# Patient Record
Sex: Female | Born: 1945 | Race: White | Hispanic: No | Marital: Married | Smoking: Never smoker
Health system: Southern US, Community
[De-identification: ages and names within clinical notes are randomized; demographics above are authoritative.]

## PROBLEM LIST (undated history)

## (undated) DIAGNOSIS — I10 Essential (primary) hypertension: Secondary | ICD-10-CM

## (undated) DIAGNOSIS — D849 Immunodeficiency, unspecified: Secondary | ICD-10-CM

## (undated) DIAGNOSIS — D899 Disorder involving the immune mechanism, unspecified: Secondary | ICD-10-CM

## (undated) DIAGNOSIS — G8929 Other chronic pain: Secondary | ICD-10-CM

## (undated) DIAGNOSIS — R350 Frequency of micturition: Secondary | ICD-10-CM

## (undated) DIAGNOSIS — Z8612 Personal history of poliomyelitis: Secondary | ICD-10-CM

## (undated) DIAGNOSIS — M5136 Other intervertebral disc degeneration, lumbar region: Secondary | ICD-10-CM

## (undated) DIAGNOSIS — Z950 Presence of cardiac pacemaker: Secondary | ICD-10-CM

## (undated) DIAGNOSIS — O020 Blighted ovum and nonhydatidiform mole: Secondary | ICD-10-CM

## (undated) DIAGNOSIS — M51369 Other intervertebral disc degeneration, lumbar region without mention of lumbar back pain or lower extremity pain: Secondary | ICD-10-CM

## (undated) DIAGNOSIS — R112 Nausea with vomiting, unspecified: Secondary | ICD-10-CM

## (undated) DIAGNOSIS — Z9889 Other specified postprocedural states: Secondary | ICD-10-CM

## (undated) DIAGNOSIS — K121 Other forms of stomatitis: Secondary | ICD-10-CM

## (undated) DIAGNOSIS — Z8489 Family history of other specified conditions: Secondary | ICD-10-CM

## (undated) DIAGNOSIS — M1990 Unspecified osteoarthritis, unspecified site: Secondary | ICD-10-CM

## (undated) DIAGNOSIS — S4290XA Fracture of unspecified shoulder girdle, part unspecified, initial encounter for closed fracture: Secondary | ICD-10-CM

## (undated) DIAGNOSIS — K589 Irritable bowel syndrome without diarrhea: Secondary | ICD-10-CM

## (undated) DIAGNOSIS — S62102A Fracture of unspecified carpal bone, left wrist, initial encounter for closed fracture: Secondary | ICD-10-CM

## (undated) DIAGNOSIS — S62101A Fracture of unspecified carpal bone, right wrist, initial encounter for closed fracture: Secondary | ICD-10-CM

## (undated) DIAGNOSIS — G629 Polyneuropathy, unspecified: Secondary | ICD-10-CM

## (undated) DIAGNOSIS — J189 Pneumonia, unspecified organism: Secondary | ICD-10-CM

## (undated) DIAGNOSIS — Z941 Heart transplant status: Secondary | ICD-10-CM

## (undated) HISTORY — DX: Personal history of poliomyelitis: Z86.12

## (undated) HISTORY — PX: SHOULDER ARTHROSCOPY: SHX128

## (undated) HISTORY — DX: Fracture of unspecified carpal bone, left wrist, initial encounter for closed fracture: S62.102A

## (undated) HISTORY — DX: Fracture of unspecified carpal bone, right wrist, initial encounter for closed fracture: S62.101A

## (undated) HISTORY — PX: COLONOSCOPY: SHX174

## (undated) HISTORY — PX: PACEMAKER INSERTION: SHX728

## (undated) HISTORY — PX: APPENDECTOMY: SHX54

## (undated) HISTORY — PX: ELBOW ARTHROPLASTY: SHX928

---

## 1975-09-26 HISTORY — PX: EAR EXAMINATION UNDER ANESTHESIA: SHX1482

## 1986-09-25 HISTORY — PX: DILATION AND CURETTAGE OF UTERUS: SHX78

## 1988-09-25 HISTORY — PX: TUBAL LIGATION: SHX77

## 1990-09-25 HISTORY — PX: SEPTOPLASTY: SUR1290

## 1996-09-25 HISTORY — PX: KNEE ARTHROSCOPY: SUR90

## 1997-12-08 ENCOUNTER — Inpatient Hospital Stay (HOSPITAL_COMMUNITY): Admission: AD | Admit: 1997-12-08 | Discharge: 1997-12-08 | Payer: Self-pay | Admitting: Obstetrics and Gynecology

## 1997-12-24 ENCOUNTER — Ambulatory Visit (HOSPITAL_BASED_OUTPATIENT_CLINIC_OR_DEPARTMENT_OTHER): Admission: RE | Admit: 1997-12-24 | Discharge: 1997-12-24 | Payer: Self-pay | Admitting: Orthopedic Surgery

## 1998-01-28 ENCOUNTER — Ambulatory Visit (HOSPITAL_COMMUNITY): Admission: RE | Admit: 1998-01-28 | Discharge: 1998-01-28 | Payer: Self-pay | Admitting: Neurosurgery

## 1998-08-16 ENCOUNTER — Ambulatory Visit (HOSPITAL_COMMUNITY): Admission: RE | Admit: 1998-08-16 | Discharge: 1998-08-16 | Payer: Self-pay | Admitting: Neurosurgery

## 1998-08-16 ENCOUNTER — Encounter: Payer: Self-pay | Admitting: Neurosurgery

## 1998-08-30 ENCOUNTER — Ambulatory Visit (HOSPITAL_COMMUNITY): Admission: RE | Admit: 1998-08-30 | Discharge: 1998-08-30 | Payer: Self-pay | Admitting: Neurosurgery

## 1998-08-30 ENCOUNTER — Encounter: Payer: Self-pay | Admitting: Neurosurgery

## 1998-09-14 ENCOUNTER — Ambulatory Visit (HOSPITAL_COMMUNITY): Admission: RE | Admit: 1998-09-14 | Discharge: 1998-09-14 | Payer: Self-pay | Admitting: Neurosurgery

## 1998-09-14 ENCOUNTER — Encounter: Payer: Self-pay | Admitting: Neurosurgery

## 1998-10-01 ENCOUNTER — Ambulatory Visit (HOSPITAL_COMMUNITY): Admission: RE | Admit: 1998-10-01 | Discharge: 1998-10-01 | Payer: Self-pay | Admitting: Neurosurgery

## 1998-10-01 ENCOUNTER — Encounter: Payer: Self-pay | Admitting: Neurosurgery

## 1998-10-15 ENCOUNTER — Ambulatory Visit (HOSPITAL_COMMUNITY): Admission: RE | Admit: 1998-10-15 | Discharge: 1998-10-15 | Payer: Self-pay | Admitting: Neurosurgery

## 1998-10-15 ENCOUNTER — Encounter: Payer: Self-pay | Admitting: Neurosurgery

## 1999-05-03 ENCOUNTER — Ambulatory Visit (HOSPITAL_COMMUNITY): Admission: RE | Admit: 1999-05-03 | Discharge: 1999-05-03 | Payer: Self-pay | Admitting: Obstetrics and Gynecology

## 1999-05-03 ENCOUNTER — Encounter: Payer: Self-pay | Admitting: Obstetrics and Gynecology

## 1999-05-05 ENCOUNTER — Encounter: Payer: Self-pay | Admitting: Obstetrics and Gynecology

## 1999-05-05 ENCOUNTER — Ambulatory Visit (HOSPITAL_COMMUNITY): Admission: RE | Admit: 1999-05-05 | Discharge: 1999-05-05 | Payer: Self-pay

## 1999-11-21 ENCOUNTER — Ambulatory Visit (HOSPITAL_COMMUNITY): Admission: RE | Admit: 1999-11-21 | Discharge: 1999-11-21 | Payer: Self-pay | Admitting: Obstetrics and Gynecology

## 1999-11-21 ENCOUNTER — Encounter: Payer: Self-pay | Admitting: Obstetrics and Gynecology

## 2000-03-02 ENCOUNTER — Ambulatory Visit (HOSPITAL_COMMUNITY): Admission: RE | Admit: 2000-03-02 | Discharge: 2000-03-02 | Payer: Self-pay | Admitting: Neurosurgery

## 2000-03-02 ENCOUNTER — Encounter: Payer: Self-pay | Admitting: Neurosurgery

## 2000-03-15 ENCOUNTER — Encounter: Payer: Self-pay | Admitting: Neurosurgery

## 2000-03-15 ENCOUNTER — Ambulatory Visit (HOSPITAL_COMMUNITY): Admission: RE | Admit: 2000-03-15 | Discharge: 2000-03-15 | Payer: Self-pay | Admitting: Neurosurgery

## 2000-03-24 ENCOUNTER — Encounter: Payer: Self-pay | Admitting: Emergency Medicine

## 2000-03-24 ENCOUNTER — Emergency Department (HOSPITAL_COMMUNITY): Admission: EM | Admit: 2000-03-24 | Discharge: 2000-03-24 | Payer: Self-pay | Admitting: Emergency Medicine

## 2000-03-29 ENCOUNTER — Ambulatory Visit (HOSPITAL_COMMUNITY): Admission: RE | Admit: 2000-03-29 | Discharge: 2000-03-29 | Payer: Self-pay | Admitting: Neurosurgery

## 2000-03-29 ENCOUNTER — Encounter: Payer: Self-pay | Admitting: Neurosurgery

## 2001-01-17 ENCOUNTER — Ambulatory Visit (HOSPITAL_BASED_OUTPATIENT_CLINIC_OR_DEPARTMENT_OTHER): Admission: RE | Admit: 2001-01-17 | Discharge: 2001-01-17 | Payer: Self-pay | Admitting: Orthopedic Surgery

## 2001-07-05 ENCOUNTER — Other Ambulatory Visit: Admission: RE | Admit: 2001-07-05 | Discharge: 2001-07-05 | Payer: Self-pay | Admitting: Obstetrics and Gynecology

## 2001-09-25 HISTORY — PX: CARDIAC CATHETERIZATION: SHX172

## 2002-06-18 ENCOUNTER — Encounter: Payer: Self-pay | Admitting: Cardiovascular Disease

## 2002-06-19 ENCOUNTER — Encounter (INDEPENDENT_AMBULATORY_CARE_PROVIDER_SITE_OTHER): Payer: Self-pay | Admitting: *Deleted

## 2002-06-19 ENCOUNTER — Encounter: Payer: Self-pay | Admitting: Thoracic Surgery (Cardiothoracic Vascular Surgery)

## 2002-06-19 ENCOUNTER — Inpatient Hospital Stay (HOSPITAL_COMMUNITY): Admission: EM | Admit: 2002-06-19 | Discharge: 2002-07-06 | Payer: Self-pay | Admitting: Emergency Medicine

## 2002-06-20 ENCOUNTER — Encounter: Payer: Self-pay | Admitting: Thoracic Surgery (Cardiothoracic Vascular Surgery)

## 2002-06-21 ENCOUNTER — Encounter: Payer: Self-pay | Admitting: Thoracic Surgery (Cardiothoracic Vascular Surgery)

## 2002-06-22 ENCOUNTER — Encounter: Payer: Self-pay | Admitting: Thoracic Surgery (Cardiothoracic Vascular Surgery)

## 2002-06-23 ENCOUNTER — Encounter (INDEPENDENT_AMBULATORY_CARE_PROVIDER_SITE_OTHER): Payer: Self-pay | Admitting: Cardiology

## 2002-06-23 ENCOUNTER — Encounter: Payer: Self-pay | Admitting: Thoracic Surgery (Cardiothoracic Vascular Surgery)

## 2002-06-24 ENCOUNTER — Encounter: Payer: Self-pay | Admitting: Thoracic Surgery (Cardiothoracic Vascular Surgery)

## 2002-06-25 ENCOUNTER — Encounter: Payer: Self-pay | Admitting: Thoracic Surgery (Cardiothoracic Vascular Surgery)

## 2002-06-26 ENCOUNTER — Encounter: Payer: Self-pay | Admitting: Thoracic Surgery (Cardiothoracic Vascular Surgery)

## 2002-06-27 ENCOUNTER — Encounter: Payer: Self-pay | Admitting: Thoracic Surgery (Cardiothoracic Vascular Surgery)

## 2002-06-28 ENCOUNTER — Encounter: Payer: Self-pay | Admitting: Thoracic Surgery (Cardiothoracic Vascular Surgery)

## 2002-06-29 ENCOUNTER — Encounter: Payer: Self-pay | Admitting: Thoracic Surgery (Cardiothoracic Vascular Surgery)

## 2002-06-30 ENCOUNTER — Encounter: Payer: Self-pay | Admitting: Cardiothoracic Surgery

## 2002-06-30 ENCOUNTER — Encounter (INDEPENDENT_AMBULATORY_CARE_PROVIDER_SITE_OTHER): Payer: Self-pay | Admitting: Cardiology

## 2002-06-30 ENCOUNTER — Encounter: Payer: Self-pay | Admitting: Thoracic Surgery (Cardiothoracic Vascular Surgery)

## 2002-07-01 ENCOUNTER — Encounter: Payer: Self-pay | Admitting: Thoracic Surgery (Cardiothoracic Vascular Surgery)

## 2002-07-01 ENCOUNTER — Encounter (INDEPENDENT_AMBULATORY_CARE_PROVIDER_SITE_OTHER): Payer: Self-pay | Admitting: Specialist

## 2002-07-02 ENCOUNTER — Encounter: Payer: Self-pay | Admitting: Thoracic Surgery (Cardiothoracic Vascular Surgery)

## 2002-07-03 ENCOUNTER — Encounter: Payer: Self-pay | Admitting: Thoracic Surgery (Cardiothoracic Vascular Surgery)

## 2002-07-04 ENCOUNTER — Encounter (INDEPENDENT_AMBULATORY_CARE_PROVIDER_SITE_OTHER): Payer: Self-pay | Admitting: *Deleted

## 2002-07-04 ENCOUNTER — Encounter: Payer: Self-pay | Admitting: Critical Care Medicine

## 2002-07-05 ENCOUNTER — Encounter: Payer: Self-pay | Admitting: Thoracic Surgery (Cardiothoracic Vascular Surgery)

## 2002-07-06 ENCOUNTER — Encounter: Payer: Self-pay | Admitting: Thoracic Surgery (Cardiothoracic Vascular Surgery)

## 2002-09-25 DIAGNOSIS — Z941 Heart transplant status: Secondary | ICD-10-CM

## 2002-09-25 HISTORY — DX: Heart transplant status: Z94.1

## 2002-11-24 HISTORY — PX: HEART TRANSPLANT: SHX268

## 2003-01-13 DIAGNOSIS — Z950 Presence of cardiac pacemaker: Secondary | ICD-10-CM

## 2003-01-13 HISTORY — DX: Presence of cardiac pacemaker: Z95.0

## 2003-05-28 ENCOUNTER — Other Ambulatory Visit: Admission: RE | Admit: 2003-05-28 | Discharge: 2003-05-28 | Payer: Self-pay | Admitting: Obstetrics and Gynecology

## 2004-02-01 ENCOUNTER — Encounter: Admission: RE | Admit: 2004-02-01 | Discharge: 2004-02-01 | Payer: Self-pay | Admitting: Orthopedic Surgery

## 2004-02-10 ENCOUNTER — Encounter: Admission: RE | Admit: 2004-02-10 | Discharge: 2004-02-10 | Payer: Self-pay | Admitting: Orthopedic Surgery

## 2004-07-06 ENCOUNTER — Encounter: Admission: RE | Admit: 2004-07-06 | Discharge: 2004-07-06 | Payer: Self-pay | Admitting: Orthopedic Surgery

## 2005-03-03 ENCOUNTER — Encounter: Admission: RE | Admit: 2005-03-03 | Discharge: 2005-03-03 | Payer: Self-pay | Admitting: Orthopedic Surgery

## 2006-09-25 HISTORY — PX: WRIST ARTHROPLASTY: SHX1088

## 2007-12-27 ENCOUNTER — Encounter: Admission: RE | Admit: 2007-12-27 | Discharge: 2007-12-27 | Payer: Self-pay | Admitting: Orthopedic Surgery

## 2009-12-27 ENCOUNTER — Encounter: Admission: RE | Admit: 2009-12-27 | Discharge: 2009-12-27 | Payer: Self-pay | Admitting: Orthopedic Surgery

## 2011-02-10 NOTE — Discharge Summary (Signed)
NAME:  Cassie Harvey, Cassie Harvey                           ACCOUNT NO.:  [id number]   MEDICAL RECORD NO.:  [id number]                   PATIENT TYPE:  INP   LOCATION:  2311                                 FACILITY:  [facility]   PHYSICIAN:  Salvatore Decent. Cornelius Moras, M.D.              DATE OF BIRTH:  1947/11/64   DATE OF ADMISSION:  06/17/2002  DATE OF DISCHARGE:  07/06/2002                                 DISCHARGE SUMMARY   ADMISSION DIAGNOSIS:  Unstable angina.   PAST MEDICAL HISTORY:  1. Denies history of heart disease, diabetes, thyroid disease or pulmonary     disease.  2. Rotator cuff injury.  3. Knee surgery.  4. Right elbow surgery.   ALLERGIES:  No known drug allergies.   PROCEDURES:  1. Coronary catheterization with attempted percutaneous intervention on     September 23.  2. Emergent coronary artery bypass grafting on September 23.  3. Balloon pump during initial cardiac catheterization and this was removed     approximately two days postoperatively, reinserted and removed again     several days later.  The balloon pump was again inserted today on July 06, 2002.   HOSPITAL COURSE:  The entire hospital chart has been copied and will be  transferred along with the patient to [facility].  The patient  is a 65 year old, Caucasian female with no previous cardiac history admitted  to [facility] on the evening of September 23.  Her admitting  diagnosis was of acute onset of Class IV unstable angina.  She was ruled out  for acute MI.  She was brought to the cardiac catheterization lab on the  morning of September 25, by Dr. Susa Griffins.  Diagnostic  catheterization reveals single vessel coronary artery disease including a 7%  stenosis of the LAD with high-grade osteal stenosis of the large second  diagonal branch.  During attempted percutaneous coronary intervention, the  patient subsequently deteriorated and suffered acute arrest.  She was  promptly  resuscitated and cardioverted.  Repeat cardiac catheterization  revealed occlusion of the left main coronary with what appeared to be acute  thrombus.  No obvious evidence of acute coronary dissection.  Intra-aortic  balloon pump was placed.  Emergent cardiac consultation was requested and  the patient was promptly evaluated by Dr. Cornelius Moras.  His opinion was that she  needed to be brought emergently to the operating room and this was arranged.   The patient underwent the following surgical procedures:  Emergent median  sternotomy for CABG x3.  Grafts placed at the time of the procedure:  Sequential vein graft to the LAD and diagonal-2, saphenous vein graft to the  circumflex marginal branch; aortotomy for extraction of left main coronary  thrombus.  She was weaned from the cardiopulmonary bypass requiring small  amounts of dopamine.  She tolerated this procedure easily and was  transferred in  stable condition to the SICU.  The intra-aortic ballon pump  was functioning on transfer to the unit.   In the immediate postoperative period, the patient remained hemodynamically  stable.  She did pretty well.  She did require epinephrine and milrinone  drips.  She was able to be extubated on postop day #1.  Over the next  several days, she remained stable on epinephrine and milrinone.  On postop  day #4, she developed acute respiratory failure due to hypoxia.  The intra-  aortic balloon pump was removed on postop day #2.   On September 29, a 2D echocardiogram was performed.  This was negative for a  pericardial effusion.  This showed severe LV dysfunction with EF 20-30%, RV  not dilated only mild MR with mild to moderate TR.  On September 30, IAVP  was replaced via the left femoral artery.   At this time, the patient had developed a high fever with hypotension.  Pulmonary and critical care medicine team was consulted to determine if  there is any rule for short course of steroids for SIRS.   Pulmonary  recommendation was to continue with antibiotics, Zosyn and vancomycin.  Check DIC panel and add Xopenex.  She developed a left pleural effusion and  a left chest tube was placed at the bed side without complications.   By October 1, the patient's condition was noted to be much improved in the  prior 24 hours.  She was afebrile in normal sinus rhythm with a cardiac  index of 2.0/2.4.  PA pressure is about 46/22 with a wedge of about 17.  Other laboratory studies were improving.  Chest x-ray shows increased  diffuse opacity.  Cultures were all negative to date.  It is still unclear  about the source of her fevers.  Her hemodynamics are improving.  Plan is to  continue a slow wean from the epinephrine and continue antibiotics and IAVP.  Over the next several days, the patient remained overall stable, although  her LV function is still a major problem.  Throughout this process, her  renal function has remained stable.  Hematologically, she is stable.  On the  morning of October 3, neurologically she is sedated, but will open her eyes  and responds appropriately to questions.  The IAVP was removed on October 5.   The morning of October 6, hemodynamically she was stable since the balloon  was discontinued.  White blood cells are increasing, however, it was noted  that she was receiving steroids for adrenal insufficiency.  All cultures  remain negative so far.  She remains stable on the ventilator with copious  secretions.  Chest x-ray is stable with moderate, bilateral pulmonary  opacities.  Renal function remains stable as well as hematologic status.  On  October 6, infectious disease consult was obtained with Dr. Artis Flock to  determine the source of her fevers.   CT scan of the chest was done on October 7, and revealed some pulmonary  infiltrates in bilateral lower lobe consistent with pneumonia.  There were no obvious signs of infection.  Abdominal/pelvic CT revealed small splenic   infarcts and several small bilateral renal infarcts consistent with embolic  phenomenon.  This could have resulted from the IAVP insertion, although this  could be a cardiac source.  She will remain on heparin.  These emboli could  be contributed to her fevers.   On October 7, on exam, the patient's left foot was noted to have some  symptoms  of ischemia.  Vascular surgery consult was obtained.  After  examination of the patient, Dr. Arbie Cookey recommended left femoral arterial  embolectomy.  This was performed on October 7, with a left, iliofemoral  thrombectomy.  She tolerated the procedure well and was transferred back to  the SICU.   On the morning of October 9, the patient appeared overall stable.  She is  awake, alert and looked comfortable on the T-bar.  We felt that she would be  ready for a trial extubation instead.  She was extubated successfully and by  the evening of October 9, she was breathing comfortably with stable vital  signs and laboratory studies.  Unfortunately, by the next morning, her  respiratory status had deteriorated with decreased O2 saturations and  increase in respiratory rate.  The patient continues to be in hear failure.  Dr. Orvan July plan was to increase the milrinone to aide in her failure.  A  followup echocardiogram was performed on October 10, and there was no  significant improvement in her LV function.  Dr. Cornelius Moras noted that she  remained fairly stable over the course of the day, but his concern is that  she may require reintubation over the next 12-24 hours.  Her hypoxemia and  respiratory insufficiency is likely primarily related to CHF rather than  pneumonia and/or ARDS.  BNP remained greater than 3200 today.  The plan was  that if she did not show improvement, we would transfer her to the  transplant center.  This option was discussed with the patient and her  husband.  They understand the implications and wish to think things over.   The morning of  October 12, the patient's condition is essentially the same.  Dr. Cornelius Moras began to discuss the issues with the patient and her husband and  they are agreeable for the plans to transfer further treatment to [facility] under the care of Dr. Markus Daft.   In anticipation of transferring to [facility], the patient was  reintubated.  Shortly after intubation, the patient's heart rate dropped and  she developed a code blue situation.  Aggressive attempts at resuscitation  were initiated including, but not limited to, multiple  electrocardioversions, epinephrine boluses starting with epinephrine drip  and reinsertion of an intra-aortic balloon pump.  Currently, her rhythm is  paced with a heart rate about 90.  She is on milrinone, amiodarone and  magnesium.  Her mental status is uncertain at this time.  She does have some  decreased urine output at this time.  Multiple laboratory studies are being repeated and copies will be sent along with the patient.  Plans are  uncertain at this time whether she will in fact be transferred to [facility] or remain here at [facility] for further  treatment.       Toribio Harbour, R.N.                  Salvatore Decent. Cornelius Moras, M.D.    CTK/MEDQ  D:  07/06/2002  T:  07/06/2002  Job:  [id number]

## 2011-02-10 NOTE — Op Note (Signed)
NAME:  Cassie Harvey, Cassie Harvey                           ACCOUNT NO.:  [id number]   MEDICAL RECORD NO.:  [id number]                   PATIENT TYPE:  INP   LOCATION:  2311                                 FACILITY:  [facility]   PHYSICIAN:  Salvatore Decent. Cornelius Moras, M.D.              DATE OF BIRTH:  01/01/1946   DATE OF PROCEDURE:  06/19/2002  DATE OF DISCHARGE:                                 OPERATIVE REPORT   PREOPERATIVE DIAGNOSIS:  Acute left main coronary artery occlusion with  acute evolving myocardial infarction, cardiogenic shock, status post  ventricular fibrillation arrest, status post attempted percutaneous coronary  intervention.   POSTOPERATIVE DIAGNOSIS:  Acute left main coronary artery occlusion with  acute evolving myocardial infarction, cardiogenic shock, status post  ventricular fibrillation arrest, status post attempted percutaneous coronary  intervention.   PROCEDURE:  Emergency median sternotomy for coronary artery bypass grafting  x3 (saphenous vein graft to second diagonal vein branch and sequential  saphenous vein graft to distal left anterior descending coronary artery,  saphenous vein graft to saphenous marginal branch), and aortotomy for  extraction of left main coronary artery thrombus.   SURGEON:  Salvatore Decent. Cornelius Moras, M.D.   ASSISTANT:  Debby Freiberg. Thomasena Edis, P.A.   ANESTHESIA:  General.   BRIEF CLINICAL NOTE:  The patient is a 65 year old white female with no  previous cardiac history who was admitted to the hospital on the evening of  06/17/02 with acute onset of class IV unstable angina.  She ruled out for  acute myocardial infarction.  She was brought to the cardiac catheterization  lab on the morning of 06/19/02 by Richard A. Alanda Amass, M.D., for elective  cardiac catheterization.  Diagnostic catheterization revealed single-vessel  coronary artery disease including 70% stenosis of the left anterior  descending coronary with high-grade ostial stenosis of the large second  diagonal branch.  An attempted percutaneous coronary intervention was  commenced.  The patient suddenly deteriorated and suffered V-fib arrest.  She was promptly resuscitated and cardioverted.  Repeat cardiac  catheterization revealed occlusion of the left main coronary artery with  what appears to be acute thrombus and no obvious evidence of acute coronary  dissection.  From then a variety of maneuvers were performed by Dr.  Alanda Amass in an attempt to re-establish antegrade blood flow.  An intra-  aortic balloon pump was placed during this series of events.  The patient  suffered numerous repeated episodes of ventricular tachycardia and V-fib  arrest requiring cardioversion.  Ultimately she was intubated.  Emergency  cardiac surgical consultation was requested.   Upon my arrival in the cardiac catheterization lab, the patient was just in  the process of being intubated, and Dr. Alanda Amass and his colleagues were  working in an attempt to re-establish blood flow through the left coronary  circulation.  The operating room was promptly notified regarding the need  for emergency surgical intervention and prompt transportation to  operating  room for revascularization was  recommended.  The patient's husband was  counseled briefly regarding the sequence of events and the need for  emergency intervention.   DESCRIPTION OF PROCEDURE:  The patient was transported to the operating room  and placed in the supine position on the operating table.  Adequate general  endotracheal anesthesia was verified by the anesthesia team under the care  and direction of Quita Skye. Krista Blue, M.D.  Large-bore central venous access was  obtained through the right internal jugular approach by Dr. Krista Blue.  The  patient's anterior chest, abdomen, both groins, and both lower extremities  were promptly prepared and draped in a sterile manner.  Intravenous  antibiotics were administered.   A median sternotomy incision is  performed.  The patient is heparinized.  The  pericardium is opened.  The ascending aorta and the right atrial appendage  are rapidly cannulated for cardiopulmonary bypass.  Cardiopulmonary bypass  is begun.  Cardiopulmonary bypass was commenced in less than 15 minutes from  the time of onset of the surgical procedure and less than 30 minutes from  the time of transport of the patient to the operating room.  During this  period of time the patient did not suffer any repeated episodes of  arrhythmias and maintained a systolic blood pressure averaging 90 mmHg  systolic as measured by the intra-aortic balloon pump.   Following onset of cardiopulmonary bypass, the surface of the heart is  inspected.  The entire anterolateral wall of the left ventricle appears  dusky, consistent with acute myocardial infarction.  The epicardial coronary  arteries all appear normal with the exception of one palpable plaque that  could be appreciated at the site of bifurcation of the left anterior  descending coronary artery at the takeoff of the second diagonal branch.  Simultaneously during the period at which cannulation and cardiopulmonary  bypass was commenced, a portion of saphenous vein was obtained from the  patient's right lower leg through a longitudinal incision.  The saphenous  vein is trimmed to appropriate length.  A cardioplegia catheter is placed in  the ascending aorta and a retrograde cardioplegia catheter is placed through  the right atrium into the coronary sinus.   The patient is cooled to 32 degrees systemic temperature.  The aortic  crossclamp is applied and cardioplegia is delivered initially in an  antegrade fashion through the aortic root.  With antegrade administration of  cardioplegia, satisfactory diastolic arrest is achieved, although myocardial  cooling is quite slow.  Supplemental cardioplegia is administered retrograde  through the coronary sinus catheter, and rapid cooling is  achieved.  Repeat doses of cardioplegia are administered intermittently throughout the  crossclamp portion of the operation both antegrade through subsequently-  placed vein grafts and retrograde through the coronary sinus catheter to  maintain septal temperature below 15 degrees Centigrade.  Iced saline slush  is applied for topical hypothermia.  The following distal coronary  anastomoses are performed:  (1) The circumflex marginal branch is grafted  with a saphenous vein graft in an end-to-side fashion.  This coronary  measures 1.5 mm in diameter and is of good quality.  (2) The second diagonal  branch off the left anterior descending coronary artery is grafted with a  saphenous vein graft in a side-to-side fashion using the natural Y in the  vein conduit.  This coronary artery measures 1.5 mm and is of good quality.  (3) The distal left anterior descending coronary artery is grafted using  a  sequential saphenous vein graft off of the vein placed in the diagonal  branch.  This coronary measures 1.6 mm in diameter and is of good quality.   Two punch aortotomy holes are placed in the ascending aorta for subsequent  placement of the proximal vein grafts.  A portion of clot is appreciated in  the lumen of the aorta at the site of aortotomy punch.  Subsequently, a  small transverse oblique aortotomy incision is made below the site for the  proximal anastomoses.  The aortic root is examined.  A 1.0 cm plug of  thrombus is removed and appears to be emanating from the left main coronary  artery.  This was sent to pathology for review.  The left main coronary  ostium is examined.  There is a mild amount of luminal trauma in the area,  although there is no sign of coronary dissection.  A #3 Fogarty balloon  thrombectomy catheter is gently passed in the left main coronary artery and  removed.  No further thrombus material is extracted.  The aortic root is  irrigated with copious iced saline  solution.  The proximal saphenous vein  grafts are performed in the usual fashion and the aortotomies closed with a  standard two-layer closure with running 4-0 Prolene suture.  Just prior to  completion of closure of the last proximal vein graft, the patient is placed  in deep Trendelenburg position.  One final dose of warm retrograde hot-shot  cardioplegia is administered.  All air is evacuated from the aortic root.  The aortic crossclamp is removed after a total crossclamp time of 59  minutes.   The heart begins to beat spontaneously without need for cardioversion.  The  retrograde cardioplegia catheter is removed.  All proximal and distal  anastomoses are inspected for hemostasis and appropriate graft orientation.  Epicardial pacing wires are affixed to the right ventricular outflow tract  and to the right atrial appendage.  The patient is rewarmed to greater than  37 degrees Centigrade temperature.  Normal sinus rhythm resumes  spontaneously.  A low-dose dopamine infusion is begun and amiodarone infusion is continued.   The patient is weaned from cardiopulmonary bypass without difficulty.  The  patient's rhythm at separation from bypass is normal sinus rhythm.  Total  cardiopulmonary bypass time for the operation is 86 minutes.  After  separation from bypass there appears to be moderate global hypokinesis,  particularly involving the anterior and lateral wall of the left ventricle.  Transesophageal echocardiogram is then performed by Dr. Krista Blue.  This  confirms the presence of essentially akinesis of the anterior and lateral  walls of the left ventricle with hyperdynamic inferior wall and base of the  heart.  Low-dose epinephrine and milrinone infusions are begun.  The intra-  aortic balloon pump is continued and timed appropriately.  The venous and  arterial cannulae are removed uneventfully.  Protamine is administered to  reverse the anticoagulation.   The mediastinum and the  right chest are irrigated with saline solution  containing vancomycin.  Meticulous surgical hemostasis is ascertained.  There is severe coagulopathy appreciated.  The patient is kept in the  operating room and resuscitated with blood products, including two 10-packs  of adult platelets and four units of fresh frozen plasma.  Gradually the  coagulopathy appears to slowly improve.  An exhaustive search for sites of  mechanical bleeding is performed.  A Swan-Ganz catheter is placed by Dr.  Krista Blue.  Initial cardiac outputs  after placement of the Swan-Ganz catheter  average in the neighborhood of 1.8 L/min.  However, they gradually continue  to improve, and cardiac index averages greater than 2 prior to transport.   The mediastinum and the right chest are drained with three chest tubes  placed through separate stab incisions inferiorly.  The median sternotomy is  closed in a routine fashion.  The right lower extremity incisions are closed  in multiple layers in routine fashion.  All skin incisions are closed with  subcuticular skin closure.  Prior to closure the patient did develop one  brief episode of atrial fibrillation, for which she was promptly  cardioverted.  Otherwise her cardiac rhythm remained sinus rhythm throughout  the remainder of the procedure.  The patient was briefly monitored in the  operating room and remained stable for another 30 minutes.  She was  subsequently transported to the surgical intensive care unit in satisfactory  condition.  There are no intraoperative complications.  All sponge,  instrument, and needle counts are verified correct at completion of the  operation.  The patient was transfused a total of six units of packed red  blood cells during the procedure, including four units during  cardiopulmonary bypass and two units after separation from bypass.                                               Salvatore Decent. Cornelius Moras, M.D.    CHO/MEDQ  D:  06/19/2002  T:   06/20/2002  Job:  [id number]  cc:   Gerlene Burdock A. Alanda Amass, M.D.  [phone]  [place]  [place] [address]  Fax: [phone]

## 2011-02-10 NOTE — Discharge Summary (Signed)
NAME:  Cassie Harvey, Cassie Harvey                           ACCOUNT NO.:  [id number]   MEDICAL RECORD NO.:  [id number]                   PATIENT TYPE:  INP   LOCATION:  2311                                 FACILITY:  [facility]   PHYSICIAN:  Salvatore Decent. Cornelius Moras, M.D.              DATE OF BIRTH:  04/23/1946   DATE OF ADMISSION:  06/18/2002  DATE OF DISCHARGE:  07/06/2002                                 DISCHARGE SUMMARY   ADMISSION DIAGNOSIS:  Unstable angina.   PAST MEDICAL HISTORY:  1. Multiple orthopaedic procedures, including knee surgery, elbow surgery,     bilateral surgical symptoms of rotator cuff tear.  2. She also has a history of polio of the left lower extremity, Dr. Channing Mutters here     in [place] follows her.   ALLERGIES:  No known drug allergies.   HOSPITAL COURSE:  Briefly, the patient is a 65 year old Caucasian female  with no previous cardiac history who is admitted to [facility] the  evening of 06/17/02, with acute onset of unstable angina.  She was ruled out  for acute myocardial infarction.  She was brought electively to the cardiac  catheterization laboratory on the morning of 06/19/02, by Dr. Susa Griffins.  Diagnostic catheterization revealed single vessel coronary  artery disease, including 70% stenosis of left anterior descending artery  with high grade ostial stenosis of a large second diagonal branch.  During  an attempt at percutaneous coronary intervention, the patient suddenly  deteriorated and suffered a ventricular fibrillation arrest.  She was  promptly resuscitated and cardioverted.  Repeat cardiac catheterization  revealed occlusion of the left main coronary.  An intra-aortic balloon pump  was placed.  During this time, she suffered numerous repeat episodes of  ventricular tachycardia and ventricular fibrillation requiring  cardioversion.  She was openly intubated in the cath lab.  Emergency  surgical consultation with CVTS was requested.  She was evaluated  promptly  by Dr. Tressie Stalker.  His recommendation was to proceed emergently to the  OR for surgical intervention.  Findings at the time of surgery included a  thrombus in her left main coronary artery.  Surgical procedure was  performed, emergency median sternotomy with coronary artery bypass grafting  x3.  Grafts placed at the time of procedure were a saphenous vein graft in a  sequential fashion to the left anterior descending and diagonal artery, and  saphenous vein graft to the circumflex marginal.  Exploration of aorta with  extraction of left main coronary thrombus.  She came off cardiopulmonary  bypass with the intra-aortic balloon pump and low dose Dopamine in normal  sinus rhythm.  Epinephrine and Milrinone were added later due to severe left  ventricular function.  TEE after separation from bypass with severe  hypokinesis, akinesis of the left anterior descending, and left circumflex  distribution, consistent with acute myocardial infarction ____________.  She  did have severe  coagulopathy after reversal of heparin.  She was transferred  in stable condition to the SICU.  She was initially hemodynamically stable  after surgery, and on postoperative day #1, plans were to wean vent for  extubation.  Discontinue sheath to the left groin.     Toribio Harbour, R.N.                  Salvatore Decent. Cornelius Moras, M.D.    CTK/MEDQ  D:  07/06/2002  T:  07/06/2002  Job:  [id number]

## 2011-02-10 NOTE — Op Note (Signed)
NAME:  Cassie Harvey, Cassie Harvey                           ACCOUNT NO.:  [id number]   MEDICAL RECORD NO.:  [id number]                   PATIENT TYPE:  INP   LOCATION:  2311                                 FACILITY:  [facility]   PHYSICIAN:  Quita Skye. Krista Blue, M.D.               DATE OF BIRTH:  December 07, 1945   DATE OF PROCEDURE:  07/06/2002  DATE OF DISCHARGE:  07/06/2002                                 OPERATIVE REPORT   PROCEDURE:  Intubation.   INDICATION:  The patient is a 65 year old white female who is status post  massive myocardial infarction and coronary artery bypass grafting.  The  patient was initially on a balloon pump but had been extubated and was being  treated by her physicians which include Dr. Jayme Cloud, Dr. Domingo Sep, and Dr.  Cornelius Moras postoperatively.  The patient had a low cardiac output, low EF and was  felt to be a candidate for cardiac bypass.  The patient was scheduled for  transport to [facility] for the possibility of this procedure when anesthesia  was consulted to perform an intubation for the patient's safety during  transport.  The patient had had marginal blood gases and was mildly  hypotensive with a blood pressure in 80-85 systolic range/40-50 diastolic  with a heart rate around 70.  The patient discussed the indications for the  intubation with Dr. Cornelius Moras and myself.  Dr. Jayme Cloud was also involved in this  decision and it was felt that for her safety during transport it would be  best to secure her airway.  A discussion also occurred with her husband and  they agreed to this plan of action.   PROCEDURE IN DETAIL:  For the intubation, Dr. Jayme Cloud was at the bedside  and the patient was given 100% oxygen by face mask.  She was moderately  anxious and had a small oral opening so the decision was made to administer  some Versed and 2 mg of Versed were titrated in over 10 minutes and the  patient tolerated this medication well maintaining her blood pressure in the  85 range but  there was some sedation noted from the drug.  Following this 2  mg of morphine were then given and again the patient became a little more  somnolent but was able to respond to our requests for her to open her eyes  and take a deep breath.  Her saturation at this time was 100% and although  she did have a history of drinking some clear liquids during the day of this  procedure we decided to go ahead and administer etomidate 8 mg and  succinylcholine 80 mg.  The patient became unconscious, was mask ventilated  and direct laryngoscopy occurred with a MAC-3 blade and a 7.5 endotracheal  tube was inserted with a mild amount of difficulty.  There was some poor  visualization grade 3 but the tube was inserted on  the first attempt and  there was positive CO2 when the tube was secured at 22 cm at the lip.  There  was positive CO2 and bilateral breath sounds were heard.  The patient was  then being hand ventilated and over the following 10-15 minutes the patient  became bradycardic and eventually asystolic.  At this time chest  compressions were started and ACLS protocol was initiated.  Dr. Jayme Cloud was  at the bedside and directed the ACLS protocol at this time.  The patient had  good pressures by chest compression and further interventions were required  which were done under the direction of the cardiologists and the critical  care medicine physicians.                                               Quita Skye Krista Blue, M.D.    JDS/MEDQ  D:  07/06/2002  T:  07/08/2002  Job:  [id number]

## 2011-02-10 NOTE — Consult Note (Signed)
   NAME:  ROSEMARIA, INABINET                             ACCOUNT NO.:  [id number]   MEDICAL RECORD NO.:  [id number]                   PATIENT TYPE:   LOCATION:                                       FACILITY:   PHYSICIAN:  Shan Levans, M.D. [facility]            DATE OF BIRTH:  10/03/1945   DATE OF CONSULTATION:  06/30/2002  DATE OF DISCHARGE:                                   CONSULTATION   INDICATIONS FOR PROCEDURE:  Bilateral pulmonary infiltrates and fever rule  out ventilator associated pneumonia.   PREOPERATIVE MEDICATION:  Versed 1 mg IV push.   ANESTHESIA:  1% Xylocaine local.   PROCEDURE:  The bedside Olympus bronchoscope was introduced into the  endotracheal tube. The airways were visualized and showed diffuse tracheal  bronchitis with thin, clear, watery secretions. Attention was then paid to  the left lower lobe lateral segment. Bronchial lavage was performed and 90  cc volume infused, 50 cc volume was returned. No biopsies were obtained.   COMPLICATIONS:  None.   IMPRESSION:  Bilateral ventilator associated pneumonia in a patient with  status post emergent bypass surgery.   RECOMMENDATIONS:  Follow-up microbiologic data.                                               Shan Levans, M.D. [facility]    PW/MEDQ  D:  06/30/2002  T:  07/01/2002  Job:  [id number]   cc:   Salvatore Decent. Cornelius Moras, M.D.  [address]  [place]  [place] [address]  Fax: [phone]

## 2011-02-10 NOTE — Op Note (Signed)
[place]  Patient:    Cassie Harvey, Cassie Harvey                        MRN: [id number] Proc. Date: 01/17/01 Adm. Date:  [id number] Attending:  Colbert Ewing                           Operative Report  PREOPERATIVE DIAGNOSES: 1. Chronic impingement, right shoulder, with partial-thickness tearing,    rotator cuff. 2. Grade 4 degenerative joint disease, acromioclavicular joint.  POSTOPERATIVE DIAGNOSES: 1. Chronic impingement, right shoulder, with partial-thickness tearing,    rotator cuff. 2. Grade 4 degenerative joint disease, acromioclavicular joint. 3. Anterior labrum tear.  PROCEDURES: 1. Right shoulder examination under anesthesia, arthroscopy, with debridement    of rotator cuff and labrum. 2. Arthroscopic acromioplasty, bursectomy, coracoacromial ligament release,    right shoulder. 3. Excision, distal clavicle, right shoulder.  SURGEON:  Loreta Ave, M.D.  ASSISTANT:  Arlys John D. Petrarca, P.A.-C.  ANESTHESIA:  General.  ESTIMATED BLOOD LOSS:  Minimal.  SPECIMENS:  None.  CULTURES:  None.  COMPLICATIONS:  None.  DRESSING:  Soft compressive.  DESCRIPTION OF PROCEDURE:  Patient brought to the operating room and placed on the operating table in the supine position.  After adequate anesthesia had been obtained, right shoulder examined.  Full motion, good stability.  Placed in the beach chair position on the shoulder positioner, prepped and draped in the usual sterile fashion.  Three standard arthroscopic portals, anterior, posterior, lateral.  Shoulder entered with a blunt obturator, distended, and inspected.  Glenohumeral joint had excellent cartilage.  Little fraying anterior labrum, easily debrided.  No instability.  Capsular and ligamentous structures intact.  Biceps tendon and biceps anchor intact.  Very superficial partial-thickness tearing crescent portion of the supraspinatus, debrided to a stable surface.  No  full-thickness tears or structural tears.  Cannula redirected subacromially.  Chronic impingement, type 2 acromion, and abrasive changes on the top of the cuff.  Top of the cuff debrided.  Acromioplasty to a type 1 acromion with shaver and high-speed bur.  CA ligament released and hemostasis with cautery.  Distal clavicle with grade 4 changes also contributing to impingement.  Lateral centimeter sharply resected with shaver and high-speed bur.  Adequate CA decompression, clavicle excision, acromioplasty confirmed viewing from all portals.  Instruments and fluid removed.  Portals, shoulder, and bursa injected with Marcaine.  Portals closed with 4-0 nylon.  Sterile compressive dressing and sling applied.  Anesthesia reversed, brought to recovery room.  Tolerated the surgery well with no complications. DD:  01/17/01 TD:  01/18/01 Job: [id number]

## 2011-02-10 NOTE — Op Note (Signed)
NAME:  Cassie Harvey, Cassie Harvey                           ACCOUNT NO.:  [id number]   MEDICAL RECORD NO.:  [id number]                   PATIENT TYPE:  INP   LOCATION:  2311                                 FACILITY:  [facility]   PHYSICIAN:  Quita Skye. Krista Blue, M.D.               DATE OF BIRTH:  1946/04/08   DATE OF PROCEDURE:  DATE OF DISCHARGE:                                 OPERATIVE REPORT   DIAGNOSIS:  Coronary artery disease.   PROCEDURE:  Transesophageal echocardiogram.   The patient is a 65 year old, white female who was undergoing intervention  in the cardiac catheterization lab when she developed ventricular  tachycardia and the decision was made to do emergent coronary artery bypass  graft.  While in the catheterization lab, the patient was intubated and  brought over the operating room where she was essentially unresponsive  secondary to medications and intubated.  The patient underwent placement on  the operating room table and surgery was started.  Following separation from  the cardiopulmonary bypass machine, the patient maintained low blood  pressure.  Because we were unable to take the time to insert a Swan due to  her frequent ventricular tachycardia, we decided to insert a transesophageal  echocardiogram probe and do a transesophageal echocardiogram.  Overall  images of the heart demonstrated there was no evidence of pericardial  effusion.  The right atrium appeared to be normal in size to mildly dilated.  There was no evidence of atrioseptal defect.  The tricuspid valve had 2+  regurgitation.  The right ventricle was hypokinetic and slightly dilated.  The left atrium appeared to be normal in size.  There was mild mitral  regurgitation and the left ventricle had hypokinetic or akinetic anterior  wall.  The left ventricular function was severely depressed.  The aortic  valve had three leaflets and was normal in structure.  The patient had also  appeared to be slightly hypovolemic,  so specific inotropes were instituted  and volume expansion was started.  A transesophageal probe was then used as  a monitor for the patient's cardiac function and subsequently a pulmonary  artery catheter was placed in the patient.  The transesophageal probe was  carefully removed, although it was noted that on her arrival to the  operating room the patient had blood in oropharynx which was presumably due  to a combination of the intubation done in the catheterization lab and the  medications that she received in the catheterization lab to prevent blood  clotting.  The transesophageal probe was carefully inserted and removed,  although there continued to be some blood in the oropharynx.  The patient  did receive a bite block, which was inserted carefully to ensure that the  tongue and ellipse were free and this was also removed when the case was  finished.  The patient was then taken to the SICU in critical condition.  Quita Skye Krista Blue, M.D.    JDS/MEDQ  D:  06/19/2002  T:  06/22/2002  Job:  [id number]

## 2011-02-10 NOTE — Cardiovascular Report (Signed)
NAME:  CHAUNTAE, HULTS                           ACCOUNT NO.:  [id number]   MEDICAL RECORD NO.:  [id number]                   PATIENT TYPE:  OBV   LOCATION:  2022                                 FACILITY:  [facility]   PHYSICIAN:  Richard A. Alanda Amass, M.D.          DATE OF BIRTH:  15-Sep-1946   DATE OF PROCEDURE:  06/19/2002  DATE OF DISCHARGE:                              CARDIAC CATHETERIZATION   PROCEDURE:  Retrograde central aortic catheterization, selective coronary  angiography by Judkins technique, pre and post intracoronary nitroglycerin  administration, left ventricular angiogram, RAO, LAO projections,  subselective internal mammary artery, abdominal aortic angiogram, mid stream  PA projection. Intravascular ultrasound interrogation, high-grade stenosis,  ostial diagonal #2 and proximal - mid left anterior descending after heparin  3000 units intravenous, subsequent coronary spasm and intracoronary  thrombosis formation with ongoing acute anterior wall myocardial infarction.  Intra-aortic balloon pump placement, temporary transvenous pacer placement,  Aggrastat bolus x1, Angiomax bolus plus infusion, weight-adjusted heparin,  Plavix 300 mg p.o. x1, intravenous amiodarone therapy, attempted low  pressure balloon angioplasty thrombotic occlusion, proximal left anterior  descending, main left and circumflex.   BRIEF HISTORY:  The patient is a 65 year old, married white female who is a  non smoker.  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  Over the last two  weeks, she developed recurrent episodes of chest pain, compatible ischemia  beginning two weeks prior to admission.  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  She also had nausea and emesis and had pallor at her first  episode two weeks prior to admission. Her chest pain lasted almost an hour,  eased off, she went home and went to bed.  She  had an episode of diarrhea  following that and was treated symptomatically with Imodium as an  outpatient.   There are moderate GI reflux symptoms. She was admitted with recurrent  prolonged chest pain that began the night prior to admission. She was  admitted in the early a.m. of June 17, 2002.  Myocardial infarction was  ruled out by serial enzymes and ECGs. She was initially on Lovenox then  switched to IV heparin and continued on aspirin therapy.  Essentially she  had three episodes of substernal chest pain, all lasting greater than 20-30  minutes over the last two weeks, and her last episode was at work prompting  her admission in the early morning. She has family history of coronary  disease, a father with CABG in his late 6s. This is clinically felt to be  unstable angina and treated as such. Informed consent was obtained to  proceed with catheterization and intervention if necessary.   DESCRIPTION OF PROCEDURE:  The patient was brought to the second floor CP  Lab in the postabsorptive state, heparin was on hold.  She was given  5 mg of  Valium as premedication.  The right groin was prepped and draped in the  usual manner.  Then 1% Xylocaine was used for local anesthesia.  She was  given 2 mg of Versed for sedation at the beginning of the procedure.  A  single anterior puncture was performed into the CFRA and 6 [place] short side-  arm sheath was inserted.  Catheterization was performed with a 6 French 4 cm  tapered preformed coronary and pigtail Cordis catheters using Omnipaque dye  throughout the procedure. LV angiogram was done in the RAO and LAO  projections, at 25 cc 14 cc/sec. at 20 cc 12 cc/sec. with pullback pressure  of the CA showing no gradient across the aortic valve.   Abdominal aortic angiogram was done in the mid stream, PA projection at 25  cc 20 cc/sec.  Subselective LIMA was done with the right coronary catheter.   The patient tolerated the diagnostic  procedure well.   Arterial pressures ranged from 150 to 160 mmHg systolic, diastolic of 70 to  80 mmHg. There was no gradient across the aortic valve on catheter pullback.   LV angiogram demonstrated a normal contracting ventricle with EF  approximately 55% and no mitral regurgitation.   Abdominal aortic angiogram in the mid stream PA projection revealed single  patent renal arteries bilaterally, normal infrarenal abdominal aorta.   The main left coronary was very short but normal.   The circumflex artery is comprised of two marginal branches of moderate  size, nondominant and normal.   The left anterior descending artery had a 70% stenosis that began just after  the origin of the second twin diagonal branch at the junction of the  proximal and mid third. The LAD had 70% eccentric stenosis angiographically  and the twin diagonal had 95% concentric stenosis that was best seen in half  axial RAO views with overlap in the LAO views. It was clearly visualized and  nitroglycerin 0.2 mg was given intracoronary without resolution of this or  the LAD lesion.   The patient was felt to be a potential candidate for intervention with  possible PTCA and stenting of her ostial and proximal diagonal branch, which  was fairly focal, and stenting of her LAD.  Possibility of bifurcation  stenting might need to be entertained as well.   It was felt best to proceed with IVUS interrogation to assess the severity  of her LAD as well as the reference size of her LAD and diagonal.   There was difficulty in catheterizing the left main because of a very short  left main and the guiding catheter going just beyond the LAD ostia.  Several  sizes were used including JL 3.5 and 3.0. The guiding catheter that was able  to fit best was a Scimed Q 3.5 guide. We were careful not to allow any  damping of this guiding catheter. Two HTF 0.014 inch guide wires were then used.  The guiding catheter was actually a 7  [place] passed through an  upgraded 7 [place] sheath using standard guide wire exchange. The patient was  given 3000 intravenous units of heparin.   We was able to manipulate one guide wire into the diagonal across the lesion  and one guide wire into the LAD across the lesion; both were stable and  free.   The Atlantis Scimed IVUS catheter was then used for IVUS interrogation of  the LAD.   The distal reference LAD beyond the lesion was  3.3 x 3.4 mm.   The lesion itself was 1.9 x 2.0 mm which was tighter than we had anticipated  angiographically and was approximately 80%.   The IVUS was then pulled back.  IVUS interrogation was then performed over  the diagonal wire. The diagonal #2 diameter was greater than 3 mm in the  reference vessel and there was high-grade 90-95% stenosis of the ostium and  diagonal.   The IVUS was removed. The patient then began having chest discomfort and ST  segment changes compatible with anterior ischemia.   These became progressive and she was given intermittent doses of Nubain for  pain and Versed for sedation.   Injection of the left coronary revealed thrombosis formation with filling  defect of the LAD and the diagonal bifurcation and just proximal to the  diagonal. Immediately, the patient was given 2000 units more of heparin.  Aggrastat bolus plus infusion had been previously ordered. She was promptly  given Aggrastat bolus.  In the interim Angiomax was ordered as the direct  thrombin inhibitor.  She had been given 300 mg of Plavix p.o. prior to the  IVUS interrogation.   We then used a 3.0 CrossSail balloon to dilate the LAD and the diagonal at  low pressures at 3 to 4 atmospheres for 10-15 seconds.   The patient became quite unstable with ST segment elevation. We were  concerned about propagation of thrombosis. There was no visible dissection.  I asked Dr. Allyson Sabal, who came into the room to assist me in this. He was  trying to obtain access  through the left groin, which was then prepped for  IABP placement.   He was unable to initially locate the LFA.  Blood pressure had dropped to 60-  70 and I felt it urgent to insert an IABP.  For this reason, I had to pull  the guide wires back and the guiding catheter back from the left coronary  artery.  We used the RFA access to put in a 34 cc Datascope, 8 French IABP.  IABP counterpulsation was begun and the patient stabilized briefly.   Unfortunately, she began having ventricular tachycardia and subsequent  ventricular fibrillation.  She required DC cardioversion in the lab on  several occasions.  She did not require external massage.   She was sedated and anesthesia was called stat as well as CVTS.   Dr. Allyson Sabal was then able to gain access into the LFV and LFA percutaneously.  A temporary transvenous pacemaker was inserted as backup and for possible  AngioJet use.  We then reengaged the left coronary catheter with a 6 French JL 3.0 guiding  catheter.  A Patriot balloon was used to cross into the LAD and an HTF wire  was used to initially cross into the circumflex. Low-grade 2.0 mm balloon  diameter inflations were done at 3 to 4 atmospheres for 10-15 seconds.  This  was done in the circumflex, proximal LAD and main left.  This transiently  brought back some flow.  However, there was reocclusion and recurrent VF  requiring IV amiodarone 500 of 150 mg x2 and an amiodarone drip. There was  initial attempt to pass the AngioJet into the main left over a long guide  wire.  However, the patient was too unstable hemodynamically and  electrically so this had to be abandoned.   We tried to restore flow as best as possible at this time and the patient  had obvious thrombosis on scout injection.  She had transient flow to the  LAD and circumflex but this was marginal.   It was elected to proceed to emergency CABG.  She had already been typed and  crossed.  She was intubated on a  ventilatory, IABP was going and she was on  low-dose dopamine at 10 and Neo-Synephrine was started.  Blood pressure was  90 mmHg on the IABP and she was transported to the operating room for  emergency CABG under the care of Dr. Cruz Condon.   Hopefully she will be able to get grafts to her circumflex, LAD and  diagonal. She was on Angiomax drip on transfer to the operating room.   After the procedure I spoke to the patient's husband at length along with  Dr. Jenne Campus.   We explained the procedure, complications of thrombotic occlusion of her  left coronary artery, subsequent ongoing MI and attempts at reperfusion as  outlined.  The patient understood the risks and the serious nature of this  and the high risk of the current situation.    CATHETERIZATION DIAGNOSES:  1. Arteriosclerotic heart disease - unstable angina.  2. High-grade ostial and proximal of large twin diagonal #2 and high-grade     proximal - mid left anterior descending stenosis.  3. History of polio as a child, left lower extremity.  4. Past bilateral rotator cuff surgery.  5. Gastroesophageal reflux disease.  6. Recent gastroenteritis.  7. Hyperlipidemia.  8. Strong family history of coronary disease.                                                                   Richard A. Alanda Amass, M.D.    RAW/MEDQ  D:  06/19/2002  T:  06/22/2002  Job:  [id number]   cc:   Salvatore Decent. Cornelius Moras, M.D.  [address]  [place]  [place] [address]  Fax: [phone]   Runell Gess, M.D.  [phone] [address], [address]  [place]  [place] [address]  Fax: [phone]   Darlin Priestly, M.D.   [facility]

## 2011-02-24 HISTORY — PX: TOTAL HIP ARTHROPLASTY: SHX124

## 2011-08-07 DIAGNOSIS — I495 Sick sinus syndrome: Secondary | ICD-10-CM | POA: Insufficient documentation

## 2011-08-07 DIAGNOSIS — Z950 Presence of cardiac pacemaker: Secondary | ICD-10-CM | POA: Insufficient documentation

## 2011-11-06 DIAGNOSIS — Z124 Encounter for screening for malignant neoplasm of cervix: Secondary | ICD-10-CM | POA: Diagnosis not present

## 2011-11-10 DIAGNOSIS — M1710 Unilateral primary osteoarthritis, unspecified knee: Secondary | ICD-10-CM | POA: Diagnosis not present

## 2011-11-10 DIAGNOSIS — M25569 Pain in unspecified knee: Secondary | ICD-10-CM | POA: Diagnosis not present

## 2011-11-16 DIAGNOSIS — L821 Other seborrheic keratosis: Secondary | ICD-10-CM | POA: Diagnosis not present

## 2011-11-16 DIAGNOSIS — Z85828 Personal history of other malignant neoplasm of skin: Secondary | ICD-10-CM | POA: Insufficient documentation

## 2011-11-16 DIAGNOSIS — L565 Disseminated superficial actinic porokeratosis (DSAP): Secondary | ICD-10-CM | POA: Diagnosis not present

## 2011-11-16 DIAGNOSIS — D849 Immunodeficiency, unspecified: Secondary | ICD-10-CM | POA: Insufficient documentation

## 2011-11-16 DIAGNOSIS — L570 Actinic keratosis: Secondary | ICD-10-CM | POA: Diagnosis not present

## 2011-11-21 DIAGNOSIS — M5136 Other intervertebral disc degeneration, lumbar region: Secondary | ICD-10-CM | POA: Insufficient documentation

## 2011-11-21 DIAGNOSIS — M51369 Other intervertebral disc degeneration, lumbar region without mention of lumbar back pain or lower extremity pain: Secondary | ICD-10-CM | POA: Insufficient documentation

## 2011-11-29 DIAGNOSIS — Z950 Presence of cardiac pacemaker: Secondary | ICD-10-CM | POA: Diagnosis not present

## 2011-12-14 DIAGNOSIS — Z941 Heart transplant status: Secondary | ICD-10-CM | POA: Insufficient documentation

## 2011-12-14 DIAGNOSIS — I495 Sick sinus syndrome: Secondary | ICD-10-CM | POA: Diagnosis not present

## 2011-12-14 DIAGNOSIS — D899 Disorder involving the immune mechanism, unspecified: Secondary | ICD-10-CM | POA: Diagnosis not present

## 2012-05-21 DIAGNOSIS — Z85828 Personal history of other malignant neoplasm of skin: Secondary | ICD-10-CM | POA: Diagnosis not present

## 2012-05-21 DIAGNOSIS — L821 Other seborrheic keratosis: Secondary | ICD-10-CM | POA: Diagnosis not present

## 2012-05-21 DIAGNOSIS — D492 Neoplasm of unspecified behavior of bone, soft tissue, and skin: Secondary | ICD-10-CM | POA: Diagnosis not present

## 2012-05-21 DIAGNOSIS — L565 Disseminated superficial actinic porokeratosis (DSAP): Secondary | ICD-10-CM | POA: Diagnosis not present

## 2012-06-10 DIAGNOSIS — K121 Other forms of stomatitis: Secondary | ICD-10-CM | POA: Insufficient documentation

## 2012-06-10 DIAGNOSIS — G629 Polyneuropathy, unspecified: Secondary | ICD-10-CM | POA: Insufficient documentation

## 2012-10-22 DIAGNOSIS — I495 Sick sinus syndrome: Secondary | ICD-10-CM | POA: Diagnosis not present

## 2012-11-21 DIAGNOSIS — Z85828 Personal history of other malignant neoplasm of skin: Secondary | ICD-10-CM | POA: Diagnosis not present

## 2012-11-21 DIAGNOSIS — L570 Actinic keratosis: Secondary | ICD-10-CM | POA: Diagnosis not present

## 2012-11-21 DIAGNOSIS — B351 Tinea unguium: Secondary | ICD-10-CM | POA: Diagnosis not present

## 2012-11-21 DIAGNOSIS — L821 Other seborrheic keratosis: Secondary | ICD-10-CM | POA: Diagnosis not present

## 2012-12-03 DIAGNOSIS — K219 Gastro-esophageal reflux disease without esophagitis: Secondary | ICD-10-CM | POA: Diagnosis not present

## 2012-12-03 DIAGNOSIS — M25579 Pain in unspecified ankle and joints of unspecified foot: Secondary | ICD-10-CM | POA: Diagnosis not present

## 2012-12-03 DIAGNOSIS — R05 Cough: Secondary | ICD-10-CM | POA: Diagnosis not present

## 2012-12-03 DIAGNOSIS — H908 Mixed conductive and sensorineural hearing loss, unspecified: Secondary | ICD-10-CM | POA: Diagnosis not present

## 2012-12-03 DIAGNOSIS — H903 Sensorineural hearing loss, bilateral: Secondary | ICD-10-CM | POA: Diagnosis not present

## 2012-12-03 DIAGNOSIS — H6120 Impacted cerumen, unspecified ear: Secondary | ICD-10-CM | POA: Diagnosis not present

## 2012-12-03 DIAGNOSIS — J310 Chronic rhinitis: Secondary | ICD-10-CM | POA: Diagnosis not present

## 2012-12-03 DIAGNOSIS — M1710 Unilateral primary osteoarthritis, unspecified knee: Secondary | ICD-10-CM | POA: Diagnosis not present

## 2012-12-03 DIAGNOSIS — R059 Cough, unspecified: Secondary | ICD-10-CM | POA: Diagnosis not present

## 2012-12-17 DIAGNOSIS — Z48298 Encounter for aftercare following other organ transplant: Secondary | ICD-10-CM | POA: Diagnosis not present

## 2012-12-17 DIAGNOSIS — Z79899 Other long term (current) drug therapy: Secondary | ICD-10-CM | POA: Diagnosis not present

## 2012-12-17 DIAGNOSIS — Z941 Heart transplant status: Secondary | ICD-10-CM | POA: Diagnosis not present

## 2012-12-30 DIAGNOSIS — G609 Hereditary and idiopathic neuropathy, unspecified: Secondary | ICD-10-CM | POA: Diagnosis not present

## 2013-01-08 ENCOUNTER — Encounter (HOSPITAL_BASED_OUTPATIENT_CLINIC_OR_DEPARTMENT_OTHER): Payer: Self-pay | Admitting: *Deleted

## 2013-01-08 NOTE — Progress Notes (Signed)
Dr Greig Right office faxed notes from cardiologist [facility]-pt heart transplant pt from 2004-Dr Fitgerald looked at all notes and clearances-did not need pacer form filled out- [facility] for knee surgery here-pt has had many surgeries with dr Eulah Pont- Will stay overnight if nec-injecting other knee. To bring all meds and overnight bag

## 2013-01-09 DIAGNOSIS — D492 Neoplasm of unspecified behavior of bone, soft tissue, and skin: Secondary | ICD-10-CM | POA: Diagnosis not present

## 2013-01-15 NOTE — H&P (Signed)
  [facility] [address]   [address] [place], [place] [address] [phone] A Division of [facility]  Loreta Ave, M.D.   Robert A. Thurston Hole, M.D.   Burnell Blanks, M.D.   Eulas Post, M.D.   Lunette Stands, M.D Buford Dresser, M.D.  Charlsie Quest, M.D.   Estell Harpin, M.D.   Melina Fiddler, M.D. Genene Churn. Barry Dienes, PA-C            Kirstin A. Shepperson, PA-C Josh Misenheimer, PA-C [place], [place]   RE: Cassie Harvey, Cassie Harvey                                [phone]      DOB: 1946-02-16 PROGRESS NOTE: 12-03-12 [age] year-old white female who returns to the office today with complaints of bilateral knee pain and left ankle pain.  She was last seen in the office on June 25, 2012 and was supposed to be scheduled for right knee arthroscopy with debridement for possible meniscus tear.  She said that there were some issues with her pacemaker and needed to have this corrected.  She has recently been given clearance from her cardiologist to proceed with surgery and comes in today to discuss scheduling.  Continues to have right greater than left knee pain.  More mechanical symptoms on the right.  Left ankle has been sore off and on for the past year.  Increased with walking.  No swelling.  No feeling of instability.    EXAMINATION: Pleasant white female, alert and oriented x 3 and in no acute distress.  Gait is antalgic.  Bilateral knees good range of motion.  She has right greater than left joint line tenderness.  Ligaments stable.  Minimal swelling without significant palpable effusions.  Bilateral calves non-tender.  Neurovascularly intact.  Skin warm and dry.  Left ankle good range of motion.  She is tender over the anterior tibiotalar joint line.  No swelling or bruising.     X-RAYS: Bilateral knees, AP, lateral and sunrise views, show some tricompartmental narrowing, otherwise look pretty good for patient's age.  No acute  changes.  Left ankle, AP and lateral views, show some joint space narrowing. No acute changes.  Osteopenia.     IMPRESSION: 1. Right greater than left knee pain, possibly secondary to meniscus tear.  Unable to get an MRI scan due to her pacemaker. 2. 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   3. Increased left knee and left ankle pain also possibly related to over compensating for the right knee.    PLAN: Today pre-op paperwork filled out for right knee scope with debridement and while under anesthesia we will do left knee and left ankle intraarticular Depo-Medrol/Marcaine injections.  Surgical procedure, along with potential rehab/recovery time discussed.  We will need pre-op clearances.  Patient also stated that her cardiologist and transplant coordinator recommended that she have general anesthesia because of her history of long-term Methadone use.  We will get a pre-op anesthesia consult and let the anesthesiologist decide which is best.     Loreta Ave, M.D.   Electronically verified by Loreta Ave, M.D. DFM(JMO):jjh D 12-03-12 T 12-04-12

## 2013-01-16 ENCOUNTER — Ambulatory Visit (HOSPITAL_BASED_OUTPATIENT_CLINIC_OR_DEPARTMENT_OTHER): Payer: 59 | Admitting: Anesthesiology

## 2013-01-16 ENCOUNTER — Ambulatory Visit (HOSPITAL_BASED_OUTPATIENT_CLINIC_OR_DEPARTMENT_OTHER)
Admission: RE | Admit: 2013-01-16 | Discharge: 2013-01-16 | Disposition: A | Discharge status: Home / Self Care | Payer: 59 | Source: Ambulatory Visit | Attending: Orthopedic Surgery | Admitting: Orthopedic Surgery

## 2013-01-16 ENCOUNTER — Encounter (HOSPITAL_BASED_OUTPATIENT_CLINIC_OR_DEPARTMENT_OTHER): Payer: Self-pay | Admitting: Anesthesiology

## 2013-01-16 ENCOUNTER — Encounter (HOSPITAL_BASED_OUTPATIENT_CLINIC_OR_DEPARTMENT_OTHER)
Admission: RE | Disposition: A | Discharge status: Home / Self Care | Payer: Self-pay | Source: Ambulatory Visit | Attending: Orthopedic Surgery

## 2013-01-16 ENCOUNTER — Encounter (HOSPITAL_BASED_OUTPATIENT_CLINIC_OR_DEPARTMENT_OTHER): Payer: Self-pay

## 2013-01-16 DIAGNOSIS — Z9889 Other specified postprocedural states: Secondary | ICD-10-CM

## 2013-01-16 DIAGNOSIS — M23302 Other meniscus derangements, unspecified lateral meniscus, unspecified knee: Secondary | ICD-10-CM | POA: Insufficient documentation

## 2013-01-16 DIAGNOSIS — M24176 Other articular cartilage disorders, unspecified foot: Secondary | ICD-10-CM | POA: Insufficient documentation

## 2013-01-16 DIAGNOSIS — M1710 Unilateral primary osteoarthritis, unspecified knee: Secondary | ICD-10-CM | POA: Insufficient documentation

## 2013-01-16 DIAGNOSIS — M2340 Loose body in knee, unspecified knee: Secondary | ICD-10-CM | POA: Diagnosis not present

## 2013-01-16 DIAGNOSIS — IMO0002 Reserved for concepts with insufficient information to code with codable children: Secondary | ICD-10-CM | POA: Insufficient documentation

## 2013-01-16 DIAGNOSIS — M249 Joint derangement, unspecified: Secondary | ICD-10-CM | POA: Insufficient documentation

## 2013-01-16 DIAGNOSIS — M24173 Other articular cartilage disorders, unspecified ankle: Secondary | ICD-10-CM | POA: Insufficient documentation

## 2013-01-16 HISTORY — DX: Frequency of micturition: R35.0

## 2013-01-16 HISTORY — DX: Essential (primary) hypertension: I10

## 2013-01-16 HISTORY — DX: Presence of cardiac pacemaker: Z95.0

## 2013-01-16 HISTORY — DX: Other intervertebral disc degeneration, lumbar region without mention of lumbar back pain or lower extremity pain: M51.369

## 2013-01-16 HISTORY — DX: Heart transplant status: Z94.1

## 2013-01-16 HISTORY — DX: Other chronic pain: G89.29

## 2013-01-16 HISTORY — DX: Disorder involving the immune mechanism, unspecified: D89.9

## 2013-01-16 HISTORY — DX: Immunodeficiency, unspecified: D84.9

## 2013-01-16 HISTORY — DX: Unspecified osteoarthritis, unspecified site: M19.90

## 2013-01-16 HISTORY — DX: Other intervertebral disc degeneration, lumbar region: M51.36

## 2013-01-16 HISTORY — PX: KNEE ARTHROSCOPY WITH MEDIAL MENISECTOMY: SHX5651

## 2013-01-16 HISTORY — DX: Other forms of stomatitis: K12.1

## 2013-01-16 HISTORY — DX: Polyneuropathy, unspecified: G62.9

## 2013-01-16 HISTORY — PX: STERIOD INJECTION: SHX5046

## 2013-01-16 HISTORY — PX: INJECTION KNEE: SHX2446

## 2013-01-16 LAB — POCT HEMOGLOBIN-HEMACUE: Hemoglobin: 11.2 g/dL — ABNORMAL LOW (ref 12.0–15.0)

## 2013-01-16 SURGERY — ARTHROSCOPY, KNEE, WITH MEDIAL MENISCECTOMY
Anesthesia: General | Site: Knee | Laterality: Right | Wound class: Clean

## 2013-01-16 MED ORDER — FENTANYL CITRATE 0.05 MG/ML IJ SOLN
INTRAMUSCULAR | Status: DC | PRN
  Administered 2013-01-16: 100 ug via INTRAVENOUS
  Administered 2013-01-16: 25 ug via INTRAVENOUS

## 2013-01-16 MED ORDER — MIDAZOLAM HCL 2 MG/2ML IJ SOLN: 1.0000 mg | INTRAMUSCULAR | Status: DC | PRN

## 2013-01-16 MED ORDER — BUPIVACAINE HCL (PF) 0.5 % IJ SOLN
INTRAMUSCULAR | Status: DC | PRN
  Administered 2013-01-16: 5 mL

## 2013-01-16 MED ORDER — METHYLPREDNISOLONE ACETATE 80 MG/ML IJ SUSP
INTRAMUSCULAR | Status: DC | PRN
  Administered 2013-01-16: 40 mg

## 2013-01-16 MED ORDER — HYDROMORPHONE HCL PF 1 MG/ML IJ SOLN
0.2500 mg | INTRAMUSCULAR | Status: DC | PRN
  Administered 2013-01-16 (×2): 0.5 mg via INTRAVENOUS

## 2013-01-16 MED ORDER — DEXAMETHASONE SODIUM PHOSPHATE 4 MG/ML IJ SOLN
INTRAMUSCULAR | Status: DC | PRN
  Administered 2013-01-16: 10 mg via INTRAVENOUS

## 2013-01-16 MED ORDER — METOCLOPRAMIDE HCL 5 MG/ML IJ SOLN: 10.0000 mg | Freq: Once | INTRAMUSCULAR | Status: DC | PRN

## 2013-01-16 MED ORDER — LACTATED RINGERS IV SOLN
INTRAVENOUS | Status: DC
  Administered 2013-01-16: 10:00:00 via INTRAVENOUS

## 2013-01-16 MED ORDER — LIDOCAINE HCL (CARDIAC) 20 MG/ML IV SOLN
INTRAVENOUS | Status: DC | PRN
  Administered 2013-01-16: 100 mg via INTRAVENOUS

## 2013-01-16 MED ORDER — MIDAZOLAM HCL 5 MG/5ML IJ SOLN
INTRAMUSCULAR | Status: DC | PRN
  Administered 2013-01-16: 2 mg via INTRAVENOUS

## 2013-01-16 MED ORDER — CEFAZOLIN SODIUM-DEXTROSE 2-3 GM-% IV SOLR
2.0000 g | INTRAVENOUS | Status: AC
  Administered 2013-01-16: 2 g via INTRAVENOUS

## 2013-01-16 MED ORDER — PROPOFOL 10 MG/ML IV BOLUS
INTRAVENOUS | Status: DC | PRN
  Administered 2013-01-16: 150 mg via INTRAVENOUS
  Administered 2013-01-16: 100 mg via INTRAVENOUS

## 2013-01-16 MED ORDER — BUPIVACAINE-EPINEPHRINE 0.25% -1:200000 IJ SOLN
INTRAMUSCULAR | Status: DC | PRN
  Administered 2013-01-16: 20 mL

## 2013-01-16 MED ORDER — ONDANSETRON HCL 4 MG/2ML IJ SOLN
INTRAMUSCULAR | Status: DC | PRN
  Administered 2013-01-16: 4 mg via INTRAVENOUS

## 2013-01-16 MED ORDER — OXYCODONE HCL 5 MG/5ML PO SOLN: 5.0000 mg | Freq: Once | ORAL | Status: AC | PRN

## 2013-01-16 MED ORDER — OXYCODONE HCL 5 MG PO TABS
5.0000 mg | ORAL_TABLET | Freq: Once | ORAL | Status: AC | PRN
  Administered 2013-01-16: 5 mg via ORAL

## 2013-01-16 MED ORDER — FENTANYL CITRATE 0.05 MG/ML IJ SOLN: 50.0000 ug | INTRAMUSCULAR | Status: DC | PRN

## 2013-01-16 MED ORDER — METHYLPREDNISOLONE ACETATE 40 MG/ML IJ SUSP
INTRAMUSCULAR | Status: DC | PRN
  Administered 2013-01-16: 40 mg

## 2013-01-16 MED ORDER — METHYLPREDNISOLONE ACETATE 40 MG/ML IJ SUSP
INTRAMUSCULAR | Status: DC | PRN
  Administered 2013-01-16: 12:00:00 via INTRA_ARTICULAR

## 2013-01-16 MED ORDER — SODIUM CHLORIDE 0.9 % IR SOLN
Status: DC | PRN
  Administered 2013-01-16: 6000 mL

## 2013-01-16 SURGICAL SUPPLY — 45 items
BANDAGE ELASTIC 6 VELCRO ST LF (GAUZE/BANDAGES/DRESSINGS) ×4 IMPLANT
BLADE CUDA 5.5 (BLADE) IMPLANT
BLADE CUDA GRT WHITE 3.5 (BLADE) IMPLANT
BLADE CUTTER GATOR 3.5 (BLADE) ×4 IMPLANT
BLADE CUTTER MENIS 5.5 (BLADE) IMPLANT
BLADE GREAT WHITE 4.2 (BLADE) ×4 IMPLANT
BUR OVAL 4.0 (BURR) IMPLANT
CANISTER OMNI JUG 16 LITER (MISCELLANEOUS) ×4 IMPLANT
CANISTER SUCTION 2500CC (MISCELLANEOUS) IMPLANT
CLOTH BEACON ORANGE TIMEOUT ST (SAFETY) ×4 IMPLANT
CUTTER MENISCUS  4.2MM (BLADE)
CUTTER MENISCUS 4.2MM (BLADE) IMPLANT
DRAPE ARTHROSCOPY W/POUCH 90 (DRAPES) ×4 IMPLANT
DURAPREP 26ML APPLICATOR (WOUND CARE) ×4 IMPLANT
ELECT MENISCUS 165MM 90D (ELECTRODE) IMPLANT
ELECT REM PT RETURN 9FT ADLT (ELECTROSURGICAL)
ELECTRODE REM PT RTRN 9FT ADLT (ELECTROSURGICAL) IMPLANT
GAUZE XEROFORM 1X8 LF (GAUZE/BANDAGES/DRESSINGS) ×4 IMPLANT
GLOVE BIO SURGEON STRL SZ7 (GLOVE) ×4 IMPLANT
GLOVE BIOGEL PI IND STRL 7.0 (GLOVE) ×6 IMPLANT
GLOVE BIOGEL PI IND STRL 8 (GLOVE) ×3 IMPLANT
GLOVE BIOGEL PI INDICATOR 7.0 (GLOVE) ×2
GLOVE BIOGEL PI INDICATOR 8 (GLOVE) ×1
GLOVE ECLIPSE 6.5 STRL STRAW (GLOVE) ×4 IMPLANT
GLOVE EXAM NITRILE MD LF STRL (GLOVE) ×4 IMPLANT
GLOVE ORTHO TXT STRL SZ7.5 (GLOVE) ×8 IMPLANT
GOWN PREVENTION PLUS XLARGE (GOWN DISPOSABLE) ×8 IMPLANT
GOWN STRL REIN 2XL XLG LVL4 (GOWN DISPOSABLE) ×8 IMPLANT
HOLDER KNEE FOAM BLUE (MISCELLANEOUS) ×4 IMPLANT
IV NS IRRIG 3000ML ARTHROMATIC (IV SOLUTION) ×16 IMPLANT
KNEE WRAP E Z 3 GEL PACK (MISCELLANEOUS) IMPLANT
NDL SAFETY ECLIPSE 18X1.5 (NEEDLE) ×3 IMPLANT
NEEDLE HYPO 18GX1.5 SHARP (NEEDLE) ×1
NEEDLE HYPO 25X1 1.5 SAFETY (NEEDLE) ×4 IMPLANT
PACK ARTHROSCOPY DSU (CUSTOM PROCEDURE TRAY) ×4 IMPLANT
PACK BASIN DAY SURGERY FS (CUSTOM PROCEDURE TRAY) ×4 IMPLANT
PENCIL BUTTON HOLSTER BLD 10FT (ELECTRODE) IMPLANT
SET ARTHROSCOPY TUBING (MISCELLANEOUS) ×1
SET ARTHROSCOPY TUBING LN (MISCELLANEOUS) ×3 IMPLANT
SPONGE GAUZE 4X4 12PLY (GAUZE/BANDAGES/DRESSINGS) ×8 IMPLANT
SUT ETHILON 3 0 PS 1 (SUTURE) ×4 IMPLANT
SUT VIC AB 3-0 FS2 27 (SUTURE) IMPLANT
SYR CONTROL 10ML LL (SYRINGE) ×8 IMPLANT
TOWEL OR 17X24 6PK STRL BLUE (TOWEL DISPOSABLE) ×4 IMPLANT
WATER STERILE IRR 1000ML POUR (IV SOLUTION) ×4 IMPLANT

## 2013-01-16 NOTE — Anesthesia Postprocedure Evaluation (Signed)
Anesthesia Post Note  Patient: Cassie Harvey  Procedure(s) Performed: Procedure(s) (LRB): RIGHT KNEE ARTHROSCOPY WITH MEDIAL AND LATERAL MENISECTOMY, REMOVAL LOOSE FOREIGN BODY AND STEROID INJECTION (Right) KNEE INJECTION (Left) STEROID INJECTION (Left)  Anesthesia type: General  Patient location: PACU  Post pain: Pain level controlled  Post assessment: Patient's Cardiovascular Status Stable  Last Vitals:  Filed Vitals:   01/16/13 1315  BP: 153/118  Pulse: 63  Temp:   Resp: 15    Post vital signs: Reviewed and stable  Level of consciousness: alert  Complications: No apparent anesthesia complications

## 2013-01-16 NOTE — Brief Op Note (Signed)
01/16/2013  12:28 PM  PATIENT:  Cassie Harvey  67 y.o. female  PRE-OPERATIVE DIAGNOSIS:  RIGHT KNEE MEDIAL MENISCUS TEAR, LOOSE BODY OSTEOCHONDRAL FRAGMENT, LEFT KNEE DEGENERATIVE ARTHRITIS  POST-OPERATIVE DIAGNOSIS:  * No post-op diagnosis entered *  PROCEDURE:  Right knee scope with debridement, chondroplasty and left knee/ankle intra-articular marcaine/depo injection    SURGEON:  Surgeon(s) and Role:    * Loreta Ave, MD - Primary  PHYSICIAN ASSISTANT: Zonia Kief M   ANESTHESIA:   general  EBL:  Total I/O In: 700 [I.V.:700] Out: -   [id number]}  SPECIMEN:  No Specimen  DISPOSITION OF SPECIMEN:  N/A  COUNTS:  YES  TOURNIQUET:  * No tourniquets in log *   PATIENT DISPOSITION:  PACU - hemodynamically stable.

## 2013-01-16 NOTE — Anesthesia Procedure Notes (Signed)
Procedure Name: LMA Insertion Date/Time: 01/16/2013 11:19 AM Performed by: Meyer Russel Pre-anesthesia Checklist: Patient identified, Emergency Drugs available, Suction available and Patient being monitored Patient Re-evaluated:Patient Re-evaluated prior to inductionOxygen Delivery Method: Circle System Utilized Preoxygenation: Pre-oxygenation with 100% oxygen Intubation Type: IV induction Ventilation: Mask ventilation without difficulty LMA: LMA inserted LMA Size: 4.0 Number of attempts: 1 Airway Equipment and Method: bite block Placement Confirmation: positive ETCO2 and breath sounds checked- equal and bilateral Tube secured with: Tape Dental Injury: Teeth and Oropharynx as per pre-operative assessment

## 2013-01-16 NOTE — Interval H&P Note (Signed)
History and Physical Interval Note:  01/16/2013 7:30 AM  Cassie Harvey  has presented today for surgery, with the diagnosis of RIGHT KNEE MEDIAL MENISCUS TEAR, LOOSE BODY OSTEOCHONDRAL FRAGMENT, LEFT KNEE DEGENERATIVE ARTHRITIS  The various methods of treatment have been discussed with the patient and family. After consideration of risks, benefits and other options for treatment, the patient has consented to  Procedure(s): RIGHT KNEE ARTHROSCOPY WITH MEDIAL AND LATERAL MENISECTOMY, REMOVAL LOOSE FOREIGN BODY (Right) LEFT KNEE INJECTION ASPIRATION ARTHROCENTESIS MAJOR JOINT BURSA (Left) LEFT ANKLE INJECTION ASPIRATION ARTHROCENTESIS (Left) as a surgical intervention .  The patient's history has been reviewed, patient examined, no change in status, stable for surgery.  I have reviewed the patient's chart and labs.  Questions were answered to the patient's satisfaction.     [name]

## 2013-01-16 NOTE — Anesthesia Preprocedure Evaluation (Signed)
Anesthesia Evaluation  Patient identified by MRN, date of birth, ID band Patient awake    Reviewed: Allergy & Precautions, H&P , NPO status , Patient's Chart, lab work & pertinent test results, reviewed documented beta blocker date and time   Airway Mallampati: II TM Distance: >3 FB Neck ROM: full    Dental   Pulmonary neg pulmonary ROS,  breath sounds clear to auscultation        Cardiovascular hypertension, + pacemaker Rhythm:regular     Neuro/Psych  Neuromuscular disease negative psych ROS   GI/Hepatic negative GI ROS, Neg liver ROS,   Endo/Other  negative endocrine ROS  Renal/GU negative Renal ROS  negative genitourinary   Musculoskeletal   Abdominal   Peds  Hematology negative hematology ROS (+)   Anesthesia Other Findings See surgeon's H&P  [facility] cardiology notes reviewed  Reproductive/Obstetrics negative OB ROS                           Anesthesia Physical Anesthesia Plan  ASA: III  Anesthesia Plan: General   Post-op Pain Management:    Induction: Intravenous  Airway Management Planned: LMA  Additional Equipment:   Intra-op Plan:   Post-operative Plan:   Informed Consent: I have reviewed the patients History and Physical, chart, labs and discussed the procedure including the risks, benefits and alternatives for the proposed anesthesia with the patient or authorized representative who has indicated his/her understanding and acceptance.   Dental Advisory Given  Plan Discussed with: CRNA and Surgeon  Anesthesia Plan Comments:         Anesthesia Quick Evaluation

## 2013-01-16 NOTE — Transfer of Care (Signed)
Immediate Anesthesia Transfer of Care Note  Patient: Cassie Harvey  Procedure(s) Performed: Procedure(s) with comments: RIGHT KNEE ARTHROSCOPY WITH MEDIAL AND LATERAL MENISECTOMY, REMOVAL LOOSE FOREIGN BODY AND STEROID INJECTION (Right) - DEPRIEDMENT LATERAL MENISCUS AND CHONDROPLASTY KNEE INJECTION (Left) - STEROID  STEROID INJECTION (Left)  Patient Location: PACU  Anesthesia Type:General  Level of Consciousness: awake, alert  and oriented  Airway & Oxygen Therapy: Patient Spontanous Breathing and Patient connected to face mask oxygen  Post-op Assessment: Report given to PACU RN, Post -op Vital signs reviewed and stable and Patient moving all extremities  Post vital signs: Reviewed and stable  Complications: No apparent anesthesia complications

## 2013-01-17 ENCOUNTER — Encounter (HOSPITAL_BASED_OUTPATIENT_CLINIC_OR_DEPARTMENT_OTHER): Payer: Self-pay | Admitting: Orthopedic Surgery

## 2013-01-17 NOTE — Op Note (Signed)
NAMEGERILYNN, MCCULLARS NO.:  [id number]  MEDICAL RECORD NO.:  [id number]  LOCATION:                                 FACILITY:  PHYSICIAN:  Loreta Ave, M.D. DATE OF BIRTH:  December 29, 1945  DATE OF PROCEDURE:  01/16/2013 DATE OF DISCHARGE:                              OPERATIVE REPORT   PREOPERATIVE DIAGNOSES: 1. Right knee moderate degenerative arthritis.  Question degenerative     medial and lateral meniscus tears with chondromalacia loose bodies. 2. Left knee moderate tricompartmental degenerative arthritis. 3. Left ankle posttraumatic synovitis, mild degenerative changes.  POSTOPERATIVE DIAGNOSES: 1. Right knee tricompartmental degenerative arthritis.  Grade 3 and 4     patellofemoral joint.  Grade 3 medial compartment focal grade 4     laterally.  Frayed partial tearing, lateral meniscus.  Chondral     loose bodies. 2. Left knee moderate tricompartmental degenerative arthritis. 3. Left ankle posttraumatic synovitis, mild degenerative changes.  PROCEDURE: 1. Right knee exam under anesthesia, arthroscopy.  Tricompartmental     chondroplasty.  Removal of chondral loose bodies.  Debridement of     lateral meniscus. 2. Left knee intra-articular injection with Depo-Medrol and Marcaine. 3. Left ankle injection with Depo-Medrol and Marcaine.  SURGEON:  Loreta Ave, M.D.  ASSISTANT:  Genene Churn. Barry Dienes, [place].  ANESTHESIA:  General.  BLOOD LOSS:  Minimal.  SPECIMENS:  None.  CULTURES:  None.  COMPLICATIONS:  None.  DRESSINGS:  Soft compressive.  TOURNIQUET:  Not employed.  PROCEDURE:  The patient was brought to the operating room, placed on operating table in supine position.  After adequate anesthesia had been obtained, left knee injected intra-articularly under sterile technique with Depo-Medrol and Marcaine.  Band-aid applied.  From an anterior approach, the left ankle injected intra-articularly with Depo-Medrol and Marcaine.  Attention turned  to the right knee.  Leg holder applied.  Leg prepped and draped in usual sterile fashion.  Two portals, one each medial and lateral parapatellar.  Arthroscope introduced.  Knee distended and inspected.  Numerous chondral loose bodies, some reactive synovitis throughout the knee debrided.  No large loose bodies seen. Patellofemoral joint good tracking, but unfortunately grade 4 changes over the entire medial trochlea with grade 3 changes on the patella. Chondroplasty to a stable surface.  Cruciate ligaments intact.  Medial compartment grade 2 and 3 changes, weightbearing dome.  Meniscus itself was intact without tears.  Laterally, a focal grade 4 lesion centrally on the condyle on the lateral side.  Chondroplasty to a stable surface. Given the diffuse changes, chondroplasty and micro fracturing not indicated.  I did remove all unstable fragments.  Radial tearing circumferentially around the lateral meniscus debrided back to a stable surface.  At completion, the entire knee examined.  No other findings were appreciated.  Instruments were removed.  Portals were closed with nylon.  Intra-articular injection of Depo-Medrol and Marcaine. Anesthesia reversed.  Brought to the recovery room.  Tolerated the surgery well.  No complications.     Loreta Ave, M.D.     DFM/MEDQ  D:  01/16/2013  T:  01/17/2013  Job:  [id number]

## 2013-02-19 DIAGNOSIS — B351 Tinea unguium: Secondary | ICD-10-CM | POA: Insufficient documentation

## 2013-07-29 ENCOUNTER — Other Ambulatory Visit (HOSPITAL_COMMUNITY): Payer: 59

## 2013-08-06 ENCOUNTER — Inpatient Hospital Stay (HOSPITAL_COMMUNITY): Admission: RE | Admit: 2013-08-06 | Payer: 59 | Source: Ambulatory Visit | Admitting: Orthopedic Surgery

## 2013-08-06 ENCOUNTER — Encounter (HOSPITAL_COMMUNITY): Admission: RE | Payer: Self-pay | Source: Ambulatory Visit

## 2013-08-06 DIAGNOSIS — R197 Diarrhea, unspecified: Secondary | ICD-10-CM | POA: Diagnosis not present

## 2013-08-06 DIAGNOSIS — R109 Unspecified abdominal pain: Secondary | ICD-10-CM | POA: Diagnosis not present

## 2013-08-06 SURGERY — ARTHROPLASTY, KNEE, TOTAL
Anesthesia: General | Laterality: Right

## 2013-10-01 DIAGNOSIS — M6281 Muscle weakness (generalized): Secondary | ICD-10-CM | POA: Diagnosis not present

## 2013-10-01 DIAGNOSIS — S42213B Unspecified displaced fracture of surgical neck of unspecified humerus, initial encounter for open fracture: Secondary | ICD-10-CM | POA: Diagnosis not present

## 2013-10-01 DIAGNOSIS — M25519 Pain in unspecified shoulder: Secondary | ICD-10-CM | POA: Diagnosis not present

## 2013-10-01 DIAGNOSIS — M25619 Stiffness of unspecified shoulder, not elsewhere classified: Secondary | ICD-10-CM | POA: Diagnosis not present

## 2013-10-09 DIAGNOSIS — M6281 Muscle weakness (generalized): Secondary | ICD-10-CM | POA: Diagnosis not present

## 2013-10-09 DIAGNOSIS — M25619 Stiffness of unspecified shoulder, not elsewhere classified: Secondary | ICD-10-CM | POA: Diagnosis not present

## 2013-10-09 DIAGNOSIS — M25519 Pain in unspecified shoulder: Secondary | ICD-10-CM | POA: Diagnosis not present

## 2013-10-09 DIAGNOSIS — S42213B Unspecified displaced fracture of surgical neck of unspecified humerus, initial encounter for open fracture: Secondary | ICD-10-CM | POA: Diagnosis not present

## 2013-10-13 DIAGNOSIS — M25519 Pain in unspecified shoulder: Secondary | ICD-10-CM | POA: Diagnosis not present

## 2013-10-13 DIAGNOSIS — M6281 Muscle weakness (generalized): Secondary | ICD-10-CM | POA: Diagnosis not present

## 2013-10-13 DIAGNOSIS — M25619 Stiffness of unspecified shoulder, not elsewhere classified: Secondary | ICD-10-CM | POA: Diagnosis not present

## 2013-10-13 DIAGNOSIS — S42213B Unspecified displaced fracture of surgical neck of unspecified humerus, initial encounter for open fracture: Secondary | ICD-10-CM | POA: Diagnosis not present

## 2013-10-14 ENCOUNTER — Encounter: Payer: Self-pay | Admitting: Neurology

## 2013-10-15 ENCOUNTER — Encounter (INDEPENDENT_AMBULATORY_CARE_PROVIDER_SITE_OTHER): Payer: Self-pay

## 2013-10-15 ENCOUNTER — Ambulatory Visit (INDEPENDENT_AMBULATORY_CARE_PROVIDER_SITE_OTHER): Payer: 59 | Admitting: Neurology

## 2013-10-15 ENCOUNTER — Encounter: Payer: Self-pay | Admitting: Neurology

## 2013-10-15 VITALS — BP 146/80 | HR 70 | Ht 65.0 in | Wt 146.0 lb

## 2013-10-15 DIAGNOSIS — M6281 Muscle weakness (generalized): Secondary | ICD-10-CM | POA: Diagnosis not present

## 2013-10-15 DIAGNOSIS — Z8612 Personal history of poliomyelitis: Secondary | ICD-10-CM

## 2013-10-15 HISTORY — DX: Personal history of poliomyelitis: Z86.12

## 2013-10-15 NOTE — Progress Notes (Signed)
Reason for visit: Leg weakness  Cassie Harvey is a 68 y.o. female  History of present illness:  Ms. 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 The patient indicates that the weakness in the leg essentially improved, and she has had very minimal problems with weakness in the leg throughout her life until recently. The patient has a 12 year history of burning pain in the muscles associated with physical activity. The patient indicates that if she does any extensive walking, the burning discomfort will become quite severe. The patient will have muscle cramps in the thighs, right greater than left. The patient also has a history of chronic low back pain, and she has had CT and MRI evaluations of the low back in the past, but surgery was not recommended. The patient believes that over the last one year, she has had some progressive worsening weakness in the legs, and she is now having difficulty climbing stairs and getting out of chairs. The patient has had occasional falls, and she recently fell and fractured her right shoulder. The patient denies any problems controlling the bowels, but she does have some occasional urinary incontinence. The patient denies any numbness in the arms or legs. The patient is not clear that she has any weakness of the arms. 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 The patient has been intolerant to statin drugs in the past, but she currently is not on any medication of this type. The patient does take Niaspan 500 mg at night. The patient is sent to this office for further evaluation.  Past Medical History  Diagnosis Date  . Heart transplanted 2004  . Hypertension   . Neuropathy, peripheral   . DJD (degenerative joint disease)   . DDD (degenerative disc disease), lumbar   . Immunosuppression   .  Pacemaker   . Chronic pain   . Urinary frequency   . Mouth ulcers   . 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 Wrist fracture, bilateral     Surgical repair left    Past Surgical History  Procedure Laterality Date  . Heart transplant  11/2002  . Pacemaker insertion  [id number]    new dual chamber -medtronic 5076-art/vent leads  . Wrist arthroplasty  2008    left  . Total hip arthroplasty  02/2011    rt  . Shoulder arthroscopy  96,02    right abd left  . Elbow arthroplasty      rt  . Knee arthroscopy  1998    rt  . Ear examination under anesthesia  1977    lt middle ear   . Septoplasty  1992  . Appendectomy    . Colonoscopy    . Tubal ligation  1990  . Cardiac catheterization  2003  . Knee arthroscopy with medial menisectomy Right 01/16/2013    Procedure: RIGHT KNEE ARTHROSCOPY WITH MEDIAL AND LATERAL MENISECTOMY, REMOVAL LOOSE FOREIGN BODY AND STEROID INJECTION;  Surgeon: [name], MD;  Location: [place];  Service: Orthopedics;  Laterality: Right;  DEPRIEDMENT LATERAL MENISCUS AND CHONDROPLASTY  . Injection knee Left 01/16/2013    Procedure: KNEE INJECTION;  Surgeon: [name], MD;  Location: [place];  Service: Orthopedics;  Laterality: Left;  STEROID   . Steriod injection Left 01/16/2013  Procedure: STEROID INJECTION;  Surgeon: [name], MD;  Location: [place];  Service: Orthopedics;  Laterality: Left;    Family History  Problem Relation Age of Onset  . Pulmonary fibrosis Father     Social history:  reports that she has never smoked. She has never used smokeless tobacco. She reports that she does not drink alcohol or use illicit drugs.  Medications:  Current Outpatient Prescriptions on File Prior to Visit  Medication Sig Dispense Refill  . acyclovir ointment (ZOVIRAX) 5 % Apply 1 applicator topically daily as needed.      . calcium-vitamin D (OSCAL WITH D) 500-200 MG-UNIT per tablet Take 1 tablet by  mouth 2 (two) times daily.      Marland Kitchen docusate sodium (COLACE) 100 MG capsule Take 100 mg by mouth 2 (two) times daily.      . fish oil-omega-3 fatty acids 1000 MG capsule Take 2 g by mouth daily.      Marland Kitchen lisinopril (PRINIVIL,ZESTRIL) 5 MG tablet Take 5 mg by mouth daily.      Marland Kitchen loratadine (CLARITIN) 10 MG tablet Take 10 mg by mouth daily as needed.       . magnesium oxide (MAG-OX) 400 MG tablet Take 400 mg by mouth 2 (two) times daily.      . methadone (DOLOPHINE) 5 MG tablet Take 5 mg by mouth every 8 (eight) hours. Take 1/2 am and pm and 1 hs      . mycophenolate (CELLCEPT) 500 MG tablet Take by mouth 2 (two) times daily.      . niacin (NIASPAN) 500 MG CR tablet Take 500 mg by mouth at bedtime.      . ondansetron (ZOFRAN) 8 MG tablet Take by mouth every 8 (eight) hours as needed for nausea.      . sodium chloride (OCEAN) 0.65 % nasal spray Place 0.65 sprays into the nose daily.      . solifenacin (VESICARE) 10 MG tablet Take by mouth daily.      . traMADol (ULTRAM) 50 MG tablet Take 50 mg by mouth every 6 (six) hours as needed for pain.      Marland Kitchen tretinoin (RETIN-A) 0.05 % cream Apply 0.05 g topically at bedtime.      . valACYclovir (VALTREX) 500 MG tablet Take 500 mg by mouth daily.       No current facility-administered medications on file prior to visit.      Allergies  Allergen Reactions  . Oxybutynin Shortness Of Breath  . Bacitracin Swelling  . Neomycin Swelling  . Sulfa Antibiotics Rash  . Sulfabenzamide Rash    ROS:  Out of a complete 14 system review of symptoms, the patient complains only of the following symptoms, and all other reviewed systems are negative.  Hearing loss Urinary incontinence Joint pain, muscle cramps, achy muscles Allergies Memory loss, muscle weakness Insomnia Restless legs  Blood pressure 146/80, pulse 70, height 5\' 5"  (1.651 m), weight 146 lb (66.225 kg).  Physical Exam  General: The patient is alert and cooperative at the time of the  examination.  Eyes: Pupils are equal, round, and reactive to light. Discs are flat bilaterally.  Neck: The neck is supple, no carotid bruits are noted.  Respiratory: The respiratory examination is clear.  Cardiovascular: The cardiovascular examination reveals a regular rate and rhythm, no obvious murmurs or rubs are noted.  Skin: Extremities are without significant edema.  Neurologic Exam  Mental status: The patient is alert and oriented x  3 at the time of the examination. The patient has apparent normal recent and remote memory, with an apparently normal attention span and concentration ability.  Cranial nerves: Facial symmetry is not present. Ptosis is noted on the left eye. There is good sensation of the face to pinprick and soft touch bilaterally. The strength of the facial muscles and the muscles to head turning and shoulder shrug are normal bilaterally. Speech is well enunciated, no aphasia or dysarthria is noted. Extraocular movements are full. Visual fields are full. The tongue is midline, and the patient has symmetric elevation of the soft palate. No obvious hearing deficits are noted.  Motor: The motor testing reveals good distal strength of both arms. The right arm could not be tested proximally. The left arm reveals very mild weakness with the deltoid, biceps, and external rotation movements.  Sensory: Sensory testing is intact to pinprick, soft touch, vibration sensation, and position sense on all 4 extremities. No evidence of extinction is noted.  Coordination: Cerebellar testing reveals good finger-nose-finger and heel-to-shin bilaterally.  Gait and station: The patient is unable to arise from a seated position with arms crossed. Once up, the patient has a steppage gait with the left leg. Tandem gait is slightly unsteady. Romberg is negative. No drift is seen.  Reflexes: Deep tendon reflexes are symmetric and normal bilaterally in arms, depressed to absent in the legs.. Toes  are downgoing on the right, equivocal on the left.   Assessment/Plan:  1. History of polio affecting mainly the left leg  2. Chronic myalgias, muscle cramps  3. Progressive weakness of the legs  The patient is noted to have significant weakness throughout the left leg, proximally and distally, with mainly proximal weakness of the right leg. There appears to be some mild weakness involving the left arm proximally, the right arm cannot be tested because of the recent shoulder fracture. The patient will be set up for blood work today, and she will have nerve conduction studies done on both legs, and the left arm. EMG evaluation will be done on the left arm and left leg. 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  Jill Alexanders MD 10/15/2013 8:30 PM  [facility] [address] [place], [place] [address]  Phone [phone] Fax [phone]

## 2013-10-16 ENCOUNTER — Other Ambulatory Visit (INDEPENDENT_AMBULATORY_CARE_PROVIDER_SITE_OTHER): Payer: Self-pay

## 2013-10-16 DIAGNOSIS — Z0289 Encounter for other administrative examinations: Secondary | ICD-10-CM

## 2013-10-16 DIAGNOSIS — M25519 Pain in unspecified shoulder: Secondary | ICD-10-CM | POA: Diagnosis not present

## 2013-10-16 DIAGNOSIS — M25619 Stiffness of unspecified shoulder, not elsewhere classified: Secondary | ICD-10-CM | POA: Diagnosis not present

## 2013-10-16 DIAGNOSIS — S42213B Unspecified displaced fracture of surgical neck of unspecified humerus, initial encounter for open fracture: Secondary | ICD-10-CM | POA: Diagnosis not present

## 2013-10-16 DIAGNOSIS — M6281 Muscle weakness (generalized): Secondary | ICD-10-CM | POA: Diagnosis not present

## 2013-10-20 DIAGNOSIS — S42213B Unspecified displaced fracture of surgical neck of unspecified humerus, initial encounter for open fracture: Secondary | ICD-10-CM | POA: Diagnosis not present

## 2013-10-20 DIAGNOSIS — M25519 Pain in unspecified shoulder: Secondary | ICD-10-CM | POA: Diagnosis not present

## 2013-10-20 DIAGNOSIS — M6281 Muscle weakness (generalized): Secondary | ICD-10-CM | POA: Diagnosis not present

## 2013-10-20 DIAGNOSIS — M25619 Stiffness of unspecified shoulder, not elsewhere classified: Secondary | ICD-10-CM | POA: Diagnosis not present

## 2013-10-21 DIAGNOSIS — M25519 Pain in unspecified shoulder: Secondary | ICD-10-CM | POA: Diagnosis not present

## 2013-10-22 ENCOUNTER — Ambulatory Visit (INDEPENDENT_AMBULATORY_CARE_PROVIDER_SITE_OTHER): Payer: 59 | Admitting: Neurology

## 2013-10-22 ENCOUNTER — Encounter (INDEPENDENT_AMBULATORY_CARE_PROVIDER_SITE_OTHER): Payer: Self-pay

## 2013-10-22 DIAGNOSIS — M6281 Muscle weakness (generalized): Secondary | ICD-10-CM

## 2013-10-22 DIAGNOSIS — Z0289 Encounter for other administrative examinations: Secondary | ICD-10-CM

## 2013-10-22 DIAGNOSIS — Z8612 Personal history of poliomyelitis: Secondary | ICD-10-CM

## 2013-10-22 NOTE — Progress Notes (Addendum)
Cassie Harvey is a 68 year old patient with a history of polio as a child mainly affecting the left leg. The patient has a 12 year history of burning sensations in the legs, with increasing weakness and fatigability of the legs over time. The patient comes to the office today for EMG and nerve conduction study.  Nerve conduction studies done on the left upper and both lower extremities were unremarkable, without evidence of a peripheral neuropathy. EMG of the left lower extremity shows severe denervation that is end-stage involving muscles innervated by the L2, L3, L4, and S1 nerve roots with relative sparing of the L5 nerve root. The patient had a relatively unremarkable EMG of the left upper extremity. EMG of the anterior thigh muscles on the right leg showed chronic and acute denervation. The possibility of a postpolio syndrome affecting this leg should be considered.  The patient will be sent for CT evaluation of the lumbosacral spine, the patient cannot have MRI as she has a pacemaker.  Blood work done was unremarkable with exception that the sedimentation rate was in the 51 range. This will need to be rechecked after several weeks. [facility] receptor antibody study is still pending.  The patient will followup in 3 or 4 months.

## 2013-10-22 NOTE — Procedures (Signed)
HISTORY:  Cassie Harvey is a 68 year old patient with a history of polio mainly affecting the left leg as a child. Over the last 12 years, the patient had burning sensations and progressive weakness issues involving both legs. The patient is being evaluated for this issue.  NERVE CONDUCTION STUDIES:  Nerve conduction studies were performed on the left upper extremity. The distal motor latencies and motor amplitudes for the median and ulnar nerves were within normal limits. The F wave latencies and nerve conduction velocities for these nerves were also normal. The sensory latencies for the median and ulnar nerves were normal.  Nerve conduction studies were performed on both lower extremities. The distal motor latencies and motor amplitudes for the peroneal and posterior tibial nerves were within normal limits. The nerve conduction velocities for these nerves were also normal. The H reflex latencies were normal. The sensory latencies for the peroneal nerves were within normal limits.   EMG STUDIES:  EMG study was performed on the left upper extremity:  The first dorsal interosseous muscle reveals 2 to 4 K units with full recruitment. No fibrillations or positive waves were noted. The abductor pollicis brevis muscle reveals 2 to 5 K units with decreased recruitment. No fibrillations or positive waves were noted. The extensor indicis proprius muscle reveals 1 to 3 K units with full recruitment. No fibrillations or positive waves were noted. The pronator teres muscle reveals 2 to 3 K units with full recruitment. No fibrillations or positive waves were noted. The biceps muscle reveals 1 to 2 K units with full recruitment. No fibrillations or positive waves were noted. The triceps muscle reveals 2 to 3 K units with full recruitment. No fibrillations or positive waves were noted. The anterior deltoid muscle reveals 2 to 3 K units with full recruitment. No fibrillations or positive waves were  noted. The cervical paraspinal muscles were tested at 2 levels. No abnormalities of insertional activity were seen at either level tested. There was good relaxation.  EMG study was performed on the left lower extremity:  The tibialis anterior muscle reveals 2 to 4K motor units with full recruitment. No fibrillations or positive waves were seen. The peroneus tertius muscle reveals 2 to 6K motor units with decreased recruitment. No fibrillations or positive waves were seen. The medial gastrocnemius muscle reveals no voluntary motor units. No fibrillations or positive waves were seen. The vastus lateralis muscle reveals no voluntary motor units. No fibrillations or positive waves were seen. The iliopsoas muscle reveals no voluntary motor units. No fibrillations or positive waves were seen. The biceps femoris muscle (long head) reveals 2 to 10K motor units with moderately decreased recruitment. No fibrillations or positive waves were seen. The lumbosacral paraspinal muscles were tested at 3 levels, and revealed no abnormalities of insertional activity at all 3 levels tested. There was good relaxation.  A limited study was performed on the right lower extremity. The vastus lateralis muscle reveals 2 to 6 K motor units with decreased recruitment. No fibrillations or positive waves were seen. The iliopsoas muscle reveals 3 to 10 K motor units with 2+ fibrillations and positive waves. A moderate decrease in recruitment was seen.    IMPRESSION:  Nerve conduction studies done on the left upper extremity and both lower extremities were unremarkable, without evidence of a peripheral neuropathy. EMG evaluation of the left lower extremity shows severe, end-stage denervation involving primarily the L2-4 nerve roots and the S1 nerve root innervated muscles of this extremity. There is relative sparing  of the L5 innervated muscles. EMG of the left upper extremity is relatively unremarkable. A limited study on the  right lower extremity with the anterior thigh muscles reveals evidence of acute and chronic denervation. The possibility of a postpolio syndrome affecting this extremity should be considered, but an L2 or L3 radiculopathy need to be excluded.  Jill Alexanders MD 10/22/2013 7:01 PM  [facility] [address] [place], [place] [address]  Phone [phone] Fax [phone]

## 2013-10-23 DIAGNOSIS — M25519 Pain in unspecified shoulder: Secondary | ICD-10-CM | POA: Diagnosis not present

## 2013-10-23 DIAGNOSIS — M25619 Stiffness of unspecified shoulder, not elsewhere classified: Secondary | ICD-10-CM | POA: Diagnosis not present

## 2013-10-23 DIAGNOSIS — S42213B Unspecified displaced fracture of surgical neck of unspecified humerus, initial encounter for open fracture: Secondary | ICD-10-CM | POA: Diagnosis not present

## 2013-10-23 DIAGNOSIS — M6281 Muscle weakness (generalized): Secondary | ICD-10-CM | POA: Diagnosis not present

## 2013-10-25 LAB — TSH: TSH: 2.53 u[IU]/mL (ref 0.450–4.500)

## 2013-10-25 LAB — ANGIOTENSIN CONVERTING ENZYME

## 2013-10-25 LAB — ACETYLCHOLINE RECEPTOR, BINDING: AChR Binding Ab, Serum: <0.03 nmol/L (ref 0.00–0.24)

## 2013-10-25 LAB — RHEUMATOID FACTOR: RHEUMATOID FACTOR: 11.5 [IU]/mL (ref 0.0–13.9)

## 2013-10-25 LAB — SEDIMENTATION RATE: Sed Rate: 51 mm/hr — ABNORMAL HIGH (ref 0–40)

## 2013-10-25 LAB — CK: CK TOTAL: 56 U/L (ref 24–173)

## 2013-10-30 ENCOUNTER — Ambulatory Visit
Admission: RE | Admit: 2013-10-30 | Discharge: 2013-10-30 | Disposition: A | Discharge status: Home / Self Care | Payer: 59 | Source: Ambulatory Visit | Attending: Neurology | Admitting: Neurology

## 2013-10-30 ENCOUNTER — Telehealth: Payer: Self-pay | Admitting: Neurology

## 2013-10-30 DIAGNOSIS — Z8612 Personal history of poliomyelitis: Secondary | ICD-10-CM

## 2013-10-30 DIAGNOSIS — M6281 Muscle weakness (generalized): Secondary | ICD-10-CM

## 2013-10-30 MED ORDER — DULOXETINE HCL 30 MG PO CPEP: 30.0000 mg | ORAL_CAPSULE | Freq: Every day | ORAL | Status: DC

## 2013-10-30 NOTE — Telephone Encounter (Signed)
I called patient. The CT of the lumbosacral spine did not show evidence of nerve root impingement at the L2-3 level on the right as suggested by the EMG. 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 The patient is having burning sensations in the muscles, some occasional muscle cramps. She cannot tolerate gabapentin, we will try Cymbalta.    Lumbar CT 2//5/15:  IMPRESSION: 1. Moderate spinal stenosis at L4-5 due to a broad-based disc bulge and hypertrophy of the ligamentum flavum. No focal neural impingement at L4-5. 2. Multilevel degenerative facet arthritis in the lumbar spine, most prominent L4-5 on the right and L5-S1 on the left.

## 2013-12-19 ENCOUNTER — Encounter (HOSPITAL_COMMUNITY): Payer: Self-pay | Admitting: *Deleted

## 2013-12-22 DIAGNOSIS — Z941 Heart transplant status: Secondary | ICD-10-CM | POA: Diagnosis not present

## 2013-12-22 DIAGNOSIS — Z48298 Encounter for aftercare following other organ transplant: Secondary | ICD-10-CM | POA: Diagnosis not present

## 2013-12-26 ENCOUNTER — Encounter (HOSPITAL_COMMUNITY): Payer: Self-pay | Admitting: Pharmacy Technician

## 2014-01-07 ENCOUNTER — Other Ambulatory Visit: Payer: Self-pay | Admitting: Gastroenterology

## 2014-01-07 DIAGNOSIS — Z941 Heart transplant status: Secondary | ICD-10-CM | POA: Diagnosis not present

## 2014-01-07 DIAGNOSIS — Z48298 Encounter for aftercare following other organ transplant: Secondary | ICD-10-CM | POA: Diagnosis not present

## 2014-01-07 DIAGNOSIS — Z79899 Other long term (current) drug therapy: Secondary | ICD-10-CM | POA: Diagnosis not present

## 2014-01-12 ENCOUNTER — Encounter (HOSPITAL_COMMUNITY)
Admission: RE | Disposition: A | Discharge status: Home / Self Care | Payer: Self-pay | Source: Ambulatory Visit | Attending: Gastroenterology

## 2014-01-12 ENCOUNTER — Encounter (HOSPITAL_COMMUNITY): Payer: 59 | Admitting: Anesthesiology

## 2014-01-12 ENCOUNTER — Ambulatory Visit (HOSPITAL_COMMUNITY)
Admission: RE | Admit: 2014-01-12 | Discharge: 2014-01-12 | Disposition: A | Discharge status: Home / Self Care | Payer: 59 | Source: Ambulatory Visit | Attending: Gastroenterology | Admitting: Gastroenterology

## 2014-01-12 ENCOUNTER — Encounter (HOSPITAL_COMMUNITY): Payer: Self-pay

## 2014-01-12 ENCOUNTER — Ambulatory Visit (HOSPITAL_COMMUNITY): Payer: 59 | Admitting: Anesthesiology

## 2014-01-12 DIAGNOSIS — I10 Essential (primary) hypertension: Secondary | ICD-10-CM | POA: Insufficient documentation

## 2014-01-12 DIAGNOSIS — M1990 Unspecified osteoarthritis, unspecified site: Secondary | ICD-10-CM | POA: Insufficient documentation

## 2014-01-12 DIAGNOSIS — Z7982 Long term (current) use of aspirin: Secondary | ICD-10-CM | POA: Insufficient documentation

## 2014-01-12 DIAGNOSIS — Z883 Allergy status to other anti-infective agents status: Secondary | ICD-10-CM | POA: Insufficient documentation

## 2014-01-12 DIAGNOSIS — Z885 Allergy status to narcotic agent status: Secondary | ICD-10-CM | POA: Insufficient documentation

## 2014-01-12 DIAGNOSIS — Z950 Presence of cardiac pacemaker: Secondary | ICD-10-CM | POA: Insufficient documentation

## 2014-01-12 DIAGNOSIS — Z79899 Other long term (current) drug therapy: Secondary | ICD-10-CM | POA: Insufficient documentation

## 2014-01-12 DIAGNOSIS — M5137 Other intervertebral disc degeneration, lumbosacral region: Secondary | ICD-10-CM | POA: Insufficient documentation

## 2014-01-12 DIAGNOSIS — Z882 Allergy status to sulfonamides status: Secondary | ICD-10-CM | POA: Insufficient documentation

## 2014-01-12 DIAGNOSIS — Z1211 Encounter for screening for malignant neoplasm of colon: Secondary | ICD-10-CM | POA: Insufficient documentation

## 2014-01-12 DIAGNOSIS — R198 Other specified symptoms and signs involving the digestive system and abdomen: Secondary | ICD-10-CM | POA: Diagnosis not present

## 2014-01-12 DIAGNOSIS — M51379 Other intervertebral disc degeneration, lumbosacral region without mention of lumbar back pain or lower extremity pain: Secondary | ICD-10-CM | POA: Insufficient documentation

## 2014-01-12 DIAGNOSIS — G609 Hereditary and idiopathic neuropathy, unspecified: Secondary | ICD-10-CM | POA: Insufficient documentation

## 2014-01-12 DIAGNOSIS — R159 Full incontinence of feces: Secondary | ICD-10-CM | POA: Insufficient documentation

## 2014-01-12 DIAGNOSIS — Z941 Heart transplant status: Secondary | ICD-10-CM | POA: Insufficient documentation

## 2014-01-12 HISTORY — DX: Fracture of unspecified shoulder girdle, part unspecified, initial encounter for closed fracture: S42.90XA

## 2014-01-12 HISTORY — PX: COLONOSCOPY: SHX5424

## 2014-01-12 SURGERY — COLONOSCOPY
Anesthesia: Monitor Anesthesia Care

## 2014-01-12 MED ORDER — LACTATED RINGERS IV SOLN
INTRAVENOUS | Status: DC
  Administered 2014-01-12: 1000 mL via INTRAVENOUS

## 2014-01-12 MED ORDER — SODIUM CHLORIDE 0.9 % IV SOLN
INTRAVENOUS | Status: DC
Start: 2014-01-12 — End: 2014-01-12

## 2014-01-12 MED ORDER — PROPOFOL 10 MG/ML IV BOLUS
INTRAVENOUS | Status: AC
  Filled 2014-01-12: qty 20

## 2014-01-12 MED ORDER — FENTANYL CITRATE 0.05 MG/ML IJ SOLN
INTRAMUSCULAR | Status: AC
Start: 2014-01-12 — End: 2014-01-12
  Filled 2014-01-12: qty 2

## 2014-01-12 MED ORDER — LIDOCAINE HCL (CARDIAC) 20 MG/ML IV SOLN
INTRAVENOUS | Status: AC
  Filled 2014-01-12: qty 5

## 2014-01-12 MED ORDER — PROPOFOL INFUSION 10 MG/ML OPTIME
INTRAVENOUS | Status: DC | PRN
  Administered 2014-01-12: 140 ug/kg/min via INTRAVENOUS

## 2014-01-12 MED ORDER — MIDAZOLAM HCL 2 MG/2ML IJ SOLN
INTRAMUSCULAR | Status: AC
  Filled 2014-01-12: qty 2

## 2014-01-12 MED ORDER — FENTANYL CITRATE 0.05 MG/ML IJ SOLN
INTRAMUSCULAR | Status: AC
  Filled 2014-01-12: qty 2

## 2014-01-12 MED ORDER — KETAMINE HCL 10 MG/ML IJ SOLN
INTRAMUSCULAR | Status: DC | PRN
  Administered 2014-01-12: 15 mg via INTRAVENOUS

## 2014-01-12 MED ORDER — PROMETHAZINE HCL 25 MG/ML IJ SOLN: 6.2500 mg | INTRAMUSCULAR | Status: DC | PRN

## 2014-01-12 MED ORDER — MIDAZOLAM HCL 5 MG/5ML IJ SOLN
INTRAMUSCULAR | Status: DC | PRN
  Administered 2014-01-12 (×2): 1 mg via INTRAVENOUS

## 2014-01-12 MED ORDER — KETAMINE HCL 10 MG/ML IJ SOLN
INTRAMUSCULAR | Status: AC
  Filled 2014-01-12: qty 1

## 2014-01-12 MED ORDER — FENTANYL CITRATE 0.05 MG/ML IJ SOLN
INTRAMUSCULAR | Status: DC | PRN
  Administered 2014-01-12 (×2): 50 ug via INTRAVENOUS

## 2014-01-12 NOTE — Anesthesia Preprocedure Evaluation (Signed)
Anesthesia Evaluation  Patient identified by MRN, date of birth, ID band Patient awake    Reviewed: Allergy & Precautions, H&P , NPO status , Patient's Chart, lab work & pertinent test results  Airway Mallampati: II TM Distance: >3 FB Neck ROM: Full    Dental no notable dental hx.    Pulmonary neg pulmonary ROS,  breath sounds clear to auscultation  Pulmonary exam normal       Cardiovascular hypertension, Pt. on medications + pacemaker Rhythm:Regular Rate:Normal  Heart transplanted 2004    Neuro/Psych negative neurological ROS  negative psych ROS   GI/Hepatic negative GI ROS, Neg liver ROS,   Endo/Other  Immunosuppression  Renal/GU negative Renal ROS  negative genitourinary   Musculoskeletal negative musculoskeletal ROS (+)   Abdominal   Peds negative pediatric ROS (+)  Hematology negative hematology ROS (+)   Anesthesia Other Findings   Reproductive/Obstetrics negative OB ROS                           Anesthesia Physical Anesthesia Plan  ASA: III  Anesthesia Plan: MAC   Post-op Pain Management:    Induction: Intravenous  Airway Management Planned: Nasal Cannula  Additional Equipment:   Intra-op Plan:   Post-operative Plan:   Informed Consent: I have reviewed the patients History and Physical, chart, labs and discussed the procedure including the risks, benefits and alternatives for the proposed anesthesia with the patient or authorized representative who has indicated his/her understanding and acceptance.   Dental advisory given  Plan Discussed with: CRNA and Surgeon  Anesthesia Plan Comments:         Anesthesia Quick Evaluation

## 2014-01-12 NOTE — Anesthesia Postprocedure Evaluation (Signed)
  Anesthesia Post-op Note  Patient: Cassie Harvey  Procedure(s) Performed: Procedure(s) (LRB): COLONOSCOPY (N/A)  Patient Location: PACU  Anesthesia Type: MAC  Level of Consciousness: awake and alert   Airway and Oxygen Therapy: Patient Spontanous Breathing  Post-op Pain: mild  Post-op Assessment: Post-op Vital signs reviewed, Patient's Cardiovascular Status Stable, Respiratory Function Stable, Patent Airway and No signs of Nausea or vomiting  Last Vitals:  Filed Vitals:   01/12/14 1228  BP: 97/49  Pulse: 61  Temp: 37 C  Resp: 12    Post-op Vital Signs: stable   Complications: No apparent anesthesia complications

## 2014-01-12 NOTE — Op Note (Signed)
[place], [id number]   OPERATIVE PROCEDURE REPORT  PATIENT: Cassie Harvey, Cassie Harvey  MR#: [id number] BIRTHDATE: November 29, 1945 GENDER: Female ENDOSCOPIST: Edmonia James, MD ASSISTANT:   William Dalton, technician Luanne Bras, RN CGRN PROCEDURE DATE: 01/12/2014 PRE-PROCEDURE PREPARATION: The patient was prepped with 2 dulcolax tablets, one ten-ounce bottle of magnesium citrate, and a gallon of Golytely the night prior to the procedure.  The patient was fasted for 8 hours prior to the procedure.  PRE-PROCEDURE PHYSICAL: Patient has stable vital signs.  Neck is supple.  There is no JVD, thyromegaly or LAD.  Chest clear to auscultation.  S1 and S2 regular.  Abdomen soft, non-distended, non-tender with NABS. PROCEDURE:     Colonoscopy with multiple cold biopsies. ASA CLASS:     Class IV INDICATIONS:     1.  Change in bowel habits 2. Colorectal cancer screening. MEDICATIONS:     Propofol 130mg  IV, Fentanyl 100 mcg & Versed 2 mg IV  DESCRIPTION OF PROCEDURE: After the risks, benefits, and alternatives of the procedure were thoroughly explained [including a 10% missed rate of cancer and polyps], informed consent was obtained.  Digital rectal exam was performed.  The Pentax Ped Colon [id number]  was introduced through the anus  and advanced to the terminal ileum which was intubated for a short distance , limited by No adverse events experienced.   The quality of the prep was excellent. . Multiple washes were done. Small lesions could be missed. The instrument was then slowly withdrawn as the colon was fully examined.     COLON FINDINGS: There was patchy loss of vascular markingbut the colonic mucosa otherwise appeared normal; random colonic biopsies done to rule out microscopic colitis.  No masses, polyps, diverticula or AVMs were noted.  The appendiceal orifice and the ICV were identified and photographed. The terminal ileum appeared normal.  Retroflexed views revealed no abnormalities.  The patient tolerated the procedure without immediate complications.  The scope was then withdrawn from the patient and the procedure terminated.  TIME TO CECUM:   5 minutes 00 seconds WITHDRAW TIME:  6 minutes 00 seconds  IMPRESSION:     Patchy loss of vascular markings; otherwise, the colonic mucosa appeared normal upto the terminal ileum; random biopsies done to rule out microscopic colitis.     RECOMMENDATIONS:     1.  Await pathology results. 2.  Continue current medications. 3.  High fiber diet with liberal fluid intake. 4.  Out patient follow-up in 2 weeks.   REPEAT EXAM:      In 10 years  for a repeat colonoscopy.  If the patient has any abnormal GI symptoms in the interim, she has been advised to contact the office as soon as possible for further recommendations.   CPT CODES:     [phone], Colonoscopy with Biopsies   DIAGNOSIS CODES:     787.99, Change in bowel habits; V76.51 Colorectal cancer screening   REFERRED BY: [facility]  eSigned:  Dr. Edmonia James, MD 01/12/2014 12:43 PM   PATIENT NAME:  [name], [name] MR#: [id number]

## 2014-01-12 NOTE — Transfer of Care (Signed)
Immediate Anesthesia Transfer of Care Note  Patient: Cassie Harvey  Procedure(s) Performed: Procedure(s): COLONOSCOPY (N/A)  Patient Location: PACU  Anesthesia Type:MAC  Level of Consciousness: awake, alert  and oriented  Airway & Oxygen Therapy: Patient Spontanous Breathing  Post-op Assessment: Report given to PACU RN and Post -op Vital signs reviewed and stable  Post vital signs: Reviewed and stable  Complications: No apparent anesthesia complications

## 2014-01-12 NOTE — H&P (Signed)
Cassie Harvey is an 68 y.o. female.   Chief Complaint: Colorectal cancer screening. HPI: Patient is here for further evaluation of change in bowel habits and fecal incontinence. She has had multiple medical issues listed below and as she has had a cardiac transplant she is having her procedure done at the hospital. Refer to her office notes for further details.  Past Medical History  Diagnosis Date  . Heart transplanted 2004  . Hypertension   . Neuropathy, peripheral   . DJD (degenerative joint disease)   . DDD (degenerative disc disease), lumbar   . Immunosuppression   . Chronic pain   . Urinary frequency   . Mouth ulcers   . 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 Wrist fracture, bilateral     Surgical repair left  . Pacemaker 01/13/2003  . Broken shoulder     right   Past Surgical History  Procedure Laterality Date  . Heart transplant  11/2002  . Pacemaker insertion  [id number]    new dual chamber -medtronic 5076-art/vent leads  . Wrist arthroplasty  2008    left  . Total hip arthroplasty  02/2011    rt  . Shoulder arthroscopy  96,02    right abd left  . Elbow arthroplasty      rt  . Knee arthroscopy  1998    rt  . Ear examination under anesthesia  1977    lt middle ear   . Septoplasty  1992  . Appendectomy    . Colonoscopy    . Tubal ligation  1990  . Cardiac catheterization  2003  . Knee arthroscopy with medial menisectomy Right 01/16/2013    Procedure: RIGHT KNEE ARTHROSCOPY WITH MEDIAL AND LATERAL MENISECTOMY, REMOVAL LOOSE FOREIGN BODY AND STEROID INJECTION;  Surgeon: [name], MD;  Location: [place];  Service: Orthopedics;  Laterality: Right;  DEPRIEDMENT LATERAL MENISCUS AND CHONDROPLASTY  . Injection knee Left 01/16/2013    Procedure: KNEE INJECTION;  Surgeon: [name], MD;  Location: [place];  Service: Orthopedics;  Laterality: Left;  STEROID   . Steriod injection Left 01/16/2013    Procedure: STEROID INJECTION;   Surgeon: [name], MD;  Location: [place];  Service: Orthopedics;  Laterality: Left;  . 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 Pulmonary fibrosis Father    Social History:  reports that she has never smoked. She has never used smokeless tobacco. She reports that she does not drink alcohol or use illicit drugs.  Allergies:  Allergies  Allergen Reactions  . Oxybutynin Shortness Of Breath  . Bacitracin Swelling  . Neomycin Swelling  . Sulfa Antibiotics Rash  . Sulfabenzamide Rash   Medications Prior to Admission  Medication Sig Dispense Refill  . aspirin 81 MG tablet Take 81 mg by mouth daily.      . calcium-vitamin D (OSCAL WITH D) 500-200 MG-UNIT per tablet Take 1 tablet by mouth 2 (two) times daily.      . carvedilol (COREG) 3.125 MG tablet Take 3.125 mg by mouth 2 (two) times daily with a meal.      . cycloSPORINE modified (NEORAL) 100 MG capsule Take 125 mg by mouth 2 (two) times daily. Takes along with 25 mg to equal 125 mg twice daily      . cycloSPORINE modified (NEORAL) 25 MG capsule Take 125 mg by mouth  2 (two) times daily. Takes along with 100 mg to equal 125 twice daily      . docusate sodium (COLACE) 100 MG capsule Take 100 mg by mouth 2 (two) times daily.      . fish oil-omega-3 fatty acids 1000 MG capsule Take 1 g by mouth 3 (three) times daily.       . Multiple Vitamin (MULTIVITAMIN WITH MINERALS) TABS tablet Take 1 tablet by mouth daily.      . mycophenolate (CELLCEPT) 500 MG tablet Take by mouth 2 (two) times daily.      . niacin (NIASPAN) 500 MG CR tablet Take 500 mg by mouth at bedtime.      . ondansetron (ZOFRAN) 8 MG tablet Take by mouth every 8 (eight) hours as needed for nausea.      . solifenacin (VESICARE) 10 MG tablet Take by mouth daily.      . traMADol (ULTRAM) 50 MG tablet Take 50 mg by mouth 3 (three) times daily as needed for moderate pain.        Marland Kitchen tretinoin (RETIN-A) 0.05 % cream Apply 1 application topically at bedtime.       . valACYclovir (VALTREX) 500 MG tablet Take 500 mg by mouth daily.      Marland Kitchen loratadine (CLARITIN) 10 MG tablet Take 10 mg by mouth daily as needed for allergies.        No results found for this or any previous visit (from the past 48 hour(s)). No results found.  Review of Systems  Constitutional: Negative.   HENT: Positive for hearing loss.   Gastrointestinal: Positive for nausea and diarrhea. Negative for heartburn, vomiting, abdominal pain, constipation and blood in stool.  Genitourinary: Negative.   Musculoskeletal: Positive for joint pain.  Skin: Negative.   Neurological: Negative.    Blood pressure 121/77, pulse 61, temperature 98.4 F (36.9 C), temperature source Oral, resp. rate 12, height 5\' 5"  (1.651 m), weight 61.236 kg (135 lb), SpO2 100.00%. Physical Exam  Constitutional: She is oriented to person, place, and time. She appears well-developed and well-nourished.  HENT:  Head: Normocephalic and atraumatic.  Eyes: Conjunctivae and EOM are normal. Pupils are equal, round, and reactive to light.  Neck: Normal range of motion. Neck supple.  Cardiovascular: Normal rate and regular rhythm.   Respiratory: Effort normal and breath sounds normal.  GI: Soft. Bowel sounds are normal.  Musculoskeletal: Normal range of motion.  Neurological: She is alert and oriented to person, place, and time.  Skin: Skin is warm and dry.  Psychiatric: She has a normal mood and affect. Her behavior is normal. Judgment and thought content normal.    Assessment/Plan Change in bowel habits/CRC screening; proceed with a colonoscopy at this time.  Juanita Craver 01/12/2014, 11:53 AM

## 2014-01-12 NOTE — Discharge Instructions (Signed)
Colonoscopy, Care After °Refer to this sheet in the next few weeks. These instructions provide you with information on caring for yourself after your procedure. Your health care provider may also give you more specific instructions. Your treatment has been planned according to current medical practices, but problems sometimes occur. Call your health care provider if you have any problems or questions after your procedure. °WHAT TO EXPECT AFTER THE PROCEDURE  °After your procedure, it is typical to have the following: °· A small amount of blood in your stool. °· Moderate amounts of gas and mild abdominal cramping or bloating. °HOME CARE INSTRUCTIONS °· Do not drive, operate machinery, or sign important documents for 24 hours. °· You may shower and resume your regular physical activities, but move at a slower pace for the first 24 hours. °· Take frequent rest periods for the first 24 hours. °· Walk around or put a warm pack on your abdomen to help reduce abdominal cramping and bloating. °· Drink enough fluids to keep your urine clear or pale yellow. °· You may resume your normal diet as instructed by your health care provider. Avoid heavy or fried foods that are hard to digest. °· Avoid drinking alcohol for 24 hours or as instructed by your health care provider. °· Only take over-the-counter or prescription medicines as directed by your health care provider. °· If a tissue sample (biopsy) was taken during your procedure: °· Do not take aspirin or blood thinners for 7 days, or as instructed by your health care provider. °· Do not drink alcohol for 7 days, or as instructed by your health care provider. °· Eat soft foods for the first 24 hours. °SEEK MEDICAL CARE IF: °You have persistent spotting of blood in your stool 2 3 days after the procedure. °SEEK IMMEDIATE MEDICAL CARE IF: °· You have more than a small spotting of blood in your stool. °· You pass large blood clots in your stool. °· Your abdomen is swollen  (distended). °· You have nausea or vomiting. °· You have a fever. °· You have increasing abdominal pain that is not relieved with medicine. °Document Released: 04/25/2004 Document Revised: 07/02/2013 Document Reviewed: 05/19/2013 °ExitCare® Patient Information ©2014 [employer]. ° °

## 2014-01-13 ENCOUNTER — Encounter (HOSPITAL_COMMUNITY): Payer: Self-pay | Admitting: Gastroenterology

## 2014-01-27 DIAGNOSIS — D239 Other benign neoplasm of skin, unspecified: Secondary | ICD-10-CM | POA: Diagnosis not present

## 2014-01-27 DIAGNOSIS — L821 Other seborrheic keratosis: Secondary | ICD-10-CM | POA: Diagnosis not present

## 2014-01-27 DIAGNOSIS — Z85828 Personal history of other malignant neoplasm of skin: Secondary | ICD-10-CM | POA: Diagnosis not present

## 2014-01-27 DIAGNOSIS — L570 Actinic keratosis: Secondary | ICD-10-CM | POA: Diagnosis not present

## 2014-02-03 DIAGNOSIS — I495 Sick sinus syndrome: Secondary | ICD-10-CM | POA: Diagnosis not present

## 2014-02-09 ENCOUNTER — Other Ambulatory Visit (HOSPITAL_COMMUNITY): Payer: 59

## 2014-02-09 ENCOUNTER — Other Ambulatory Visit: Payer: Self-pay | Admitting: Physician Assistant

## 2014-02-09 NOTE — H&P (Signed)
TOTAL KNEE ADMISSION H&P  Patient is being admitted for right total knee arthroplasty.  Subjective:  Chief Complaint:right knee pain.  HPI: Cassie Harvey, 68 y.o. female, has a history of pain and functional disability in the right knee due to arthritis and has failed non-surgical conservative treatments for greater than 12 weeks to includeNSAID's and/or analgesics, corticosteriod injections, viscosupplementation injections and activity modification.  Onset of symptoms was gradual, starting >10 years ago with gradually worsening course since that time. The patient noted prior procedures on the knee to include  arthroscopy on the bilaterally knee(s).  Patient currently rates pain in the right knee(s) at 7 out of 10 with activity. Patient has night pain, worsening of pain with activity and weight bearing, pain that interferes with activities of daily living, pain with passive range of motion, crepitus and joint swelling.  Patient has evidence of subchondral sclerosis and joint space narrowing by imaging studies. There is no active infection.  Patient Active Problem List   Diagnosis Date Noted  . Muscle weakness 10/15/2013  . History of poliomyelitis 10/15/2013   Past Medical History  Diagnosis Date  . Heart transplanted 2004  . Hypertension   . Neuropathy, peripheral   . DJD (degenerative joint disease)   . DDD (degenerative disc disease), lumbar   . Immunosuppression   . Chronic pain   . Urinary frequency   . Mouth ulcers   . 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 Wrist fracture, bilateral     Surgical repair left  . Pacemaker 01/13/2003  . Broken shoulder     right    Past Surgical History  Procedure Laterality Date  . Heart transplant  11/2002  . Pacemaker insertion  [id number]    new dual chamber -medtronic 5076-art/vent leads  . Wrist arthroplasty  2008    left  . Total hip arthroplasty  02/2011    rt  . Shoulder arthroscopy  96,02    right abd left  . Elbow arthroplasty       rt  . Knee arthroscopy  1998    rt  . Ear examination under anesthesia  1977    lt middle ear   . Septoplasty  1992  . Appendectomy    . Colonoscopy    . Tubal ligation  1990  . Cardiac catheterization  2003  . Knee arthroscopy with medial menisectomy Right 01/16/2013    Procedure: RIGHT KNEE ARTHROSCOPY WITH MEDIAL AND LATERAL MENISECTOMY, REMOVAL LOOSE FOREIGN BODY AND STEROID INJECTION;  Surgeon: [name], MD;  Location: [place];  Service: Orthopedics;  Laterality: Right;  DEPRIEDMENT LATERAL MENISCUS AND CHONDROPLASTY  . Injection knee Left 01/16/2013    Procedure: KNEE INJECTION;  Surgeon: [name], MD;  Location: [place];  Service: Orthopedics;  Laterality: Left;  STEROID   . Steriod injection Left 01/16/2013    Procedure: STEROID INJECTION;  Surgeon: [name], MD;  Location: [place];  Service: Orthopedics;  Laterality: Left;  . 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 Colonoscopy N/A 01/12/2014    Procedure: COLONOSCOPY;  Surgeon: Juanita Craver, MD;  Location: [facility];  Service: Endoscopy;  Laterality: N/A;     (Not in a hospital admission) Allergies  Allergen Reactions  . Oxybutynin Shortness Of Breath  . Bacitracin Swelling  . Neomycin Swelling  . Sulfa Antibiotics Rash  . Sulfabenzamide Rash    History  Substance Use Topics  .  Smoking status: Never Smoker   . Smokeless tobacco: Never Used  . Alcohol Use: No    Family History  Problem Relation Age of Onset  . Pulmonary fibrosis Father      Review of Systems  Constitutional: Negative.   HENT: Negative.   Eyes: Negative.   Respiratory: Negative.   Cardiovascular: Negative.   Gastrointestinal: Negative.   Genitourinary: Negative.   Musculoskeletal: Positive for joint pain.  Skin: Negative.   Neurological: Negative.   Endo/Heme/Allergies: Negative.   Psychiatric/Behavioral: Negative.      Objective:  Physical Exam  Constitutional: She is oriented to person, place, and time. She appears well-developed and well-nourished.  HENT:  Head: Normocephalic and atraumatic.  Eyes: EOM are normal. Pupils are equal, round, and reactive to light.  Neck: Normal range of motion. Neck supple.  GI: Soft. Bowel sounds are normal. She exhibits no distension. There is no tenderness.  Musculoskeletal:  antalgic gait, right greater than left.  Marked grating and crepitus globally in the right knee, but still fairly good extension, flexion to 110 degrees.  On the left she is getting a little bit of increased varus to about neutral position.  Motion is still 0-130.  Very tender medial joint line with some tibiofemoral and patellofemoral crepitus.      Neurological: She is alert and oriented to person, place, and time.  Skin: Skin is warm and dry.  Psychiatric: She has a normal mood and affect. Her behavior is normal. Judgment and thought content normal.    Vital signs in last 24 hours: @VSRANGES @  Labs:   Estimated body mass index is 22.47 kg/(m^2) as calculated from the following:   Height as of 01/12/14: 5\' 5"  (1.651 m).   Weight as of 01/12/14: 61.236 kg (135 lb).   Imaging Review Plain radiographs demonstrate severe degenerative joint disease of the right knee(s). The overall alignment ismild varus. The bone quality appears to be fair for age and reported activity level.  Assessment/Plan:  End stage arthritis, right knee   The patient history, physical examination, clinical judgment of the provider and imaging studies are consistent with end stage degenerative joint disease of the right knee(s) and total knee arthroplasty is deemed medically necessary. The treatment options including medical management, injection therapy arthroscopy and arthroplasty were discussed at length. The risks and benefits of total knee arthroplasty were presented and reviewed. The risks due to aseptic  loosening, infection, stiffness, patella tracking problems, thromboembolic complications and other imponderables were discussed. The patient acknowledged the explanation, agreed to proceed with the plan and consent was signed. Patient is being admitted for inpatient treatment for surgery, pain control, PT, OT, prophylactic antibiotics, VTE prophylaxis, progressive ambulation and ADL's and discharge planning. The patient is planning to be discharged home with home health services

## 2014-02-12 ENCOUNTER — Encounter (HOSPITAL_COMMUNITY): Payer: Self-pay | Admitting: Pharmacy Technician

## 2014-02-18 ENCOUNTER — Other Ambulatory Visit (HOSPITAL_COMMUNITY): Payer: 59

## 2014-02-19 ENCOUNTER — Encounter (HOSPITAL_COMMUNITY)
Admission: RE | Admit: 2014-02-19 | Discharge: 2014-02-19 | Disposition: A | Discharge status: Home / Self Care | Payer: 59 | Source: Ambulatory Visit | Attending: Orthopedic Surgery | Admitting: Orthopedic Surgery

## 2014-02-19 ENCOUNTER — Encounter (HOSPITAL_COMMUNITY): Payer: Self-pay

## 2014-02-19 DIAGNOSIS — Z01812 Encounter for preprocedural laboratory examination: Secondary | ICD-10-CM | POA: Insufficient documentation

## 2014-02-19 HISTORY — DX: Blighted ovum and nonhydatidiform mole: O02.0

## 2014-02-19 HISTORY — DX: Nausea with vomiting, unspecified: R11.2

## 2014-02-19 HISTORY — DX: Other specified postprocedural states: Z98.890

## 2014-02-19 HISTORY — DX: Family history of other specified conditions: Z84.89

## 2014-02-19 HISTORY — DX: Irritable bowel syndrome, unspecified: K58.9

## 2014-02-19 HISTORY — DX: Pneumonia, unspecified organism: J18.9

## 2014-02-19 LAB — TYPE AND SCREEN
ABO/RH(D): A POS
ANTIBODY SCREEN: NEGATIVE

## 2014-02-19 LAB — SURGICAL PCR SCREEN
MRSA, PCR: NEGATIVE
Staphylococcus aureus: NEGATIVE

## 2014-02-19 LAB — ABO/RH: ABO/RH(D): A POS

## 2014-02-19 NOTE — Progress Notes (Signed)
Pt stated that she was told by Baxter Flattery at [facility] that it was okay to stop taking Aspirin.

## 2014-02-19 NOTE — Pre-Procedure Instructions (Signed)
Cassie Harvey  02/19/2014   Your procedure is scheduled on: Wednesday, February 25, 2014  Report to [facility] Short Stay (use Main Entrance "A'') at 6:30 AM.  Call this number if you have problems the morning of surgery: [phone]   Remember:   Do not eat food or drink liquids after midnight.   Take these medicines the morning of surgery with A SIP OF WATER: carvedilol (COREG), cycloSPORINE (NEORAL), gabapentin (NEURONTIN), methadone (DOLOPHINE), mycophenolate (CELLCEPT), valACYclovir (VALTREX),  if needed: loratadine (CLARITIN) for allergies, ondansetron (ZOFRAN) for nausea, traMADol (ULTRAM) for moderate pain  Stop taking Aspirin, vitamins and herbal medications ( Fish oil)  Do not take any NSAIDs ie: Ibuprofen, Advil, Naproxen or any medication containing Aspirin; stop today.  Do not wear jewelry, make-up or nail polish.  Do not wear lotions, powders, or perfumes. You may wear deodorant.  Do not shave 48 hours prior to surgery.   Do not bring valuables to the hospital.  [facility] is not responsible for any belongings or valuables.               Contacts, dentures or bridgework may not be worn into surgery.  Leave suitcase in the car. After surgery it may be brought to your room.  For patients admitted to the hospital, discharge time is determined by your treatment team.               Patients discharged the day of surgery will not be allowed to drive home.  Name and phone number of your driver:   Special Instructions:  Special Instructions:Special Instructions: [facility] - Preparing for Surgery  Before surgery, you can play an important role.  Because skin is not sterile, your skin needs to be as free of germs as possible.  You can reduce the number of germs on you skin by washing with CHG (chlorahexidine gluconate) soap before surgery.  CHG is an antiseptic cleaner which kills germs and bonds with the skin to continue killing germs even after washing.  Please DO NOT use if you have  an allergy to CHG or antibacterial soaps.  If your skin becomes reddened/irritated stop using the CHG and inform your nurse when you arrive at Short Stay.  Do not shave (including legs and underarms) for at least 48 hours prior to the first CHG shower.  You may shave your face.  Please follow these instructions carefully:   1.  Shower with CHG Soap the night before surgery and the morning of Surgery.  2.  If you choose to wash your hair, wash your hair first as usual with your normal shampoo.  3.  After you shampoo, rinse your hair and body thoroughly to remove the Shampoo.  4.  Use CHG as you would any other liquid soap.  You can apply chg directly  to the skin and wash gently with scrungie or a clean washcloth.  5.  Apply the CHG Soap to your body ONLY FROM THE NECK DOWN.  Do not use on open wounds or open sores.  Avoid contact with your eyes, ears, mouth and genitals (private parts).  Wash genitals (private parts) with your normal soap.  6.  Wash thoroughly, paying special attention to the area where your surgery will be performed.  7.  Thoroughly rinse your body with warm water from the neck down.  8.  DO NOT shower/wash with your normal soap after using and rinsing off the CHG Soap.  9.  Pat yourself dry with  a clean towel.            10.  Wear clean pajamas.            11.  Place clean sheets on your bed the night of your first shower and do not sleep with pets.  Day of Surgery  Do not apply any lotions the morning of surgery.  Please wear clean clothes to the hospital/surgery center.   Please read over the following fact sheets that you were given: Pain Booklet, Coughing and Deep Breathing, Blood Transfusion Information, Total Joint Packet, MRSA Information and Surgical Site Infection Prevention

## 2014-02-20 NOTE — Progress Notes (Signed)
Called Dr. Debroah Loop office to request new consent order (pt no longer having a steroid injection done DOS) and an order for an UA (was supposed to have been done by her PCP, but wasn't). Both Dr. Percell Miller and his PA, Mendel Ryder are out of town until Monday, 02/23/14. I spoke with Judeen Hammans, scheduler and she states she will leave message for them.

## 2014-02-23 ENCOUNTER — Other Ambulatory Visit: Payer: Self-pay | Admitting: Physician Assistant

## 2014-02-23 DIAGNOSIS — M5416 Radiculopathy, lumbar region: Secondary | ICD-10-CM | POA: Insufficient documentation

## 2014-02-23 DIAGNOSIS — Z79899 Other long term (current) drug therapy: Secondary | ICD-10-CM | POA: Diagnosis not present

## 2014-02-23 DIAGNOSIS — Z5181 Encounter for therapeutic drug level monitoring: Secondary | ICD-10-CM | POA: Diagnosis not present

## 2014-02-23 DIAGNOSIS — M1990 Unspecified osteoarthritis, unspecified site: Secondary | ICD-10-CM | POA: Diagnosis not present

## 2014-02-23 DIAGNOSIS — IMO0002 Reserved for concepts with insufficient information to code with codable children: Secondary | ICD-10-CM | POA: Diagnosis not present

## 2014-02-23 DIAGNOSIS — Z01812 Encounter for preprocedural laboratory examination: Secondary | ICD-10-CM

## 2014-02-24 MED ORDER — CEFAZOLIN SODIUM-DEXTROSE 2-3 GM-% IV SOLR
2.0000 g | INTRAVENOUS | Status: AC
  Administered 2014-02-25: 2 g via INTRAVENOUS
  Filled 2014-02-24: qty 50

## 2014-02-24 MED ORDER — CHLORHEXIDINE GLUCONATE 4 % EX LIQD
60.0000 mL | Freq: Once | CUTANEOUS | Status: DC
  Filled 2014-02-24: qty 60

## 2014-02-24 NOTE — Progress Notes (Signed)
Anesthesia chart review: Patient is a 68 year old female scheduled for right TKA on 02/25/14 by Dr. Kathryne Hitch.  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 Cardiologist is with [facility] (Dr. Cassandria Santee).  Para Skeans, FNP with [facility] cleared patient with low cardiac risk for this procedure.   EKG on 12/22/13 [facility]) showed a-paced rhythm, LAD, right BBB.  Cardiac cath on 01/07/14 [facility]) showed: Essentially normal coronary arteries.  Echo on 01/07/14 showed: Normal left ventricular systolic function, normal right ventricular systolic function, mild MR, trivial TR, mild TR, no valvular stenosis.  Stress echocardiogram on 12/17/12 showed: Indeterminate. Normal diastolic function, trivial TR, mild MR and TR, catheter in RA and RV. Ventricle comes down in size and wall motion augments with stress.  CXR on 12/22/13 [facility]) showed: unremarkable chest radiograph status post median sternotomy. Pacemaker projects over the left hemothorax.  Labs from 02/17/14 [facility]) noted that include CBC, CMET, PT/PTT.  If UA needed, then it will be done on the day of surgery. T&S done at PAT.  She has been cleared from a cardiac standpoint.  I did update anesthesiologist Dr. Linna Caprice about her history of cardiac transplant with [facility].  Her perioperative device form is on her chart.  George Hugh [facility] Short Stay Center/Anesthesiology Phone [phone] 02/24/2014 10:38 AM

## 2014-02-25 ENCOUNTER — Encounter (HOSPITAL_COMMUNITY): Payer: Self-pay | Admitting: *Deleted

## 2014-02-25 ENCOUNTER — Inpatient Hospital Stay (HOSPITAL_COMMUNITY): Payer: 59 | Admitting: Anesthesiology

## 2014-02-25 ENCOUNTER — Encounter (HOSPITAL_COMMUNITY)
Admission: RE | Disposition: A | Discharge status: Home / Self Care | Payer: Self-pay | Source: Ambulatory Visit | Attending: Orthopedic Surgery

## 2014-02-25 ENCOUNTER — Encounter (HOSPITAL_COMMUNITY): Payer: 59 | Admitting: Vascular Surgery

## 2014-02-25 ENCOUNTER — Inpatient Hospital Stay (HOSPITAL_COMMUNITY)
Admission: RE | Admit: 2014-02-25 | Discharge: 2014-02-26 | DRG: 470 | Disposition: A | Discharge status: Home / Self Care | Payer: 59 | Source: Ambulatory Visit | Attending: Orthopedic Surgery | Admitting: Orthopedic Surgery

## 2014-02-25 ENCOUNTER — Inpatient Hospital Stay (HOSPITAL_COMMUNITY): Payer: 59

## 2014-02-25 DIAGNOSIS — Z96639 Presence of unspecified artificial wrist joint: Secondary | ICD-10-CM

## 2014-02-25 DIAGNOSIS — G609 Hereditary and idiopathic neuropathy, unspecified: Secondary | ICD-10-CM | POA: Diagnosis present

## 2014-02-25 DIAGNOSIS — Z8612 Personal history of poliomyelitis: Secondary | ICD-10-CM

## 2014-02-25 DIAGNOSIS — M179 Osteoarthritis of knee, unspecified: Secondary | ICD-10-CM

## 2014-02-25 DIAGNOSIS — Z941 Heart transplant status: Secondary | ICD-10-CM

## 2014-02-25 DIAGNOSIS — G8929 Other chronic pain: Secondary | ICD-10-CM | POA: Diagnosis present

## 2014-02-25 DIAGNOSIS — I10 Essential (primary) hypertension: Secondary | ICD-10-CM | POA: Diagnosis not present

## 2014-02-25 DIAGNOSIS — M1710 Unilateral primary osteoarthritis, unspecified knee: Principal | ICD-10-CM | POA: Diagnosis present

## 2014-02-25 DIAGNOSIS — Z950 Presence of cardiac pacemaker: Secondary | ICD-10-CM

## 2014-02-25 DIAGNOSIS — IMO0002 Reserved for concepts with insufficient information to code with codable children: Secondary | ICD-10-CM | POA: Diagnosis not present

## 2014-02-25 DIAGNOSIS — Z96649 Presence of unspecified artificial hip joint: Secondary | ICD-10-CM

## 2014-02-25 HISTORY — PX: TOTAL KNEE ARTHROPLASTY: SHX125

## 2014-02-25 LAB — URINE MICROSCOPIC-ADD ON

## 2014-02-25 LAB — URINALYSIS, ROUTINE W REFLEX MICROSCOPIC
Bilirubin Urine: NEGATIVE
Glucose, UA: NEGATIVE mg/dL
Hgb urine dipstick: NEGATIVE
Ketones, ur: NEGATIVE mg/dL
Nitrite: NEGATIVE
PH: 5.5 (ref 5.0–8.0)
PROTEIN: NEGATIVE mg/dL
Specific Gravity, Urine: 1.016 (ref 1.005–1.030)
Urobilinogen, UA: 0.2 mg/dL (ref 0.0–1.0)

## 2014-02-25 SURGERY — ARTHROPLASTY, KNEE, TOTAL
Anesthesia: General | Site: Knee | Laterality: Right

## 2014-02-25 MED ORDER — OXYCODONE HCL 5 MG PO TABS
5.0000 mg | ORAL_TABLET | ORAL | Status: DC | PRN
  Administered 2014-02-25 (×2): 10 mg via ORAL
  Administered 2014-02-25: 5 mg via ORAL
  Administered 2014-02-26 (×3): 10 mg via ORAL
  Filled 2014-02-25 (×5): qty 2

## 2014-02-25 MED ORDER — DARIFENACIN HYDROBROMIDE ER 15 MG PO TB24
15.0000 mg | ORAL_TABLET | Freq: Every day | ORAL | Status: DC
  Administered 2014-02-26: 15 mg via ORAL
  Filled 2014-02-25: qty 1

## 2014-02-25 MED ORDER — ONDANSETRON HCL 4 MG/2ML IJ SOLN
INTRAMUSCULAR | Status: DC | PRN
  Administered 2014-02-25: 4 mg via INTRAVENOUS

## 2014-02-25 MED ORDER — BUPIVACAINE LIPOSOME 1.3 % IJ SUSP
20.0000 mL | INTRAMUSCULAR | Status: DC
  Filled 2014-02-25: qty 20

## 2014-02-25 MED ORDER — PHENOL 1.4 % MT LIQD
1.0000 | OROMUCOSAL | Status: DC | PRN
Start: 2014-02-25 — End: 2014-02-26

## 2014-02-25 MED ORDER — METOCLOPRAMIDE HCL 5 MG/ML IJ SOLN: 5.0000 mg | Freq: Three times a day (TID) | INTRAMUSCULAR | Status: DC | PRN

## 2014-02-25 MED ORDER — CYCLOSPORINE MODIFIED (NEORAL) 25 MG PO CAPS
125.0000 mg | ORAL_CAPSULE | Freq: Two times a day (BID) | ORAL | Status: DC
  Filled 2014-02-25: qty 5

## 2014-02-25 MED ORDER — VALACYCLOVIR HCL 500 MG PO TABS
500.0000 mg | ORAL_TABLET | Freq: Every day | ORAL | Status: DC
Start: 2014-02-25 — End: 2014-02-26
  Administered 2014-02-25 – 2014-02-26 (×2): 500 mg via ORAL
  Filled 2014-02-25 (×2): qty 1

## 2014-02-25 MED ORDER — HYDROMORPHONE HCL PF 1 MG/ML IJ SOLN
0.5000 mg | INTRAMUSCULAR | Status: DC | PRN
  Administered 2014-02-26: 1 mg via INTRAVENOUS
  Filled 2014-02-25: qty 1

## 2014-02-25 MED ORDER — LIDOCAINE HCL (CARDIAC) 20 MG/ML IV SOLN
INTRAVENOUS | Status: AC
  Filled 2014-02-25: qty 5

## 2014-02-25 MED ORDER — POTASSIUM CHLORIDE IN NACL 20-0.9 MEQ/L-% IV SOLN
INTRAVENOUS | Status: DC
  Filled 2014-02-25 (×5): qty 1000

## 2014-02-25 MED ORDER — OXYCODONE HCL 5 MG PO TABS: 5.0000 mg | ORAL_TABLET | Freq: Once | ORAL | Status: DC | PRN

## 2014-02-25 MED ORDER — BUPIVACAINE LIPOSOME 1.3 % IJ SUSP
INTRAMUSCULAR | Status: DC | PRN
Start: 2014-02-25 — End: 2014-02-25
  Administered 2014-02-25: 20 mL

## 2014-02-25 MED ORDER — BISACODYL 5 MG PO TBEC: 5.0000 mg | DELAYED_RELEASE_TABLET | Freq: Every day | ORAL | Status: DC | PRN

## 2014-02-25 MED ORDER — MIDAZOLAM HCL 5 MG/5ML IJ SOLN
INTRAMUSCULAR | Status: DC | PRN
  Administered 2014-02-25: 2 mg via INTRAVENOUS

## 2014-02-25 MED ORDER — ONDANSETRON HCL 4 MG/2ML IJ SOLN
4.0000 mg | Freq: Four times a day (QID) | INTRAMUSCULAR | Status: DC | PRN
Start: 2014-02-25 — End: 2014-02-26

## 2014-02-25 MED ORDER — DEXAMETHASONE 6 MG PO TABS
10.0000 mg | ORAL_TABLET | Freq: Three times a day (TID) | ORAL | Status: AC
  Administered 2014-02-25 – 2014-02-26 (×3): 10 mg via ORAL
  Filled 2014-02-25 (×3): qty 1

## 2014-02-25 MED ORDER — METHADONE HCL 10 MG PO TABS
5.0000 mg | ORAL_TABLET | Freq: Every day | ORAL | Status: DC
Start: 2014-02-25 — End: 2014-02-26
  Administered 2014-02-25: 5 mg via ORAL
  Filled 2014-02-25: qty 1

## 2014-02-25 MED ORDER — ASPIRIN EC 325 MG PO TBEC: 325.0000 mg | DELAYED_RELEASE_TABLET | Freq: Every day | ORAL | Status: DC

## 2014-02-25 MED ORDER — HYDROMORPHONE HCL PF 1 MG/ML IJ SOLN
0.2500 mg | INTRAMUSCULAR | Status: DC | PRN
  Administered 2014-02-25 (×4): 0.5 mg via INTRAVENOUS
  Administered 2014-02-25: 1 mg via INTRAVENOUS

## 2014-02-25 MED ORDER — OXYCODONE HCL 5 MG PO TABS
ORAL_TABLET | ORAL | Status: AC
  Administered 2014-02-25: 5 mg
  Filled 2014-02-25: qty 2

## 2014-02-25 MED ORDER — PROPOFOL 10 MG/ML IV BOLUS
INTRAVENOUS | Status: AC
  Filled 2014-02-25: qty 20

## 2014-02-25 MED ORDER — DOCUSATE SODIUM 100 MG PO CAPS
100.0000 mg | ORAL_CAPSULE | Freq: Two times a day (BID) | ORAL | Status: DC
  Administered 2014-02-26: 100 mg via ORAL
  Filled 2014-02-25 (×2): qty 1

## 2014-02-25 MED ORDER — PROPOFOL 10 MG/ML IV BOLUS
INTRAVENOUS | Status: DC | PRN
  Administered 2014-02-25: 150 mg via INTRAVENOUS

## 2014-02-25 MED ORDER — DIPHENHYDRAMINE HCL 12.5 MG/5ML PO ELIX: 12.5000 mg | ORAL_SOLUTION | ORAL | Status: DC | PRN

## 2014-02-25 MED ORDER — FENTANYL CITRATE 0.05 MG/ML IJ SOLN
INTRAMUSCULAR | Status: AC
  Filled 2014-02-25: qty 5

## 2014-02-25 MED ORDER — MEPERIDINE HCL 25 MG/ML IJ SOLN: 6.2500 mg | INTRAMUSCULAR | Status: DC | PRN

## 2014-02-25 MED ORDER — METHOCARBAMOL 500 MG PO TABS: 500.0000 mg | ORAL_TABLET | Freq: Four times a day (QID) | ORAL | Status: DC

## 2014-02-25 MED ORDER — CLINDAMYCIN PHOSPHATE 600 MG/50ML IV SOLN
600.0000 mg | Freq: Four times a day (QID) | INTRAVENOUS | Status: AC
  Administered 2014-02-25 (×2): 600 mg via INTRAVENOUS
  Filled 2014-02-25 (×2): qty 50

## 2014-02-25 MED ORDER — CYCLOSPORINE MODIFIED (NEORAL) 100 MG PO CAPS: 125.0000 mg | ORAL_CAPSULE | Freq: Two times a day (BID) | ORAL | Status: DC

## 2014-02-25 MED ORDER — FENTANYL CITRATE 0.05 MG/ML IJ SOLN
INTRAMUSCULAR | Status: DC | PRN
  Administered 2014-02-25: 25 ug via INTRAVENOUS
  Administered 2014-02-25: 100 ug via INTRAVENOUS
  Administered 2014-02-25 (×2): 25 ug via INTRAVENOUS

## 2014-02-25 MED ORDER — HYDROMORPHONE HCL PF 1 MG/ML IJ SOLN
INTRAMUSCULAR | Status: AC
  Filled 2014-02-25: qty 1

## 2014-02-25 MED ORDER — ONDANSETRON HCL 4 MG/2ML IJ SOLN
INTRAMUSCULAR | Status: AC
  Filled 2014-02-25: qty 2

## 2014-02-25 MED ORDER — MYCOPHENOLATE MOFETIL 500 MG PO TABS: 500.0000 mg | ORAL_TABLET | Freq: Two times a day (BID) | ORAL | Status: DC

## 2014-02-25 MED ORDER — METHOCARBAMOL 500 MG PO TABS
500.0000 mg | ORAL_TABLET | Freq: Four times a day (QID) | ORAL | Status: DC | PRN
  Administered 2014-02-25 – 2014-02-26 (×3): 500 mg via ORAL
  Filled 2014-02-25 (×4): qty 1

## 2014-02-25 MED ORDER — MENTHOL 3 MG MT LOZG
1.0000 | LOZENGE | OROMUCOSAL | Status: DC | PRN
Start: 2014-02-25 — End: 2014-02-26

## 2014-02-25 MED ORDER — GABAPENTIN 100 MG PO CAPS
100.0000 mg | ORAL_CAPSULE | Freq: Three times a day (TID) | ORAL | Status: DC
  Administered 2014-02-25 – 2014-02-26 (×2): 100 mg via ORAL
  Filled 2014-02-25 (×4): qty 1

## 2014-02-25 MED ORDER — OXYCODONE-ACETAMINOPHEN 5-325 MG PO TABS: 1.0000 | ORAL_TABLET | ORAL | Status: DC | PRN

## 2014-02-25 MED ORDER — NIACIN ER (ANTIHYPERLIPIDEMIC) 500 MG PO TBCR
500.0000 mg | EXTENDED_RELEASE_TABLET | Freq: Every day | ORAL | Status: DC
  Administered 2014-02-25: 500 mg via ORAL
  Filled 2014-02-25 (×2): qty 1

## 2014-02-25 MED ORDER — ALUM & MAG HYDROXIDE-SIMETH 200-200-20 MG/5ML PO SUSP: 30.0000 mL | ORAL | Status: DC | PRN

## 2014-02-25 MED ORDER — LACTATED RINGERS IV SOLN: INTRAVENOUS | Status: DC

## 2014-02-25 MED ORDER — ONDANSETRON HCL 4 MG/2ML IJ SOLN
4.0000 mg | Freq: Once | INTRAMUSCULAR | Status: DC | PRN
Start: 2014-02-25 — End: 2014-02-25

## 2014-02-25 MED ORDER — HYDROMORPHONE HCL PF 1 MG/ML IJ SOLN
INTRAMUSCULAR | Status: AC
  Administered 2014-02-25: 0.5 mg via INTRAVENOUS
  Filled 2014-02-25: qty 2

## 2014-02-25 MED ORDER — CARVEDILOL 3.125 MG PO TABS
3.1250 mg | ORAL_TABLET | Freq: Two times a day (BID) | ORAL | Status: DC
  Administered 2014-02-25 – 2014-02-26 (×2): 3.125 mg via ORAL
  Filled 2014-02-25 (×4): qty 1

## 2014-02-25 MED ORDER — PREDNISONE 10 MG PO TABS
10.0000 mg | ORAL_TABLET | Freq: Every day | ORAL | Status: DC
  Filled 2014-02-25: qty 1

## 2014-02-25 MED ORDER — ONDANSETRON HCL 4 MG PO TABS: 4.0000 mg | ORAL_TABLET | Freq: Three times a day (TID) | ORAL | Status: DC | PRN

## 2014-02-25 MED ORDER — DOCUSATE SODIUM 100 MG PO CAPS: 100.0000 mg | ORAL_CAPSULE | Freq: Two times a day (BID) | ORAL | Status: DC

## 2014-02-25 MED ORDER — SODIUM CHLORIDE 0.9 % IJ SOLN
INTRAMUSCULAR | Status: DC | PRN
  Administered 2014-02-25: 40 mL via INTRAVENOUS

## 2014-02-25 MED ORDER — CYCLOSPORINE MODIFIED (NEORAL) 25 MG PO CAPS
125.0000 mg | ORAL_CAPSULE | Freq: Two times a day (BID) | ORAL | Status: DC
  Administered 2014-02-25 – 2014-02-26 (×2): 125 mg via ORAL
  Filled 2014-02-25 (×4): qty 5

## 2014-02-25 MED ORDER — ACETAMINOPHEN 650 MG RE SUPP: 650.0000 mg | Freq: Four times a day (QID) | RECTAL | Status: DC | PRN

## 2014-02-25 MED ORDER — DEXTROSE 5 % IV SOLN
500.0000 mg | Freq: Four times a day (QID) | INTRAVENOUS | Status: DC | PRN
  Filled 2014-02-25: qty 5

## 2014-02-25 MED ORDER — METHOCARBAMOL 500 MG PO TABS
ORAL_TABLET | ORAL | Status: AC
  Filled 2014-02-25: qty 1

## 2014-02-25 MED ORDER — METHADONE HCL 5 MG PO TABS: 2.5000 mg | ORAL_TABLET | Freq: Three times a day (TID) | ORAL | Status: DC

## 2014-02-25 MED ORDER — PREDNISONE 10 MG PO TABS
10.0000 mg | ORAL_TABLET | Freq: Two times a day (BID) | ORAL | Status: DC
Start: 2014-02-26 — End: 2014-02-26
  Administered 2014-02-26: 10 mg via ORAL
  Filled 2014-02-25 (×2): qty 1

## 2014-02-25 MED ORDER — METHADONE HCL 5 MG PO TABS
2.5000 mg | ORAL_TABLET | Freq: Two times a day (BID) | ORAL | Status: DC
  Administered 2014-02-25 – 2014-02-26 (×2): 2.5 mg via ORAL
  Filled 2014-02-25 (×2): qty 1

## 2014-02-25 MED ORDER — MYCOPHENOLATE MOFETIL 250 MG PO CAPS
500.0000 mg | ORAL_CAPSULE | Freq: Two times a day (BID) | ORAL | Status: DC
  Administered 2014-02-25 – 2014-02-26 (×2): 500 mg via ORAL
  Filled 2014-02-25 (×4): qty 2

## 2014-02-25 MED ORDER — DEXAMETHASONE SODIUM PHOSPHATE 10 MG/ML IJ SOLN
10.0000 mg | Freq: Three times a day (TID) | INTRAMUSCULAR | Status: AC
  Filled 2014-02-25 (×3): qty 1

## 2014-02-25 MED ORDER — ACETAMINOPHEN 325 MG PO TABS: 650.0000 mg | ORAL_TABLET | Freq: Four times a day (QID) | ORAL | Status: DC | PRN

## 2014-02-25 MED ORDER — ONDANSETRON HCL 4 MG PO TABS: 4.0000 mg | ORAL_TABLET | Freq: Four times a day (QID) | ORAL | Status: DC | PRN

## 2014-02-25 MED ORDER — MIDAZOLAM HCL 2 MG/2ML IJ SOLN
INTRAMUSCULAR | Status: AC
  Filled 2014-02-25: qty 2

## 2014-02-25 MED ORDER — TRETINOIN 0.05 % EX CREA: 1.0000 "application " | TOPICAL_CREAM | Freq: Every day | CUTANEOUS | Status: DC

## 2014-02-25 MED ORDER — LIDOCAINE HCL (CARDIAC) 20 MG/ML IV SOLN
INTRAVENOUS | Status: DC | PRN
  Administered 2014-02-25: 60 mg via INTRAVENOUS

## 2014-02-25 MED ORDER — OXYCODONE HCL 5 MG PO TABS
ORAL_TABLET | ORAL | Status: AC
  Administered 2014-02-25: 5 mg via ORAL
  Filled 2014-02-25: qty 1

## 2014-02-25 MED ORDER — PREDNISONE (PAK) 10 MG PO TABS: ORAL_TABLET | Freq: Every day | ORAL | Status: DC

## 2014-02-25 MED ORDER — ASPIRIN EC 325 MG PO TBEC
325.0000 mg | DELAYED_RELEASE_TABLET | Freq: Every day | ORAL | Status: DC
  Administered 2014-02-26: 325 mg via ORAL
  Filled 2014-02-25 (×2): qty 1

## 2014-02-25 MED ORDER — HYDROCORTISONE NA SUCCINATE PF 1000 MG IJ SOLR
INTRAMUSCULAR | Status: DC | PRN
  Administered 2014-02-25: 100 mg via INTRAVENOUS

## 2014-02-25 MED ORDER — ROCURONIUM BROMIDE 50 MG/5ML IV SOLN
INTRAVENOUS | Status: AC
  Filled 2014-02-25: qty 1

## 2014-02-25 MED ORDER — LACTATED RINGERS IV SOLN
INTRAVENOUS | Status: DC | PRN
Start: 2014-02-25 — End: 2014-02-25
  Administered 2014-02-25: 08:00:00 via INTRAVENOUS

## 2014-02-25 MED ORDER — OXYCODONE HCL 5 MG/5ML PO SOLN: 5.0000 mg | Freq: Once | ORAL | Status: DC | PRN

## 2014-02-25 MED ORDER — MYCOPHENOLATE MOFETIL 250 MG PO CAPS
500.0000 mg | ORAL_CAPSULE | Freq: Two times a day (BID) | ORAL | Status: DC
  Filled 2014-02-25: qty 2

## 2014-02-25 MED ORDER — SODIUM CHLORIDE 0.9 % IR SOLN
Status: DC | PRN
Start: 2014-02-25 — End: 2014-02-25
  Administered 2014-02-25 (×3): 1000 mL

## 2014-02-25 MED ORDER — METOCLOPRAMIDE HCL 10 MG PO TABS: 5.0000 mg | ORAL_TABLET | Freq: Three times a day (TID) | ORAL | Status: DC | PRN

## 2014-02-25 SURGICAL SUPPLY — 61 items
BANDAGE ELASTIC 4 VELCRO ST LF (GAUZE/BANDAGES/DRESSINGS) ×2 IMPLANT
BANDAGE ELASTIC 6 VELCRO ST LF (GAUZE/BANDAGES/DRESSINGS) ×2 IMPLANT
BANDAGE ESMARK 6X9 LF (GAUZE/BANDAGES/DRESSINGS) ×1 IMPLANT
BENZOIN TINCTURE PRP APPL 2/3 (GAUZE/BANDAGES/DRESSINGS) ×2 IMPLANT
BLADE SAG 18X100X1.27 (BLADE) ×4 IMPLANT
BNDG ESMARK 6X9 LF (GAUZE/BANDAGES/DRESSINGS) ×2
BOWL SMART MIX CTS (DISPOSABLE) ×2 IMPLANT
CEMENT BONE SIMPLEX SPEEDSET (Cement) ×4 IMPLANT
COVER SURGICAL LIGHT HANDLE (MISCELLANEOUS) ×2 IMPLANT
CUFF TOURNIQUET SINGLE 34IN LL (TOURNIQUET CUFF) ×2 IMPLANT
DRAPE EXTREMITY T 121X128X90 (DRAPE) ×2 IMPLANT
DRAPE PROXIMA HALF (DRAPES) ×2 IMPLANT
DRAPE U-SHAPE 47X51 STRL (DRAPES) ×2 IMPLANT
DRSG ADAPTIC 3X8 NADH LF (GAUZE/BANDAGES/DRESSINGS) ×2 IMPLANT
DRSG PAD ABDOMINAL 8X10 ST (GAUZE/BANDAGES/DRESSINGS) ×2 IMPLANT
DURAPREP 26ML APPLICATOR (WOUND CARE) ×4 IMPLANT
ELECT CAUTERY BLADE 6.4 (BLADE) ×2 IMPLANT
ELECT REM PT RETURN 9FT ADLT (ELECTROSURGICAL) ×2
ELECTRODE REM PT RTRN 9FT ADLT (ELECTROSURGICAL) ×1 IMPLANT
EVACUATOR 1/8 PVC DRAIN (DRAIN) ×2 IMPLANT
FACESHIELD WRAPAROUND (MASK) ×4 IMPLANT
GLOVE BIOGEL PI IND STRL 7.0 (GLOVE) ×2 IMPLANT
GLOVE BIOGEL PI INDICATOR 7.0 (GLOVE) ×2
GLOVE ECLIPSE 6.5 STRL STRAW (GLOVE) ×4 IMPLANT
GLOVE ORTHO TXT STRL SZ7.5 (GLOVE) ×2 IMPLANT
GOWN STRL REUS W/ TWL LRG LVL3 (GOWN DISPOSABLE) ×1 IMPLANT
GOWN STRL REUS W/ TWL XL LVL3 (GOWN DISPOSABLE) ×1 IMPLANT
GOWN STRL REUS W/TWL LRG LVL3 (GOWN DISPOSABLE) ×1
GOWN STRL REUS W/TWL XL LVL3 (GOWN DISPOSABLE) ×1
HANDPIECE INTERPULSE COAX TIP (DISPOSABLE) ×1
IMMOBILIZER KNEE 22 UNIV (SOFTGOODS) ×2 IMPLANT
IMMOBILIZER KNEE 24 THIGH 36 (MISCELLANEOUS) IMPLANT
IMMOBILIZER KNEE 24 UNIV (MISCELLANEOUS)
KIT BASIN OR (CUSTOM PROCEDURE TRAY) ×2 IMPLANT
KIT ROOM TURNOVER OR (KITS) ×2 IMPLANT
KNEE/VIT E POLY LINER LEVEL 1B ×2 IMPLANT
MANIFOLD NEPTUNE II (INSTRUMENTS) ×2 IMPLANT
NEEDLE 18GX1X1/2 (RX/OR ONLY) (NEEDLE) ×2 IMPLANT
NEEDLE 25GX 5/8IN NON SAFETY (NEEDLE) ×2 IMPLANT
NS IRRIG 1000ML POUR BTL (IV SOLUTION) ×2 IMPLANT
PACK TOTAL JOINT (CUSTOM PROCEDURE TRAY) ×2 IMPLANT
PAD ARMBOARD 7.5X6 YLW CONV (MISCELLANEOUS) ×4 IMPLANT
PAD CAST 4YDX4 CTTN HI CHSV (CAST SUPPLIES) ×1 IMPLANT
PADDING CAST COTTON 4X4 STRL (CAST SUPPLIES) ×1
PADDING CAST COTTON 6X4 STRL (CAST SUPPLIES) ×2 IMPLANT
SET HNDPC FAN SPRY TIP SCT (DISPOSABLE) ×1 IMPLANT
SPONGE GAUZE 4X4 12PLY (GAUZE/BANDAGES/DRESSINGS) ×2 IMPLANT
STRIP CLOSURE SKIN 1/2X4 (GAUZE/BANDAGES/DRESSINGS) ×4 IMPLANT
SUCTION FRAZIER TIP 10 FR DISP (SUCTIONS) ×2 IMPLANT
SUT MNCRL AB 4-0 PS2 18 (SUTURE) ×2 IMPLANT
SUT VIC AB 0 CT1 27 (SUTURE)
SUT VIC AB 0 CT1 27XBRD ANBCTR (SUTURE) IMPLANT
SUT VIC AB 1 CT1 27 (SUTURE) ×2
SUT VIC AB 1 CT1 27XBRD ANBCTR (SUTURE) ×2 IMPLANT
SUT VIC AB 2-0 CT1 27 (SUTURE) ×2
SUT VIC AB 2-0 CT1 TAPERPNT 27 (SUTURE) ×2 IMPLANT
SYR 50ML LL SCALE MARK (SYRINGE) ×2 IMPLANT
SYR CONTROL 10ML LL (SYRINGE) ×2 IMPLANT
TOWEL OR 17X24 6PK STRL BLUE (TOWEL DISPOSABLE) ×2 IMPLANT
TOWEL OR 17X26 10 PK STRL BLUE (TOWEL DISPOSABLE) ×2 IMPLANT
WATER STERILE IRR 1000ML POUR (IV SOLUTION) ×4 IMPLANT

## 2014-02-25 NOTE — Anesthesia Preprocedure Evaluation (Addendum)
Anesthesia Evaluation  Patient identified by MRN, date of birth, ID band Patient awake    Reviewed: Allergy & Precautions, H&P , NPO status , Patient's Chart, lab work & pertinent test results  Airway Mallampati: I TM Distance: >3 FB Neck ROM: Full    Dental   Pulmonary          Cardiovascular Exercise Tolerance: Good hypertension, Pt. on medications + pacemaker  Heart Transplant 2004. Good LV by Echo 12/2013   Neuro/Psych    GI/Hepatic   Endo/Other    Renal/GU      Musculoskeletal   Abdominal   Peds  Hematology   Anesthesia Other Findings   Reproductive/Obstetrics                          Anesthesia Physical Anesthesia Plan  ASA: III  Anesthesia Plan: General   Post-op Pain Management:    Induction: Intravenous  Airway Management Planned: LMA  Additional Equipment:   Intra-op Plan:   Post-operative Plan: Extubation in OR  Informed Consent: I have reviewed the patients History and Physical, chart, labs and discussed the procedure including the risks, benefits and alternatives for the proposed anesthesia with the patient or authorized representative who has indicated his/her understanding and acceptance.   Dental advisory given  Plan Discussed with: CRNA and Surgeon  Anesthesia Plan Comments:        Anesthesia Quick Evaluation

## 2014-02-25 NOTE — Evaluation (Signed)
Physical Therapy Evaluation Patient Details Name: Cassie Harvey MRN: [id number] DOB: 08/15/1946 Today's Date: 02/25/2014   History of Present Illness  68 y.o. female s/p right TKA. Significant history of heart transplant, poliomyelitis, and HTN.  Clinical Impression  Pt is s/p right TKA resulting in the deficits listed below (see PT Problem List). Able to ambulate two bouts of 15 feet with min guard and moderate verbal cues this afternoon. Pt will benefit from skilled PT to increase their independence and safety with mobility to allow discharge to the venue listed below. Will follow up with pt tomorrow for further stair, gait, and therapeutic exercise training.     Follow Up Recommendations Home health PT;Supervision/Assistance - 24 hour    Equipment Recommendations  None recommended by PT    Recommendations for Other Services OT consult     Precautions / Restrictions Precautions Precautions: Knee Precaution Comments: Reviewed knee precautions and WB status Required Braces or Orthoses: Knee Immobilizer - Right Restrictions Weight Bearing Restrictions: Yes RLE Weight Bearing: Weight bearing as tolerated      Mobility  Bed Mobility Overal bed mobility: Needs Assistance Bed Mobility: Supine to Sit     Supine to sit: Min guard     General bed mobility comments: Min guard for safety with HOB flat. Verbal cues for technique.  Transfers Overall transfer level: Needs assistance Equipment used: Rolling walker (2 wheeled) Transfers: Sit to/from Stand Sit to Stand: Min assist;+2 safety/equipment         General transfer comment: Min assist for boost to stand with +2 available for safety. Verbal cues for hand placement and to shift weight forward. Leans heavily on RW and requires cues to stand upright with hip and knee extension during transition to stand. Performed from lowest bed setting and BSC. Min guard from [facility]  Ambulation/Gait Ambulation/Gait assistance: Min  guard Ambulation Distance (Feet): 15 Feet (x2) Assistive device: Rolling walker (2 wheeled) Gait Pattern/deviations: Step-to pattern;Decreased step length - left;Decreased stance time - right;Decreased dorsiflexion - left;Antalgic Gait velocity: decreased   General Gait Details: Cues for sequencing of gait. good ability to lead with RLE and left foot drop minimal, with good clearance during swing phase however no heel strike on left. Cues for walker placement within BOS and to take steps "into" walker and not in front of wheels to prevent loss of balance. Good abililty to use UEs for support and no loss of balance during 2 bouts of 15 feet.  Stairs            Wheelchair Mobility    Modified Rankin (Stroke Patients Only)       Balance Overall balance assessment: Needs assistance Sitting-balance support: No upper extremity supported;Feet supported Sitting balance-Leahy Scale: Fair Sitting balance - Comments: sits EOB without assist   Standing balance support: No upper extremity supported;During functional activity Standing balance-Leahy Scale: Fair Standing balance comment: Able to stand briefly without UE support during stand>sit toilet transfer.                             Pertinent Vitals/Pain Pt reports pain as "low" no numerical value given Patient repositioned in chair for comfort. Knee positioned in zero-knee for optimal extension.     Home Living Family/patient expects to be discharged to:: Private residence Living Arrangements: Spouse/significant other Available Help at Discharge: Family;Available 24 hours/day Type of Home: House Home Access: Stairs to enter Entrance Stairs-Rails: Right;Left Entrance Stairs-Number of Steps: 4 Home  Layout: One level Home Equipment: Walker - 2 wheels;Cane - single point;Bedside commode;Shower seat;Transport chair      Prior Function Level of Independence: Independent with assistive device(s)         Comments:  Used assistive devices for ADLs. cane for ambulation     Hand Dominance   Dominant Hand: Right    Extremity/Trunk Assessment   Upper Extremity Assessment: Defer to OT evaluation           Lower Extremity Assessment: RLE deficits/detail;LLE deficits/detail RLE Deficits / Details: decreased strength and ROM, limited assessment due to pain  LLE Deficits / Details: History of poliomyelitis with reported foot drop - MMT ankle dorsisflexion 3+/5, hip flexion 4/5.     Communication   Communication: HOH  Cognition Arousal/Alertness: Awake/alert Behavior During Therapy: WFL for tasks assessed/performed Overall Cognitive Status: Within Functional Limits for tasks assessed                      General Comments General comments (skin integrity, edema, etc.): Reviewed knee precautions and therapeutic exercises to be performed. discussed d/c planning and importance of footsie roll compliance    Exercises Total Joint Exercises Ankle Circles/Pumps: AROM;Both;AAROM;10 reps;Supine;Seated Quad Sets: AROM;Right;10 reps;Seated      Assessment/Plan    PT Assessment Patient needs continued PT services  PT Diagnosis Difficulty walking;Abnormality of gait;Acute pain   PT Problem List Decreased strength;Decreased range of motion;Decreased activity tolerance;Decreased balance;Decreased mobility;Decreased knowledge of use of DME;Decreased knowledge of precautions;Cardiopulmonary status limiting activity;Pain  PT Treatment Interventions DME instruction;Gait training;Stair training;Functional mobility training;Therapeutic activities;Therapeutic exercise;Balance training;Neuromuscular re-education;Patient/family education;Modalities   PT Goals (Current goals can be found in the Care Plan section) Acute Rehab PT Goals Patient Stated Goal: go home PT Goal Formulation: With patient Time For Goal Achievement: 03/04/14 Potential to Achieve Goals: Good    Frequency 7X/week   Barriers to  discharge        Co-evaluation               End of Session Equipment Utilized During Treatment: Gait belt;Right knee immobilizer Activity Tolerance: Patient tolerated treatment well Patient left: in chair;with call bell/phone within reach;with family/visitor present Nurse Communication: Mobility status         Time: 4888-9169 PT Time Calculation (min): 35 min   Charges:   PT Evaluation $Initial PT Evaluation Tier I: 1 Procedure PT Treatments $Gait Training: 8-22 mins $Therapeutic Activity: 8-22 mins   PT G CodesCamille Bal [place], [place] 02/25/2014, 3:25 PM

## 2014-02-25 NOTE — Evaluation (Signed)
Occupational Therapy Evaluation Patient Details Name: Cassie Harvey MRN: [id number] DOB: 1945/12/30 Today's Date: 02/25/2014    History of Present Illness 68 y.o. female s/p right TKA. Significant history of heart transplant, poliomyelitis, and HTN.   Clinical Impression   Pt admitted with the above diagnoses and presents with below problem list. Pt will benefit from continued acute OT to address the below listed deficits and maximize independence with basic ADLs prior to d/c home. Pt reports she was independent with ADLs PTA, needing assistance only with housecleaning. Education on safety during functional mobility and ADLs given to pt and spouse.       Follow Up Recommendations  Supervision/Assistance - 24 hour;No OT follow up    Equipment Recommendations  None recommended by OT    Recommendations for Other Services       Precautions / Restrictions Precautions Precautions: Knee Precaution Comments: Reviewed knee precautions and WB status Required Braces or Orthoses: Knee Immobilizer - Right Knee Immobilizer - Right: On when out of bed or walking Restrictions Weight Bearing Restrictions: Yes RLE Weight Bearing: Weight bearing as tolerated      Mobility Bed Mobility Overal bed mobility: Needs Assistance Bed Mobility: Sit to Supine     Supine to sit: Min guard Sit to supine: Min guard   General bed mobility comments: Min guard for safety with HOB flat. Verbal cues for technique.  Transfers Overall transfer level: Needs assistance Equipment used: Rolling walker (2 wheeled) Transfers: Sit to/from [facility] Sit to Stand: Min assist Stand pivot transfers: Min guard         Balance Overall balance assessment: Needs assistance Sitting-balance support: No upper extremity supported;Feet supported Sitting balance-Leahy Scale: Fair Sitting balance - Comments: sits EOB without assist   Standing balance support: Bilateral upper extremity supported;During  functional activity Standing balance-Leahy Scale: Fair                             ADL Overall ADL's : Needs assistance/impaired Eating/Feeding: Set up;Sitting   Grooming: Set up;Sitting   Upper Body Bathing: Min guard;Sitting   Lower Body Bathing: Sit to/from stand;Minimal assistance;With adaptive equipment   Upper Body Dressing : Set up;Sitting   Lower Body Dressing: Minimal assistance;With adaptive equipment;Sit to/from stand   Toilet Transfer: Ambulation;Comfort height toilet;RW;Minimal assistance   Toileting- Water quality scientist and Hygiene: Min guard;Sit to/from stand   Tub/ Shower Transfer: Min guard;Ambulation;3 in 1;Rolling walker   Functional mobility during ADLs: Min guard;Rolling walker General ADL Comments: pt min A for sit>stand. min A for functional transfers, LB ADLS     Vision                     Perception     Praxis      Pertinent Vitals/Pain No c/o pain. Premedicated.     Hand Dominance Right   Extremity/Trunk Assessment Upper Extremity Assessment Upper Extremity Assessment: Overall WFL for tasks assessed   Lower Extremity Assessment Lower Extremity Assessment: Defer to PT evaluation        Communication    Cognition Arousal/Alertness: Awake/alert Behavior During Therapy: WFL for tasks assessed/performed Overall Cognitive Status: Within Functional Limits for tasks assessed                     General Comments       Exercises      Shoulder Instructions      Home Living Family/patient expects to  be discharged to:: Private residence Living Arrangements: Spouse/significant other Available Help at Discharge: Family;Available 24 hours/day Type of Home: House Home Access: Stairs to enter [employer] of Steps: 4 Entrance Stairs-Rails: Right;Left Home Layout: One level     Bathroom Shower/Tub: Occupational psychologist: Handicapped height Bathroom Accessibility: Yes How  Accessible: Accessible via walker Home Equipment: [place] - 2 wheels;Cane - single point;Bedside commode;Shower seat;Transport chair          Prior Functioning/Environment Level of Independence: Independent with assistive device(s)        Comments: used cane as needed    OT Diagnosis: Acute pain   OT Problem List: Impaired balance (sitting and/or standing);Decreased knowledge of use of DME or AE;Decreased knowledge of precautions;Pain   OT Treatment/Interventions: Self-care/ADL training;Therapeutic exercise;DME and/or AE instruction;Therapeutic activities;Patient/family education;Balance training    OT Goals(Current goals can be found in the care plan section) Acute Rehab OT Goals Patient Stated Goal: get back to doing the things I was doing before OT Goal Formulation: With patient/family Time For Goal Achievement: 03/04/14 Potential to Achieve Goals: Good ADL Goals Pt Will Perform Lower Body Bathing: with supervision;with adaptive equipment;sit to/from stand Pt Will Perform Lower Body Dressing: with supervision;with adaptive equipment;sit to/from stand Pt Will Transfer to Toilet: with supervision;ambulating;bedside commode Pt Will Perform Toileting - Clothing Manipulation and hygiene: with supervision;sit to/from stand Pt Will Perform Tub/Shower Transfer: with supervision;ambulating;3 in 1;rolling walker  OT Frequency: Min 2X/week   Barriers to D/C:            Co-evaluation              End of Session Equipment Utilized During Treatment: Gait belt;Rolling walker;Oxygen;Right knee immobilizer CPM Right Knee CPM Right Knee: Off Right Knee Flexion (Degrees): 60 Right Knee Extension (Degrees): 0 Additional Comments: off at 1406  Activity Tolerance: Patient tolerated treatment well Patient left: in bed;with call bell/phone within reach;with family/visitor present   Time: 1031-5945 OT Time Calculation (min): 30 min Charges:  OT General Charges $OT Visit: 1  Procedure OT Evaluation $Initial OT Evaluation Tier I: 1 Procedure OT Treatments $Self Care/Home Management : 8-22 mins G-Codes:    Tyrone Schimke OTR/L Pager: [phone]   02/25/2014, 4:58 PM

## 2014-02-25 NOTE — Progress Notes (Signed)
Orthopedic Tech Progress Note Patient Details:  Cassie Harvey 1945-10-16 [id number]  CPM Right Knee CPM Right Knee: On Right Knee Flexion (Degrees): 60 Right Knee Extension (Degrees): 0 Additional Comments: Trapeze bar and foot roll   Theodoro Parma Cammer 02/25/2014, 1:31 PM

## 2014-02-25 NOTE — Progress Notes (Signed)
Report given to megan rn as caregiver 

## 2014-02-25 NOTE — Discharge Instructions (Signed)
Total Knee Replacement Care After Refer to this sheet in the next few weeks. These instructions provide you with information on caring for yourself after your procedure. Your caregiver also may give you specific instructions. Your treatment has been planned according to the most current medical practices, but problems sometimes occur. Call your caregiver if you have any problems or questions after your procedure. HOME CARE INSTRUCTIONS   Weight bearing as tolerated.  Take Aspirin 1 tab a day for the next 30 days to prevent blood clots.  May change bandage daily starting on Saturday.  May shower on Monday, but do not soak incision.  May apply ice for up to 20 minutes at a time for pain and swelling.  Follow up appointment in our office in 2 weeks.    See a physical therapist as directed by your caregiver.  Take over-the-counter or prescription medicines for pain, discomfort, or fever only as directed by your caregiver.  Avoid lifting or driving until you are instructed otherwise.  If you have been sent home with a continuous passive motion machine, use it as directed by your caregiver. SEEK MEDICAL CARE IF:  You have difficulty breathing.  Your wound is red, swollen, or has become increasingly painful.  You have pus draining from your wound.  You have a bad smell coming from your wound.  You have persistent bleeding from your wound.  Your wound breaks open after sutures (stitches) or staples have been removed. SEEK IMMEDIATE MEDICAL CARE IF:   You have a fever.  You have a rash.  You have pain or swelling in your calf or thigh.  You have shortness of breath or chest pain.  Your range of motion in your knee is decreasing rather than increasing. MAKE SURE YOU:   Understand these instructions.  Will watch your condition.  Will get help right away if you are not doing well or get worse. Document Released: 03/31/2005 Document Revised: 03/12/2012 Document Reviewed:  10/31/2011 [employer] Patient Information 2014 [place].

## 2014-02-25 NOTE — Discharge Summary (Signed)
Patient ID: Cassie Harvey MRN: [id number] DOB/AGE: May 31, 1946 68 y.o.  Admit date: 02/25/2014 Discharge date: 02/25/2014  Admission Diagnoses:  Active Problems:   DJD (degenerative joint disease) of knee   Discharge Diagnoses:  Same  Past Medical History  Diagnosis Date  . Heart transplanted 2004  . Hypertension   . Neuropathy, peripheral   . DJD (degenerative joint disease)   . DDD (degenerative disc disease), lumbar   . Immunosuppression   . Chronic pain   . Urinary frequency   . Mouth ulcers   . 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 Wrist fracture, bilateral     Surgical repair left  . Pacemaker 01/13/2003  . Broken shoulder     right  . PONV (postoperative nausea and vomiting)   . Family history of anesthesia complication     My father suffered from a dysrythmia  . Pneumonia   . Molar pregnancy     Tx; with chemo  . IBS (irritable bowel syndrome)     Surgeries: Procedure(s): RIGHT TOTAL KNEE ARTHROPLASTY on 02/25/2014   Consultants:    Discharged Condition: Improved  Hospital Course: Cassie Harvey is an 68 y.o. female who was admitted 02/25/2014 for operative treatment of<principal problem not specified>. Patient has severe unremitting pain that affects sleep, daily activities, and work/hobbies. After pre-op clearance the patient was taken to the operating room on 02/25/2014 and underwent  Procedure(s): RIGHT TOTAL KNEE ARTHROPLASTY.    Patient was given perioperative antibiotics: Anti-infectives   Start     Dose/Rate Route Frequency Ordered Stop   02/25/14 1500  valACYclovir (VALTREX) tablet 500 mg     500 mg Oral Daily 02/25/14 1252     02/25/14 1430  clindamycin (CLEOCIN) IVPB 600 mg     600 mg 100 mL/hr over 30 Minutes Intravenous Every 6 hours 02/25/14 1252 02/26/14 0229   02/25/14 0600  ceFAZolin (ANCEF) IVPB 2 g/50 mL premix     2 g 100 mL/hr over 30 Minutes Intravenous On call to O.R. 02/24/14 1424 02/25/14 0840       Patient was given sequential  compression devices, early ambulation, and chemoprophylaxis to prevent DVT.  Patient benefited maximally from hospital stay and there were no complications.    Recent vital signs: Patient Vitals for the past 24 hrs:  BP Temp Temp src Pulse Resp SpO2 Height Weight  02/25/14 1240 135/67 mmHg 97.4 F (36.3 C) Oral 61 17 100 % - -  02/25/14 1215 127/75 mmHg 97.8 F (36.6 C) - 60 10 100 % - -  02/25/14 1200 126/72 mmHg - - 61 13 100 % - -  02/25/14 1130 144/85 mmHg - - 60 15 100 % - -  02/25/14 1114 131/76 mmHg - - 60 15 100 % - -  02/25/14 1100 148/80 mmHg - - 60 10 100 % - -  02/25/14 1045 163/83 mmHg - - 62 15 100 % - -  02/25/14 1030 156/90 mmHg 97.7 F (36.5 C) - 65 14 100 % - -  02/25/14 0706 166/89 mmHg 97.8 F (36.6 C) Oral 79 20 100 % 5\' 5"  (1.651 m) 63.504 kg (140 lb)     Recent laboratory studies: No results found for this basename: WBC, HGB, HCT, PLT, NA, K, CL, CO2, BUN, CREATININE, GLUCOSE, PT, INR, CALCIUM, 2,  in the last 72 hours   Discharge Medications:     Medication List    ASK your doctor about these medications       aspirin  81 MG tablet  Take 81 mg by mouth daily.     calcium-vitamin D 500-200 MG-UNIT per tablet  Commonly known as:  OSCAL WITH D  Take 1 tablet by mouth 2 (two) times daily.     carvedilol 3.125 MG tablet  Commonly known as:  COREG  Take 3.125 mg by mouth 2 (two) times daily with a meal.     cycloSPORINE modified 100 MG capsule  Commonly known as:  NEORAL  Take 125 mg by mouth 2 (two) times daily. Takes along with 25 mg to equal 125 mg twice daily     cycloSPORINE modified 25 MG capsule  Commonly known as:  NEORAL  Take 125 mg by mouth 2 (two) times daily. Takes along with 100 mg to equal 125 twice daily     docusate sodium 100 MG capsule  Commonly known as:  COLACE  Take 100 mg by mouth 2 (two) times daily.     Fish Oil 1000 MG Caps  Take by mouth.     gabapentin 100 MG capsule  Commonly known as:  NEURONTIN  Take 100 mg by  mouth 3 (three) times daily.     loratadine 10 MG tablet  Commonly known as:  CLARITIN  Take 10 mg by mouth daily as needed for allergies.     methadone 5 MG tablet  Commonly known as:  DOLOPHINE  Take 2.5-5 mg by mouth every 8 (eight) hours. 2.5 mg in am, 2.5 mg in afternoon and 5 mg at bedtime     multivitamin with minerals Tabs tablet  Take 1 tablet by mouth daily.     mycophenolate 500 MG tablet  Commonly known as:  CELLCEPT  Take by mouth 2 (two) times daily.     niacin 500 MG CR tablet  Commonly known as:  NIASPAN  Take 500 mg by mouth at bedtime.     ondansetron 8 MG tablet  Commonly known as:  ZOFRAN  Take by mouth every 8 (eight) hours as needed for nausea.     predniSONE 10 MG tablet  Commonly known as:  STERAPRED UNI-PAK  Take by mouth daily.     solifenacin 10 MG tablet  Commonly known as:  VESICARE  Take by mouth daily.     traMADol 50 MG tablet  Commonly known as:  ULTRAM  Take 50 mg by mouth 3 (three) times daily as needed for moderate pain.     tretinoin 0.05 % cream  Commonly known as:  RETIN-A  Apply 1 application topically at bedtime.     valACYclovir 500 MG tablet  Commonly known as:  VALTREX  Take 500 mg by mouth daily.        Diagnostic Studies: Dg Knee Right Port  02/25/2014   CLINICAL DATA:  Postoperative knee  EXAM: PORTABLE RIGHT KNEE - 1-2 VIEW  COMPARISON:  None.  FINDINGS: There is a right knee replacement without malalignment. Postsurgical changes including soft tissue air, soft tissue swelling swelling, and drain are noted.  IMPRESSION: Right knee replacement without malalignment.   Electronically Signed   By: Abelardo Diesel M.D.   On: 02/25/2014 11:20    Disposition: 01-Home or Self Care        Follow-up Information   Follow up with [facility], MD. Schedule an appointment as soon as possible for a visit in 2 weeks.   Specialty:  Orthopedic Surgery   Contact information:   [address] [place]  [address] [phone]  Signed: M. Doran Stabler 02/25/2014, 2:12 PM

## 2014-02-25 NOTE — Interval H&P Note (Signed)
History and Physical Interval Note:  02/25/2014 7:55 AM  Cassie Harvey  has presented today for surgery, with the diagnosis of DJD RIGHT KNEE  The various methods of treatment have been discussed with the patient and family. After consideration of risks, benefits and other options for treatment, the patient has consented to  Procedure(s): RIGHT TOTAL KNEE ARTHROPLASTY (Right) as a surgical intervention .  The patient's history has been reviewed, patient examined, no change in status, stable for surgery.  I have reviewed the patient's chart and labs.  Questions were answered to the patient's satisfaction.     [name]

## 2014-02-25 NOTE — Transfer of Care (Signed)
Immediate Anesthesia Transfer of Care Note  Patient: Cassie Harvey  Procedure(s) Performed: Procedure(s): RIGHT TOTAL KNEE ARTHROPLASTY (Right)  Patient Location: PACU  Anesthesia Type:General  Level of Consciousness: awake, alert  and oriented  Airway & Oxygen Therapy: Patient Spontanous Breathing  Post-op Assessment: Report given to PACU RN  Post vital signs: Reviewed and stable  Complications: No apparent anesthesia complications

## 2014-02-25 NOTE — Progress Notes (Signed)
Utilization review completed.  

## 2014-02-25 NOTE — H&P (View-Only) (Signed)
TOTAL KNEE ADMISSION H&P  Patient is being admitted for right total knee arthroplasty.  Subjective:  Chief Complaint:right knee pain.  HPI: Cassie Harvey, 68 y.o. female, has a history of pain and functional disability in the right knee due to arthritis and has failed non-surgical conservative treatments for greater than 12 weeks to includeNSAID's and/or analgesics, corticosteriod injections, viscosupplementation injections and activity modification.  Onset of symptoms was gradual, starting >10 years ago with gradually worsening course since that time. The patient noted prior procedures on the knee to include  arthroscopy on the bilaterally knee(s).  Patient currently rates pain in the right knee(s) at 7 out of 10 with activity. Patient has night pain, worsening of pain with activity and weight bearing, pain that interferes with activities of daily living, pain with passive range of motion, crepitus and joint swelling.  Patient has evidence of subchondral sclerosis and joint space narrowing by imaging studies. There is no active infection.  Patient Active Problem List   Diagnosis Date Noted  . Muscle weakness 10/15/2013  . History of poliomyelitis 10/15/2013   Past Medical History  Diagnosis Date  . Heart transplanted 2004  . Hypertension   . Neuropathy, peripheral   . DJD (degenerative joint disease)   . DDD (degenerative disc disease), lumbar   . Immunosuppression   . Chronic pain   . Urinary frequency   . Mouth ulcers   . 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 Wrist fracture, bilateral     Surgical repair left  . Pacemaker 01/13/2003  . Broken shoulder     right    Past Surgical History  Procedure Laterality Date  . Heart transplant  11/2002  . Pacemaker insertion  [id number]    new dual chamber -medtronic 5076-art/vent leads  . Wrist arthroplasty  2008    left  . Total hip arthroplasty  02/2011    rt  . Shoulder arthroscopy  96,02    right abd left  . Elbow arthroplasty       rt  . Knee arthroscopy  1998    rt  . Ear examination under anesthesia  1977    lt middle ear   . Septoplasty  1992  . Appendectomy    . Colonoscopy    . Tubal ligation  1990  . Cardiac catheterization  2003  . Knee arthroscopy with medial menisectomy Right 01/16/2013    Procedure: RIGHT KNEE ARTHROSCOPY WITH MEDIAL AND LATERAL MENISECTOMY, REMOVAL LOOSE FOREIGN BODY AND STEROID INJECTION;  Surgeon: [name], MD;  Location: [place];  Service: Orthopedics;  Laterality: Right;  DEPRIEDMENT LATERAL MENISCUS AND CHONDROPLASTY  . Injection knee Left 01/16/2013    Procedure: KNEE INJECTION;  Surgeon: [name], MD;  Location: [facility];  Service: Orthopedics;  Laterality: Left;  STEROID   . Steriod injection Left 01/16/2013    Procedure: STEROID INJECTION;  Surgeon: [name], MD;  Location: [facility];  Service: Orthopedics;  Laterality: Left;  . 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 Colonoscopy N/A 01/12/2014    Procedure: COLONOSCOPY;  Surgeon: Juanita Craver, MD;  Location: [facility];  Service: Endoscopy;  Laterality: N/A;     (Not in a hospital admission) Allergies  Allergen Reactions  . Oxybutynin Shortness Of Breath  . Bacitracin Swelling  . Neomycin Swelling  . Sulfa Antibiotics Rash  . Sulfabenzamide Rash    History  Substance Use Topics  .  Smoking status: Never Smoker   . Smokeless tobacco: Never Used  . Alcohol Use: No    Family History  Problem Relation Age of Onset  . Pulmonary fibrosis Father      Review of Systems  Constitutional: Negative.   HENT: Negative.   Eyes: Negative.   Respiratory: Negative.   Cardiovascular: Negative.   Gastrointestinal: Negative.   Genitourinary: Negative.   Musculoskeletal: Positive for joint pain.  Skin: Negative.   Neurological: Negative.   Endo/Heme/Allergies: Negative.   Psychiatric/Behavioral: Negative.      Objective:  Physical Exam  Constitutional: She is oriented to person, place, and time. She appears well-developed and well-nourished.  HENT:  Head: Normocephalic and atraumatic.  Eyes: EOM are normal. Pupils are equal, round, and reactive to light.  Neck: Normal range of motion. Neck supple.  GI: Soft. Bowel sounds are normal. She exhibits no distension. There is no tenderness.  Musculoskeletal:  antalgic gait, right greater than left.  Marked grating and crepitus globally in the right knee, but still fairly good extension, flexion to 110 degrees.  On the left she is getting a little bit of increased varus to about neutral position.  Motion is still 0-130.  Very tender medial joint line with some tibiofemoral and patellofemoral crepitus.      Neurological: She is alert and oriented to person, place, and time.  Skin: Skin is warm and dry.  Psychiatric: She has a normal mood and affect. Her behavior is normal. Judgment and thought content normal.    Vital signs in last 24 hours: @VSRANGES @  Labs:   Estimated body mass index is 22.47 kg/(m^2) as calculated from the following:   Height as of 01/12/14: 5\' 5"  (1.651 m).   Weight as of 01/12/14: 61.236 kg (135 lb).   Imaging Review Plain radiographs demonstrate severe degenerative joint disease of the right knee(s). The overall alignment ismild varus. The bone quality appears to be fair for age and reported activity level.  Assessment/Plan:  End stage arthritis, right knee   The patient history, physical examination, clinical judgment of the provider and imaging studies are consistent with end stage degenerative joint disease of the right knee(s) and total knee arthroplasty is deemed medically necessary. The treatment options including medical management, injection therapy arthroscopy and arthroplasty were discussed at length. The risks and benefits of total knee arthroplasty were presented and reviewed. The risks due to aseptic  loosening, infection, stiffness, patella tracking problems, thromboembolic complications and other imponderables were discussed. The patient acknowledged the explanation, agreed to proceed with the plan and consent was signed. Patient is being admitted for inpatient treatment for surgery, pain control, PT, OT, prophylactic antibiotics, VTE prophylaxis, progressive ambulation and ADL's and discharge planning. The patient is planning to be discharged home with home health services

## 2014-02-26 LAB — BASIC METABOLIC PANEL
BUN: 25 mg/dL — AB (ref 6–23)
CALCIUM: 8.4 mg/dL (ref 8.4–10.5)
CO2: 26 mEq/L (ref 19–32)
Chloride: 102 mEq/L (ref 96–112)
Creatinine, Ser: 0.92 mg/dL (ref 0.50–1.10)
GFR calc Af Amer: 73 mL/min — ABNORMAL LOW (ref >90)
GFR, EST NON AFRICAN AMERICAN: 63 mL/min — AB (ref >90)
GLUCOSE: 114 mg/dL — AB (ref 70–99)
Potassium: 4.7 mEq/L (ref 3.7–5.3)
Sodium: 141 mEq/L (ref 137–147)

## 2014-02-26 LAB — CBC
HEMATOCRIT: 27.6 % — AB (ref 36.0–46.0)
Hemoglobin: 9.2 g/dL — ABNORMAL LOW (ref 12.0–15.0)
MCH: 35.4 pg — ABNORMAL HIGH (ref 26.0–34.0)
MCHC: 33.3 g/dL (ref 30.0–36.0)
MCV: 106.2 fL — ABNORMAL HIGH (ref 78.0–100.0)
Platelets: 282 10*3/uL (ref 150–400)
RBC: 2.6 MIL/uL — ABNORMAL LOW (ref 3.87–5.11)
RDW: 14.2 % (ref 11.5–15.5)
WBC: 17.8 10*3/uL — ABNORMAL HIGH (ref 4.0–10.5)

## 2014-02-26 LAB — URINE CULTURE
Colony Count: NO GROWTH
Culture: NO GROWTH

## 2014-02-26 NOTE — Progress Notes (Signed)
Physical Therapy Treatment Patient Details Name: Cassie Harvey MRN: [id number] DOB: 1946/01/12 Today's Date: 02/26/2014    History of Present Illness 68 y.o. female s/p right TKA. Significant history of heart transplant, poliomyelitis, and HTN.    PT Comments    Pt requested physical therapy session focus on stair training with husband present prior to d/c. This was performed and all questions have been answered concerning this task. She performs this safely and states her husband will be available for guarding as she ascends/descends steps to enter/exit home. They have no further questions/concerns regarding mobility at this time. Feel pt is adequate for d/c from PT standpoint. Patient will continue to benefit from skilled physical therapy services with HHPT to further improve independence with functional mobility.   Follow Up Recommendations  Home health PT;Supervision/Assistance - 24 hour     Equipment Recommendations  None recommended by PT    Recommendations for Other Services OT consult     Precautions / Restrictions Precautions Precautions: Knee Precaution Comments: Reviewed knee precautions and WB status Required Braces or Orthoses: Knee Immobilizer - Right Restrictions Weight Bearing Restrictions: Yes RLE Weight Bearing: Weight bearing as tolerated    Mobility  Bed Mobility               General bed mobility comments: Pt in chair at start/finish of therapy  Transfers Overall transfer level: Needs assistance Equipment used: Rolling walker (2 wheeled) Transfers: Sit to/from Stand Sit to Stand: Supervision         General transfer comment: Supervision for safety. Continues to lock left knee to stand safely. Leans anteriorly quite significantly and relies heavily on arm rests of chair and rolling walker initially but shows good balance and control. Verbal cues for upright posture once in standing position at rolling walker.  Ambulation/Gait Ambulation/Gait  assistance: Supervision Ambulation Distance (Feet): 20 Feet Assistive device: Rolling walker (2 wheeled) Gait Pattern/deviations: Step-to pattern;Decreased step length - left;Decreased stance time - right;Antalgic;Decreased dorsiflexion - left;Trendelenburg Gait velocity: decreased   General Gait Details: Good stability and walker positioning. No loss of balance. verbal cues for forward gaze. Ambulated short disantce to and from stairs for step training.   Stairs Stairs: Yes Stairs assistance: Min guard Stair Management: One rail Right;Step to pattern;Sideways Number of Stairs: 2 General stair comments: Husband present and observed stair training. Pt performed correct sequence and had no instances of knee instability. Pt kees L knee locked while descending with RLE but this was determined to be adequately safe for d/c at prior therapy session. Husband's questions were answered and both pt and husband feel she is performing this task safely and have no further concerns or questions at this time.  Wheelchair Mobility    Modified Rankin (Stroke Patients Only)       Balance                                    Cognition Arousal/Alertness: Awake/alert Behavior During Therapy: WFL for tasks assessed/performed Overall Cognitive Status: Within Functional Limits for tasks assessed                      Exercises      General Comments General comments (skin integrity, edema, etc.): Pt reports she is feeling pretty well and does not wish to "over-do it" prior to d/c home. She requests to perform stair training while husband observes with PT prior to  d/c.      Pertinent Vitals/Pain Pt with no complaints of pain at this time Patient repositioned in chair for comfort.     Home Living                      Prior Function            PT Goals (current goals can now be found in the care plan section) Acute Rehab PT Goals Patient Stated Goal: go home PT  Goal Formulation: With patient Time For Goal Achievement: 03/04/14 Potential to Achieve Goals: Good Progress towards PT goals: Progressing toward goals    Frequency  7X/week    PT Plan Current plan remains appropriate    Co-evaluation             End of Session Equipment Utilized During Treatment: Gait belt;Right knee immobilizer Activity Tolerance: Patient tolerated treatment well Patient left: in chair;with call bell/phone within reach;with family/visitor present     Time: 5456-2563 PT Time Calculation (min): 14 min  Charges:  $Gait Training: 8-22 mins                    G Codes:      Cassie Harvey, Cassie Harvey  Cassie Harvey 02/26/2014, 4:24 PM

## 2014-02-26 NOTE — Op Note (Signed)
[name], Cassie Harvey  MEDICAL RECORD NO.:  [id number]  LOCATION:  5N25C                        FACILITY:  [facility]  PHYSICIAN:  [name], M.D. DATE OF BIRTH:  02/07/1946  DATE OF PROCEDURE:  02/25/2014 DATE OF DISCHARGE:                              OPERATIVE REPORT   PREOPERATIVE DIAGNOSIS:  Right knee end-stage degenerative arthritis.  POSTOPERATIVE DIAGNOSIS:  Right knee end-stage degenerative arthritis.  PROCEDURE:  Modified minimally invasive right total knee replacement, Stryker triathlon prosthesis, soft tissue balancing, cemented pegged #3 cruciate retaining femoral component, cemented #4 tibial component, 9 mm polyethylene insert CS type, cemented resurfacing 35-mm patellar component.  SURGEON:  [name], M.D..  ASSISTANT:  Doran Stabler, PA-C, present throughout the entire case, necessary for timely completion of procedure.  ANESTHESIA:  General.  ESTIMATED BLOOD LOSS:  Minimal.  SPECIMENS:  None.  CULTURES:  None.  COMPLICATION:  None.  DRESSINGS:  Soft compressive knee immobilizer.  DRAINS:  Hemovac x1.  TOURNIQUET TIME:  1 hour.  PROCEDURE IN DETAIL:  The patient was brought to the operating room, placed on the operating table in supine position.  After adequate anesthesia had been obtained, tourniquet applied.  Prepped and draped in usual sterile fashion.  Exsanguinated with elevation of Esmarch. Tourniquet inflated to 350 mmHg.  Straight incision above the patella down to the tibial tubercle.  Skin and subcutaneous tissue incised. Hemostasis with cautery.  Medial arthrotomy and vastus splitting preserving quad tendon.  Medial capsule release.  Grade 4 changes throughout.  Flexible intramedullary guide distal femur.  An 8-mm resection 5 degrees of valgus.  Using epicondylar axis, the femur was sized, cut, and fitted for a cruciate retaining pegged #3 component. Relatively marked osteopenia  throughout.  Attention turned to the tibia. Extramedullary guide.  A 3-degree posterior slope cut.  Size #4 component.  Patella was very thin.  Just overall for the back sufficiently to drill and size this for a 35 mm component.  Debris cleared throughout the knee.  Trials put in place.  With the 9 mm insert, I was very pleased with alignment, motion, stability balancing, and patellar tracking.  Tibia was marked for rotation and hand reamed. All trials removed.  Copious irrigation with pulse irrigating device. Cement prepared, placed on all components, firmly seated.  Polyethylene attached to tibia, knee reduced.  Patella held with clamp.  After the cement hardened, the knee was irrigated again.  Soft tissues injected with Exparel.  Hemovac was placed and brought out through a separate stab wound.  Arthrotomy closed with #1 Vicryl with a subcutaneous subcuticular closure.  Margins were injected with Marcaine.  Sterile compressive dressing applied.  Tourniquet deflated and removed.  Knee immobilizer applied.  Anesthesia reversed.  Brought to the recovery room.  Tolerated the surgery well.  No complications.     [name], M.D.     DFM/MEDQ  D:  02/25/2014  T:  02/26/2014  Job:  [id number]

## 2014-02-26 NOTE — Care Management Note (Signed)
CARE MANAGEMENT NOTE 02/26/2014  Patient:  Cassie Harvey, Cassie Harvey   Account Number:  [id number]  Date Initiated:  02/26/2014  Documentation initiated by:  Ricki Miller  Subjective/Objective Assessment:   68yr old female s/p right total knee replacement.     Action/Plan:   Patient preoperatively setup with [facility], no changes. Has family support at discharge.   Anticipated DC Date:  02/26/2014   Anticipated DC Plan:  [place]  CM consult      [facility] Choice  HOME HEALTH  DURABLE MEDICAL EQUIPMENT   Choice offered to / List presented to:     DME arranged  [facility]  3-N-1      DME agency  [employer]     [place] arranged  HH-2 PT      [place]   Status of service:  Completed, signed off Medicare Important Message given?   (If response is "NO", the following Medicare IM given date fields will be blank) Date Medicare IM given:   Date Additional Medicare IM given:    Discharge Disposition:  [place]

## 2014-02-26 NOTE — Anesthesia Postprocedure Evaluation (Signed)
Anesthesia Post Note  Patient: Cassie Harvey  Procedure(s) Performed: Procedure(s) (LRB): RIGHT TOTAL KNEE ARTHROPLASTY (Right)  Anesthesia type: general  Patient location: PACU  Post pain: Pain level controlled  Post assessment: Patient's Cardiovascular Status Stable  Last Vitals:  Filed Vitals:   02/26/14 0510  BP: 139/72  Pulse: 95  Temp: 37 C  Resp: 16    Post vital signs: Reviewed and stable  Level of consciousness: sedated  Complications: No apparent anesthesia complications

## 2014-02-26 NOTE — Progress Notes (Signed)
Physical Therapy Treatment Patient Details Name: Cassie Harvey MRN: [id number] DOB: March 11, 1946 Today's Date: 02/26/2014    History of Present Illness 68 y.o. female s/p right TKA. Significant history of heart transplant, poliomyelitis, and HTN.    PT Comments    Pt progressing well towards physical therapy goals, ambulating up to 60 feet with min guard for safety using rolling walker. Completed stair training this AM and performs this task safely however, pt requests this be practiced again this afternoon when husband is present. Patient will continue to benefit from skilled physical therapy services to further improve independence with functional mobility. Will follow up with pt this afternoon for further training.   Follow Up Recommendations  Home health PT;Supervision/Assistance - 24 hour     Equipment Recommendations  None recommended by PT    Recommendations for Other Services OT consult     Precautions / Restrictions Precautions Precautions: Knee Precaution Comments: Reviewed knee precautions and WB status Required Braces or Orthoses: Knee Immobilizer - Right Restrictions Weight Bearing Restrictions: Yes RLE Weight Bearing: Weight bearing as tolerated    Mobility  Bed Mobility Overal bed mobility: Needs Assistance Bed Mobility: Supine to Sit     Supine to sit: Supervision     General bed mobility comments: Progressed to supervision for supine>sit with verbal cues for technique and requiring extra time. Able to bring RLE off of bed without assist.  Transfers Overall transfer level: Needs assistance Equipment used: Rolling walker (2 wheeled) Transfers: Sit to/from Stand Sit to Stand: Min assist         General transfer comment: Min assist for boost to stand. Verbal cues for hand placement and to shift weight forward. Leans heavily on RW and requires cues to stand upright with hip and knee extension during transition to stand. Performed from lowest bed setting  and BSC. Min guard from [facility]. Tends to lock left knee for stability due to hx of polio.  Ambulation/Gait Ambulation/Gait assistance: Min guard Ambulation Distance (Feet): 60 Feet (2 additional bouts of 15 feet each) Assistive device: Rolling walker (2 wheeled) Gait Pattern/deviations: Step-to pattern;Decreased step length - left;Decreased stance time - right;Decreased dorsiflexion - left;Antalgic;Trendelenburg;Trunk flexed Gait velocity: decreased   General Gait Details: Demonstrates correct gait sequencing. Focused on walker positioning while ambulating and use of UEs for support. No heel strike on left but has good foot clearance during swing phase.   Stairs Stairs: Yes Stairs assistance: Min guard Stair Management: One rail Right;Step to pattern;Sideways Number of Stairs: 2 (x2) General stair comments: PT demonstrated correct technique and sequencing for pt to ascend/descend stairs safely using 1 rail on right. Pt able to perform safely at min guard level and states her husband will be able to guard her at home. difficulty descending primarily due to left knee weakness from hx of polio however was able to control descent safely from use of rail. She requests this to be practiced once more prior to d/c.  Wheelchair Mobility    Modified Rankin (Stroke Patients Only)       Balance                                    Cognition Arousal/Alertness: Awake/alert Behavior During Therapy: WFL for tasks assessed/performed Overall Cognitive Status: Within Functional Limits for tasks assessed                      Exercises  General Comments General comments (skin integrity, edema, etc.): Reviewed knee precautions and importance of footsie roll compliance. Pt states she does not tolerate this very well.      Pertinent Vitals/Pain Pt reports pain as low, no numerical value given. Patient repositioned in chair for comfort.     Home Living                       Prior Function            PT Goals (current goals can now be found in the care plan section) Acute Rehab PT Goals Patient Stated Goal: go home PT Goal Formulation: With patient Time For Goal Achievement: 03/04/14 Potential to Achieve Goals: Good Progress towards PT goals: Progressing toward goals    Frequency  7X/week    PT Plan Current plan remains appropriate    Co-evaluation             End of Session Equipment Utilized During Treatment: Gait belt;Right knee immobilizer Activity Tolerance: Patient tolerated treatment well Patient left: in chair;with call bell/phone within reach     Time: 0900-0933 PT Time Calculation (min): 33 min  Charges:  $Gait Training: 8-22 mins $Therapeutic Activity: 8-22 mins                    G CodesCamille Bal [place], [place]  [name] 02/26/2014, 10:34 AM

## 2014-02-26 NOTE — Progress Notes (Signed)
Subjective: 1 Day Post-Op Procedure(s) (LRB): RIGHT TOTAL KNEE ARTHROPLASTY (Right) Patient reports pain as 2 on 0-10 scale.  hgb trending down but no lightheadedness/dizziness.  No nausea/vomiting.  No flatus and no bm as of yet.  Objective: Vital signs in last 24 hours: Temp:  [97.4 F (36.3 C)-98.9 F (37.2 C)] 98.6 F (37 C) (06/04 0510) Pulse Rate:  [60-95] 95 (06/04 0510) Resp:  [10-17] 16 (06/04 0510) BP: (109-163)/(51-90) 139/72 mmHg (06/04 0510) SpO2:  [100 %] 100 % (06/04 0510)  Intake/Output from previous day: 06/03 0701 - 06/04 0700 In: 600 [I.V.:600] Out: 346 [Urine:1; Drains:295; Blood:50] Intake/Output this shift:     Recent Labs  02/26/14 0535  HGB 9.2*    Recent Labs  02/26/14 0535  WBC 17.8*  RBC 2.60*  HCT 27.6*  PLT 282    Recent Labs  02/26/14 0535  NA 141  K 4.7  CL 102  CO2 26  BUN 25*  CREATININE 0.92  GLUCOSE 114*  CALCIUM 8.4   No results found for this basename: LABPT, INR,  in the last 72 hours  Neurologically intact Neurovascular intact Sensation intact distally Intact pulses distally Dorsiflexion/Plantar flexion intact Compartment soft hemovac drain pulled by me today No drainage noted through ace bandage  Assessment/Plan: 1 Day Post-Op Procedure(s) (LRB): RIGHT TOTAL KNEE ARTHROPLASTY (Right) Advance diet Up with therapy Discharge home with home health most likely tomorrow but ok to go today pending pain control and PT dispo wbat RLE  M. Doran Stabler 02/26/2014, 7:20 AM

## 2014-02-27 ENCOUNTER — Encounter (HOSPITAL_COMMUNITY): Payer: Self-pay | Admitting: Orthopedic Surgery

## 2014-05-26 DIAGNOSIS — Z79899 Other long term (current) drug therapy: Secondary | ICD-10-CM | POA: Diagnosis not present

## 2014-05-26 DIAGNOSIS — M5137 Other intervertebral disc degeneration, lumbosacral region: Secondary | ICD-10-CM | POA: Diagnosis not present

## 2014-05-26 DIAGNOSIS — Z5181 Encounter for therapeutic drug level monitoring: Secondary | ICD-10-CM | POA: Diagnosis not present

## 2014-07-07 DIAGNOSIS — Z23 Encounter for immunization: Secondary | ICD-10-CM | POA: Diagnosis not present

## 2014-07-07 DIAGNOSIS — Z941 Heart transplant status: Secondary | ICD-10-CM | POA: Diagnosis not present

## 2014-07-07 DIAGNOSIS — Z48298 Encounter for aftercare following other organ transplant: Secondary | ICD-10-CM | POA: Diagnosis not present

## 2014-07-28 DIAGNOSIS — D229 Melanocytic nevi, unspecified: Secondary | ICD-10-CM | POA: Diagnosis not present

## 2014-07-28 DIAGNOSIS — D492 Neoplasm of unspecified behavior of bone, soft tissue, and skin: Secondary | ICD-10-CM | POA: Diagnosis not present

## 2014-07-28 DIAGNOSIS — C4491 Basal cell carcinoma of skin, unspecified: Secondary | ICD-10-CM | POA: Diagnosis not present

## 2014-07-28 DIAGNOSIS — Q828 Other specified congenital malformations of skin: Secondary | ICD-10-CM | POA: Diagnosis not present

## 2014-07-28 DIAGNOSIS — L821 Other seborrheic keratosis: Secondary | ICD-10-CM | POA: Diagnosis not present

## 2014-08-12 ENCOUNTER — Encounter: Payer: Self-pay | Admitting: Neurology

## 2014-08-17 DIAGNOSIS — Z79899 Other long term (current) drug therapy: Secondary | ICD-10-CM | POA: Diagnosis not present

## 2014-08-17 DIAGNOSIS — M5416 Radiculopathy, lumbar region: Secondary | ICD-10-CM | POA: Diagnosis not present

## 2014-08-17 DIAGNOSIS — G620 Drug-induced polyneuropathy: Secondary | ICD-10-CM | POA: Diagnosis not present

## 2014-08-18 ENCOUNTER — Encounter: Payer: Self-pay | Admitting: Neurology

## 2014-09-08 DIAGNOSIS — I495 Sick sinus syndrome: Secondary | ICD-10-CM | POA: Diagnosis not present

## 2014-10-27 DIAGNOSIS — M545 Low back pain: Secondary | ICD-10-CM | POA: Diagnosis not present

## 2014-12-02 DIAGNOSIS — I10 Essential (primary) hypertension: Secondary | ICD-10-CM | POA: Diagnosis not present

## 2014-12-02 DIAGNOSIS — M5417 Radiculopathy, lumbosacral region: Secondary | ICD-10-CM | POA: Diagnosis not present

## 2014-12-03 ENCOUNTER — Other Ambulatory Visit (HOSPITAL_COMMUNITY): Payer: Self-pay | Admitting: Neurological Surgery

## 2014-12-03 ENCOUNTER — Other Ambulatory Visit: Payer: Self-pay | Admitting: Neurological Surgery

## 2014-12-03 DIAGNOSIS — M5417 Radiculopathy, lumbosacral region: Secondary | ICD-10-CM

## 2014-12-07 ENCOUNTER — Ambulatory Visit (HOSPITAL_COMMUNITY)
Admission: RE | Admit: 2014-12-07 | Discharge: 2014-12-07 | Disposition: A | Discharge status: Home / Self Care | Payer: 59 | Source: Ambulatory Visit | Attending: Neurological Surgery | Admitting: Neurological Surgery

## 2014-12-07 DIAGNOSIS — M5116 Intervertebral disc disorders with radiculopathy, lumbar region: Secondary | ICD-10-CM | POA: Insufficient documentation

## 2014-12-07 DIAGNOSIS — M5416 Radiculopathy, lumbar region: Secondary | ICD-10-CM | POA: Diagnosis not present

## 2014-12-07 DIAGNOSIS — M5417 Radiculopathy, lumbosacral region: Secondary | ICD-10-CM | POA: Insufficient documentation

## 2014-12-07 MED ORDER — IOHEXOL 180 MG/ML  SOLN
20.0000 mL | Freq: Once | INTRAMUSCULAR | Status: AC | PRN
  Administered 2014-12-07: 10 mL via INTRATHECAL

## 2014-12-07 MED ORDER — DIAZEPAM 5 MG PO TABS
ORAL_TABLET | ORAL | Status: AC
  Administered 2014-12-07: 10 mg via ORAL
  Filled 2014-12-07: qty 2

## 2014-12-07 MED ORDER — HYDROCODONE-ACETAMINOPHEN 5-325 MG PO TABS
ORAL_TABLET | ORAL | Status: AC
  Filled 2014-12-07: qty 2

## 2014-12-07 MED ORDER — ONDANSETRON HCL 4 MG/2ML IJ SOLN: 4.0000 mg | Freq: Four times a day (QID) | INTRAMUSCULAR | Status: DC | PRN

## 2014-12-07 MED ORDER — DIAZEPAM 5 MG PO TABS
10.0000 mg | ORAL_TABLET | Freq: Once | ORAL | Status: AC
  Administered 2014-12-07: 10 mg via ORAL

## 2014-12-07 MED ORDER — HYDROCODONE-ACETAMINOPHEN 5-325 MG PO TABS
1.0000 | ORAL_TABLET | ORAL | Status: DC | PRN
  Administered 2014-12-07: 2 via ORAL

## 2014-12-07 NOTE — Procedures (Addendum)
CHIEF COMPLAINT:                                          Pinched nerve with pain in the back and right side.    HISTORY OF PRESENT ILLNESS:                     Cassie Harvey is a 69 year old right-handed individual who has had a history of a heart transplant back in 2004.  She tells me though that for over 20 years she has had significant problems with back pain.  Since 10/23/2014 the pain has been severe radiating around her right side and into the right leg.  Initially it started on her left side and she notes it seemed to transfer over to her right side.  She was seen by Dr. Amada Jupiter in the beginning of February and plain x-rays were performed.  These demonstrate that she has some modest spondylitic changes in the lower lumbar spine.  There is significant amount of muscle spasm causing a slight scoliosis of her lumbar spine.  A CT scan done in 09/2013 demonstrates that she has some modest degenerative changes at L4-5 and L5-S1, but no evidence of any nerve root compromise, central canal stenosis, lateral recess stenosis that would explain an ongoing radiculopathy.    REVIEW OF SYSTEMS:                                    Systems review is notable for wearing of glasses, nausea, hearing loss, balance disturbance, high blood pressure, high cholesterol, leg pain while walking, incontinence, leg weakness, back pain, joint pain and swelling, arthritis, difficulty with concentration, difficulty with speech, difficulty with memory and some history of skin cancer on a 14-point review sheet that is noted in the office.      PAST MEDICAL HISTORY:                                  . Prior Operations:  Her past medical history is significant for having had a heart transplant in 2004.  She provides a detailed summary sheet indicating she had a heart attack in 2003, second heart attack a month later.  She needed a left ventricular assist device, but in 11/2002 underwent a heart transplant.  She had required a  pacemaker, therefore, she cannot have an MRI.  She has had some hip surgery, initially at [facility] with a hip replacement on the right side, and she has also had knee surgery and a total knee replacement on the right side in 02/2014.    . Medications and Allergies:  Her current medications include Neoral cyclosporine 125 mg twice a day, Cellcept twice daily, fish oil, tramadol, Coreg, Zofran, magnesium oxide, Valtrex, Vesicare, tramadol and a baby aspirin daily.  SHE NOTES ALLERGIES TO BACITRACIN, OXYBUTYNIN, CHLORIDE, NEOMYCIN, SULFA DRUGS AND LISINOPRIL, WHICH TENDS TO CAUSE A COUGH.    SOCIAL HISTORY:                                            Her personal history reveals that she does not smoke,  nor does she use any alcohol.  Height and weight have been stable at 5 feet 5 inches and 140 pounds.    PHYSICAL EXAMINATION:                                On physical examination, I note that she stands with a 20-degree forward stoop and has a great deal of difficulty getting into the erect position, which causes a very lancinating, biting pain on that right side.  Her motor function appears to show some modest weakness in the tibialis anterior group on the left side compared to the right side at 4+/5.  Gastrocs strength appears intact.  Her proximal leg strength appears good with good strength in the iliopsoas and the quads as noted on her gait.    IMPRESSION:                                                   Cassie Harvey is a 69 year old individual who has substantial back pain with a right-sided radiculopathy that is primarily pain in the right lower extremity, but a left-sided weakness in the tibialis anterior group.  Given the fact that imaging studies to this point have not been able to substantiate the exact nature of any pathology and only show some modest degenerative changes, I believe that she will need to undergo a myelogram and a post myelogram CAT scan.  I have discussed this with Cassie Harvey today.   She is not on any blood thinners and we will try to expedite its being done so that we can identify whatever pathoanatomy needs to be helped given her extreme pain situation.  We will try to expedite this in the nearest bit and I will give her some pain medication in the form of oxycodone for pain control.   Pre op Dx: Spondylosis with radiculopathy Post op Dx: Lumbar spondylosis with radiculopathy Procedure: Lumbar myelogram Surgeon: Deneka Greenwalt Puncture level: L3-4 Fluid color: Clear colorless Injection: Iohexol 180. 10 mL Findings: I'll degenerative scoliosis apex to left less than 15. No discrete root cutoff, for CT scanning of lumbar spine.

## 2014-12-07 NOTE — Discharge Instructions (Signed)
Myelogram and Lumbar Puncture Discharge Instructions  1. Go home and rest quietly for the next 24 hours.  It is important to lie flat for the next 24 hours.  Get up only to go to the restroom.  You may lie in the bed or on a couch on your back, your stomach, your left side or your right side.  You may have one pillow under your head.  You may have pillows between your knees while you are on your side or under your knees while you are on your back.  2. DO NOT drive today.  Recline the seat as far back as it will go, while still wearing your seat belt, on the way home.  3. You may get up to go to the bathroom as needed.  You may sit up for 10 minutes to eat.  You may resume your normal diet and medications unless otherwise indicated.  4. The incidence of headache, nausea, or vomiting is about 5% (one in 20 patients).  If you develop a headache, lie flat and drink plenty of fluids until the headache goes away.  Caffeinated beverages may be helpful.  If you develop severe nausea and vomiting or a headache that does not go away with flat bed rest, call MD.  5. You may resume normal activities after your 24 hours of bed rest is over; however, do not exert yourself strongly or do any heavy lifting tomorrow.  6. Call your physician for a follow-up appointment.  The results of your myelogram will be sent directly to your physician by the following day.  7. If you have any questions or if complications develop after you arrive home, please call MD.  Discharge instructions have been explained to the patient.  The patient, or the person responsible for the patient, fully understands these instructions.   

## 2014-12-11 DIAGNOSIS — C44619 Basal cell carcinoma of skin of left upper limb, including shoulder: Secondary | ICD-10-CM | POA: Diagnosis not present

## 2015-01-14 ENCOUNTER — Other Ambulatory Visit: Payer: Self-pay | Admitting: Neurology

## 2015-01-28 DIAGNOSIS — Q828 Other specified congenital malformations of skin: Secondary | ICD-10-CM | POA: Diagnosis not present

## 2015-01-28 DIAGNOSIS — L821 Other seborrheic keratosis: Secondary | ICD-10-CM | POA: Diagnosis not present

## 2015-01-28 DIAGNOSIS — D485 Neoplasm of uncertain behavior of skin: Secondary | ICD-10-CM | POA: Diagnosis not present

## 2015-01-28 DIAGNOSIS — D492 Neoplasm of unspecified behavior of bone, soft tissue, and skin: Secondary | ICD-10-CM | POA: Diagnosis not present

## 2015-01-28 DIAGNOSIS — L570 Actinic keratosis: Secondary | ICD-10-CM | POA: Diagnosis not present

## 2015-01-28 DIAGNOSIS — D229 Melanocytic nevi, unspecified: Secondary | ICD-10-CM | POA: Diagnosis not present

## 2015-01-28 DIAGNOSIS — C4491 Basal cell carcinoma of skin, unspecified: Secondary | ICD-10-CM | POA: Diagnosis not present

## 2015-01-28 DIAGNOSIS — C44311 Basal cell carcinoma of skin of nose: Secondary | ICD-10-CM | POA: Diagnosis not present

## 2015-02-09 DIAGNOSIS — Z48298 Encounter for aftercare following other organ transplant: Secondary | ICD-10-CM | POA: Diagnosis not present

## 2015-02-09 DIAGNOSIS — R079 Chest pain, unspecified: Secondary | ICD-10-CM | POA: Diagnosis not present

## 2015-02-09 DIAGNOSIS — Z941 Heart transplant status: Secondary | ICD-10-CM | POA: Diagnosis not present

## 2015-02-09 DIAGNOSIS — I10 Essential (primary) hypertension: Secondary | ICD-10-CM | POA: Diagnosis not present

## 2015-02-18 DIAGNOSIS — R5383 Other fatigue: Secondary | ICD-10-CM | POA: Diagnosis not present

## 2015-02-18 DIAGNOSIS — E7889 Other lipoprotein metabolism disorders: Secondary | ICD-10-CM | POA: Diagnosis not present

## 2015-02-18 DIAGNOSIS — E7800 Pure hypercholesterolemia, unspecified: Secondary | ICD-10-CM | POA: Insufficient documentation

## 2015-02-18 DIAGNOSIS — Z79899 Other long term (current) drug therapy: Secondary | ICD-10-CM | POA: Diagnosis not present

## 2015-02-18 DIAGNOSIS — E7841 Elevated Lipoprotein(a): Secondary | ICD-10-CM | POA: Insufficient documentation

## 2015-03-10 DIAGNOSIS — I495 Sick sinus syndrome: Secondary | ICD-10-CM | POA: Diagnosis not present

## 2015-03-11 DIAGNOSIS — C44311 Basal cell carcinoma of skin of nose: Secondary | ICD-10-CM | POA: Diagnosis not present

## 2015-04-19 DIAGNOSIS — Z79891 Long term (current) use of opiate analgesic: Secondary | ICD-10-CM | POA: Diagnosis not present

## 2015-04-19 DIAGNOSIS — Z5181 Encounter for therapeutic drug level monitoring: Secondary | ICD-10-CM | POA: Diagnosis not present

## 2015-04-19 DIAGNOSIS — M5136 Other intervertebral disc degeneration, lumbar region: Secondary | ICD-10-CM | POA: Diagnosis not present

## 2015-04-19 DIAGNOSIS — M5416 Radiculopathy, lumbar region: Secondary | ICD-10-CM | POA: Diagnosis not present

## 2015-05-03 DIAGNOSIS — Z79899 Other long term (current) drug therapy: Secondary | ICD-10-CM | POA: Diagnosis not present

## 2015-05-03 DIAGNOSIS — E780 Pure hypercholesterolemia: Secondary | ICD-10-CM | POA: Diagnosis not present

## 2015-05-03 DIAGNOSIS — E7889 Other lipoprotein metabolism disorders: Secondary | ICD-10-CM | POA: Diagnosis not present

## 2015-05-03 DIAGNOSIS — R5383 Other fatigue: Secondary | ICD-10-CM | POA: Diagnosis not present

## 2015-05-04 DIAGNOSIS — D8989 Other specified disorders involving the immune mechanism, not elsewhere classified: Secondary | ICD-10-CM | POA: Diagnosis not present

## 2015-05-04 DIAGNOSIS — L814 Other melanin hyperpigmentation: Secondary | ICD-10-CM | POA: Diagnosis not present

## 2015-05-04 DIAGNOSIS — L570 Actinic keratosis: Secondary | ICD-10-CM | POA: Diagnosis not present

## 2015-05-04 DIAGNOSIS — Z85828 Personal history of other malignant neoplasm of skin: Secondary | ICD-10-CM | POA: Diagnosis not present

## 2015-05-04 DIAGNOSIS — D1801 Hemangioma of skin and subcutaneous tissue: Secondary | ICD-10-CM | POA: Diagnosis not present

## 2015-05-04 DIAGNOSIS — Q828 Other specified congenital malformations of skin: Secondary | ICD-10-CM | POA: Diagnosis not present

## 2015-05-04 DIAGNOSIS — D225 Melanocytic nevi of trunk: Secondary | ICD-10-CM | POA: Diagnosis not present

## 2015-05-04 DIAGNOSIS — L821 Other seborrheic keratosis: Secondary | ICD-10-CM | POA: Diagnosis not present

## 2015-06-21 DIAGNOSIS — Z941 Heart transplant status: Secondary | ICD-10-CM | POA: Diagnosis not present

## 2015-06-21 DIAGNOSIS — R399 Unspecified symptoms and signs involving the genitourinary system: Secondary | ICD-10-CM | POA: Diagnosis not present

## 2015-06-21 DIAGNOSIS — Z48298 Encounter for aftercare following other organ transplant: Secondary | ICD-10-CM | POA: Diagnosis not present

## 2015-06-21 DIAGNOSIS — Z9189 Other specified personal risk factors, not elsewhere classified: Secondary | ICD-10-CM | POA: Diagnosis not present

## 2015-07-26 DIAGNOSIS — E785 Hyperlipidemia, unspecified: Secondary | ICD-10-CM | POA: Diagnosis not present

## 2015-07-26 DIAGNOSIS — R8271 Bacteriuria: Secondary | ICD-10-CM | POA: Diagnosis not present

## 2015-07-26 DIAGNOSIS — Z79899 Other long term (current) drug therapy: Secondary | ICD-10-CM | POA: Diagnosis not present

## 2015-07-26 DIAGNOSIS — Z23 Encounter for immunization: Secondary | ICD-10-CM | POA: Diagnosis not present

## 2015-07-26 DIAGNOSIS — G629 Polyneuropathy, unspecified: Secondary | ICD-10-CM | POA: Diagnosis not present

## 2015-07-26 DIAGNOSIS — Z941 Heart transplant status: Secondary | ICD-10-CM | POA: Diagnosis not present

## 2015-07-26 DIAGNOSIS — R159 Full incontinence of feces: Secondary | ICD-10-CM | POA: Diagnosis not present

## 2015-07-26 DIAGNOSIS — Z4821 Encounter for aftercare following heart transplant: Secondary | ICD-10-CM | POA: Diagnosis not present

## 2015-07-26 DIAGNOSIS — Z48298 Encounter for aftercare following other organ transplant: Secondary | ICD-10-CM | POA: Diagnosis not present

## 2015-07-29 DIAGNOSIS — C44311 Basal cell carcinoma of skin of nose: Secondary | ICD-10-CM | POA: Diagnosis not present

## 2015-08-05 DIAGNOSIS — L821 Other seborrheic keratosis: Secondary | ICD-10-CM | POA: Diagnosis not present

## 2015-08-05 DIAGNOSIS — L570 Actinic keratosis: Secondary | ICD-10-CM | POA: Diagnosis not present

## 2015-08-05 DIAGNOSIS — L814 Other melanin hyperpigmentation: Secondary | ICD-10-CM | POA: Diagnosis not present

## 2015-08-05 DIAGNOSIS — Q828 Other specified congenital malformations of skin: Secondary | ICD-10-CM | POA: Diagnosis not present

## 2015-08-05 DIAGNOSIS — D229 Melanocytic nevi, unspecified: Secondary | ICD-10-CM | POA: Diagnosis not present

## 2015-08-05 DIAGNOSIS — D8989 Other specified disorders involving the immune mechanism, not elsewhere classified: Secondary | ICD-10-CM | POA: Diagnosis not present

## 2015-08-05 DIAGNOSIS — D492 Neoplasm of unspecified behavior of bone, soft tissue, and skin: Secondary | ICD-10-CM | POA: Diagnosis not present

## 2015-08-05 DIAGNOSIS — L308 Other specified dermatitis: Secondary | ICD-10-CM | POA: Diagnosis not present

## 2015-08-05 DIAGNOSIS — Z85828 Personal history of other malignant neoplasm of skin: Secondary | ICD-10-CM | POA: Diagnosis not present

## 2015-08-05 DIAGNOSIS — D1801 Hemangioma of skin and subcutaneous tissue: Secondary | ICD-10-CM | POA: Diagnosis not present

## 2015-08-13 DIAGNOSIS — R159 Full incontinence of feces: Secondary | ICD-10-CM | POA: Diagnosis not present

## 2015-08-13 DIAGNOSIS — Z48298 Encounter for aftercare following other organ transplant: Secondary | ICD-10-CM | POA: Diagnosis not present

## 2015-08-13 DIAGNOSIS — Z941 Heart transplant status: Secondary | ICD-10-CM | POA: Diagnosis not present

## 2015-08-13 DIAGNOSIS — Z79899 Other long term (current) drug therapy: Secondary | ICD-10-CM | POA: Diagnosis not present

## 2015-08-13 DIAGNOSIS — Z1211 Encounter for screening for malignant neoplasm of colon: Secondary | ICD-10-CM | POA: Diagnosis not present

## 2015-08-24 DIAGNOSIS — R159 Full incontinence of feces: Secondary | ICD-10-CM | POA: Insufficient documentation

## 2015-08-24 DIAGNOSIS — Z941 Heart transplant status: Secondary | ICD-10-CM | POA: Diagnosis not present

## 2015-08-24 DIAGNOSIS — B91 Sequelae of poliomyelitis: Secondary | ICD-10-CM | POA: Diagnosis not present

## 2015-08-24 DIAGNOSIS — M6289 Other specified disorders of muscle: Secondary | ICD-10-CM | POA: Insufficient documentation

## 2015-08-24 DIAGNOSIS — N8184 Pelvic muscle wasting: Secondary | ICD-10-CM | POA: Diagnosis not present

## 2015-08-24 DIAGNOSIS — M6281 Muscle weakness (generalized): Secondary | ICD-10-CM | POA: Diagnosis not present

## 2015-09-01 DIAGNOSIS — R159 Full incontinence of feces: Secondary | ICD-10-CM | POA: Diagnosis not present

## 2015-09-13 DIAGNOSIS — I495 Sick sinus syndrome: Secondary | ICD-10-CM | POA: Diagnosis not present

## 2015-09-13 DIAGNOSIS — Z45018 Encounter for adjustment and management of other part of cardiac pacemaker: Secondary | ICD-10-CM | POA: Insufficient documentation

## 2015-10-01 DIAGNOSIS — M25561 Pain in right knee: Secondary | ICD-10-CM | POA: Diagnosis not present

## 2015-11-15 DIAGNOSIS — N390 Urinary tract infection, site not specified: Secondary | ICD-10-CM | POA: Diagnosis not present

## 2015-11-15 DIAGNOSIS — J069 Acute upper respiratory infection, unspecified: Secondary | ICD-10-CM | POA: Diagnosis not present

## 2015-11-23 DIAGNOSIS — H9071 Mixed conductive and sensorineural hearing loss, unilateral, right ear, with unrestricted hearing on the contralateral side: Secondary | ICD-10-CM | POA: Diagnosis not present

## 2015-11-23 DIAGNOSIS — H65111 Acute and subacute allergic otitis media (mucoid) (sanguinous) (serous), right ear: Secondary | ICD-10-CM | POA: Diagnosis not present

## 2015-12-01 DIAGNOSIS — M25512 Pain in left shoulder: Secondary | ICD-10-CM | POA: Diagnosis not present

## 2015-12-01 DIAGNOSIS — S42295A Other nondisplaced fracture of upper end of left humerus, initial encounter for closed fracture: Secondary | ICD-10-CM | POA: Diagnosis not present

## 2015-12-10 DIAGNOSIS — Z79899 Other long term (current) drug therapy: Secondary | ICD-10-CM | POA: Diagnosis not present

## 2015-12-10 DIAGNOSIS — Z4821 Encounter for aftercare following heart transplant: Secondary | ICD-10-CM | POA: Diagnosis not present

## 2015-12-22 DIAGNOSIS — S42295D Other nondisplaced fracture of upper end of left humerus, subsequent encounter for fracture with routine healing: Secondary | ICD-10-CM | POA: Diagnosis not present

## 2015-12-28 DIAGNOSIS — R8290 Unspecified abnormal findings in urine: Secondary | ICD-10-CM | POA: Diagnosis not present

## 2015-12-28 DIAGNOSIS — Z4821 Encounter for aftercare following heart transplant: Secondary | ICD-10-CM | POA: Diagnosis not present

## 2015-12-28 DIAGNOSIS — I517 Cardiomegaly: Secondary | ICD-10-CM | POA: Diagnosis not present

## 2015-12-28 DIAGNOSIS — Z48298 Encounter for aftercare following other organ transplant: Secondary | ICD-10-CM | POA: Diagnosis not present

## 2015-12-28 DIAGNOSIS — Z941 Heart transplant status: Secondary | ICD-10-CM | POA: Diagnosis not present

## 2015-12-28 DIAGNOSIS — D899 Disorder involving the immune mechanism, unspecified: Secondary | ICD-10-CM | POA: Diagnosis not present

## 2015-12-28 DIAGNOSIS — Z792 Long term (current) use of antibiotics: Secondary | ICD-10-CM | POA: Diagnosis not present

## 2015-12-28 DIAGNOSIS — Z79899 Other long term (current) drug therapy: Secondary | ICD-10-CM | POA: Diagnosis not present

## 2015-12-30 DIAGNOSIS — M6281 Muscle weakness (generalized): Secondary | ICD-10-CM | POA: Diagnosis not present

## 2015-12-30 DIAGNOSIS — M25612 Stiffness of left shoulder, not elsewhere classified: Secondary | ICD-10-CM | POA: Diagnosis not present

## 2015-12-30 DIAGNOSIS — M25512 Pain in left shoulder: Secondary | ICD-10-CM | POA: Diagnosis not present

## 2015-12-30 DIAGNOSIS — S42295D Other nondisplaced fracture of upper end of left humerus, subsequent encounter for fracture with routine healing: Secondary | ICD-10-CM | POA: Diagnosis not present

## 2015-12-31 DIAGNOSIS — M810 Age-related osteoporosis without current pathological fracture: Secondary | ICD-10-CM | POA: Diagnosis not present

## 2015-12-31 DIAGNOSIS — Z780 Asymptomatic menopausal state: Secondary | ICD-10-CM | POA: Diagnosis not present

## 2016-01-03 DIAGNOSIS — M25612 Stiffness of left shoulder, not elsewhere classified: Secondary | ICD-10-CM | POA: Diagnosis not present

## 2016-01-03 DIAGNOSIS — M25512 Pain in left shoulder: Secondary | ICD-10-CM | POA: Diagnosis not present

## 2016-01-03 DIAGNOSIS — M6281 Muscle weakness (generalized): Secondary | ICD-10-CM | POA: Diagnosis not present

## 2016-01-03 DIAGNOSIS — S42295D Other nondisplaced fracture of upper end of left humerus, subsequent encounter for fracture with routine healing: Secondary | ICD-10-CM | POA: Diagnosis not present

## 2016-01-04 DIAGNOSIS — S42295D Other nondisplaced fracture of upper end of left humerus, subsequent encounter for fracture with routine healing: Secondary | ICD-10-CM | POA: Diagnosis not present

## 2016-01-04 DIAGNOSIS — M25612 Stiffness of left shoulder, not elsewhere classified: Secondary | ICD-10-CM | POA: Diagnosis not present

## 2016-01-04 DIAGNOSIS — M25512 Pain in left shoulder: Secondary | ICD-10-CM | POA: Diagnosis not present

## 2016-01-04 DIAGNOSIS — M6281 Muscle weakness (generalized): Secondary | ICD-10-CM | POA: Diagnosis not present

## 2016-01-10 DIAGNOSIS — M6281 Muscle weakness (generalized): Secondary | ICD-10-CM | POA: Diagnosis not present

## 2016-01-10 DIAGNOSIS — M25512 Pain in left shoulder: Secondary | ICD-10-CM | POA: Diagnosis not present

## 2016-01-10 DIAGNOSIS — M25612 Stiffness of left shoulder, not elsewhere classified: Secondary | ICD-10-CM | POA: Diagnosis not present

## 2016-01-10 DIAGNOSIS — S42295D Other nondisplaced fracture of upper end of left humerus, subsequent encounter for fracture with routine healing: Secondary | ICD-10-CM | POA: Diagnosis not present

## 2016-01-12 DIAGNOSIS — S42295D Other nondisplaced fracture of upper end of left humerus, subsequent encounter for fracture with routine healing: Secondary | ICD-10-CM | POA: Diagnosis not present

## 2016-01-13 DIAGNOSIS — M810 Age-related osteoporosis without current pathological fracture: Secondary | ICD-10-CM | POA: Diagnosis not present

## 2016-01-13 DIAGNOSIS — E559 Vitamin D deficiency, unspecified: Secondary | ICD-10-CM | POA: Diagnosis not present

## 2016-01-17 DIAGNOSIS — M25512 Pain in left shoulder: Secondary | ICD-10-CM | POA: Diagnosis not present

## 2016-01-17 DIAGNOSIS — M25612 Stiffness of left shoulder, not elsewhere classified: Secondary | ICD-10-CM | POA: Diagnosis not present

## 2016-01-17 DIAGNOSIS — M6281 Muscle weakness (generalized): Secondary | ICD-10-CM | POA: Diagnosis not present

## 2016-01-17 DIAGNOSIS — S42295D Other nondisplaced fracture of upper end of left humerus, subsequent encounter for fracture with routine healing: Secondary | ICD-10-CM | POA: Diagnosis not present

## 2016-01-24 DIAGNOSIS — S42295D Other nondisplaced fracture of upper end of left humerus, subsequent encounter for fracture with routine healing: Secondary | ICD-10-CM | POA: Diagnosis not present

## 2016-01-24 DIAGNOSIS — M25512 Pain in left shoulder: Secondary | ICD-10-CM | POA: Diagnosis not present

## 2016-01-24 DIAGNOSIS — M6281 Muscle weakness (generalized): Secondary | ICD-10-CM | POA: Diagnosis not present

## 2016-01-24 DIAGNOSIS — M25612 Stiffness of left shoulder, not elsewhere classified: Secondary | ICD-10-CM | POA: Diagnosis not present

## 2016-01-25 DIAGNOSIS — D2260 Melanocytic nevi of unspecified upper limb, including shoulder: Secondary | ICD-10-CM | POA: Diagnosis not present

## 2016-01-25 DIAGNOSIS — D1800 Hemangioma unspecified site: Secondary | ICD-10-CM | POA: Diagnosis not present

## 2016-01-25 DIAGNOSIS — D899 Disorder involving the immune mechanism, unspecified: Secondary | ICD-10-CM | POA: Diagnosis not present

## 2016-01-25 DIAGNOSIS — Z85828 Personal history of other malignant neoplasm of skin: Secondary | ICD-10-CM | POA: Diagnosis not present

## 2016-01-25 DIAGNOSIS — I8393 Asymptomatic varicose veins of bilateral lower extremities: Secondary | ICD-10-CM | POA: Diagnosis not present

## 2016-01-25 DIAGNOSIS — D2270 Melanocytic nevi of unspecified lower limb, including hip: Secondary | ICD-10-CM | POA: Diagnosis not present

## 2016-01-25 DIAGNOSIS — D225 Melanocytic nevi of trunk: Secondary | ICD-10-CM | POA: Diagnosis not present

## 2016-01-25 DIAGNOSIS — L814 Other melanin hyperpigmentation: Secondary | ICD-10-CM | POA: Diagnosis not present

## 2016-01-25 DIAGNOSIS — L565 Disseminated superficial actinic porokeratosis (DSAP): Secondary | ICD-10-CM | POA: Diagnosis not present

## 2016-01-25 DIAGNOSIS — L821 Other seborrheic keratosis: Secondary | ICD-10-CM | POA: Diagnosis not present

## 2016-01-25 DIAGNOSIS — L309 Dermatitis, unspecified: Secondary | ICD-10-CM | POA: Diagnosis not present

## 2016-01-28 DIAGNOSIS — M810 Age-related osteoporosis without current pathological fracture: Secondary | ICD-10-CM | POA: Diagnosis not present

## 2016-01-28 DIAGNOSIS — E559 Vitamin D deficiency, unspecified: Secondary | ICD-10-CM | POA: Diagnosis not present

## 2016-01-31 DIAGNOSIS — S42295D Other nondisplaced fracture of upper end of left humerus, subsequent encounter for fracture with routine healing: Secondary | ICD-10-CM | POA: Diagnosis not present

## 2016-01-31 DIAGNOSIS — M6281 Muscle weakness (generalized): Secondary | ICD-10-CM | POA: Diagnosis not present

## 2016-01-31 DIAGNOSIS — M25512 Pain in left shoulder: Secondary | ICD-10-CM | POA: Diagnosis not present

## 2016-01-31 DIAGNOSIS — M25612 Stiffness of left shoulder, not elsewhere classified: Secondary | ICD-10-CM | POA: Diagnosis not present

## 2016-02-08 DIAGNOSIS — S42295D Other nondisplaced fracture of upper end of left humerus, subsequent encounter for fracture with routine healing: Secondary | ICD-10-CM | POA: Diagnosis not present

## 2016-02-08 DIAGNOSIS — M6281 Muscle weakness (generalized): Secondary | ICD-10-CM | POA: Diagnosis not present

## 2016-02-08 DIAGNOSIS — M25612 Stiffness of left shoulder, not elsewhere classified: Secondary | ICD-10-CM | POA: Diagnosis not present

## 2016-02-08 DIAGNOSIS — M25512 Pain in left shoulder: Secondary | ICD-10-CM | POA: Diagnosis not present

## 2016-02-09 DIAGNOSIS — S42295D Other nondisplaced fracture of upper end of left humerus, subsequent encounter for fracture with routine healing: Secondary | ICD-10-CM | POA: Diagnosis not present

## 2016-02-14 DIAGNOSIS — M25512 Pain in left shoulder: Secondary | ICD-10-CM | POA: Diagnosis not present

## 2016-02-14 DIAGNOSIS — M6281 Muscle weakness (generalized): Secondary | ICD-10-CM | POA: Diagnosis not present

## 2016-02-14 DIAGNOSIS — S42295D Other nondisplaced fracture of upper end of left humerus, subsequent encounter for fracture with routine healing: Secondary | ICD-10-CM | POA: Diagnosis not present

## 2016-02-14 DIAGNOSIS — M25612 Stiffness of left shoulder, not elsewhere classified: Secondary | ICD-10-CM | POA: Diagnosis not present

## 2016-02-22 DIAGNOSIS — S42295D Other nondisplaced fracture of upper end of left humerus, subsequent encounter for fracture with routine healing: Secondary | ICD-10-CM | POA: Diagnosis not present

## 2016-02-22 DIAGNOSIS — M6281 Muscle weakness (generalized): Secondary | ICD-10-CM | POA: Diagnosis not present

## 2016-02-22 DIAGNOSIS — M25512 Pain in left shoulder: Secondary | ICD-10-CM | POA: Diagnosis not present

## 2016-02-22 DIAGNOSIS — M25612 Stiffness of left shoulder, not elsewhere classified: Secondary | ICD-10-CM | POA: Diagnosis not present

## 2016-02-23 DIAGNOSIS — M810 Age-related osteoporosis without current pathological fracture: Secondary | ICD-10-CM | POA: Diagnosis not present

## 2016-03-07 DIAGNOSIS — D8989 Other specified disorders involving the immune mechanism, not elsewhere classified: Secondary | ICD-10-CM | POA: Diagnosis not present

## 2016-03-07 DIAGNOSIS — D1800 Hemangioma unspecified site: Secondary | ICD-10-CM | POA: Diagnosis not present

## 2016-03-07 DIAGNOSIS — L820 Inflamed seborrheic keratosis: Secondary | ICD-10-CM | POA: Diagnosis not present

## 2016-03-07 DIAGNOSIS — D2260 Melanocytic nevi of unspecified upper limb, including shoulder: Secondary | ICD-10-CM | POA: Diagnosis not present

## 2016-03-07 DIAGNOSIS — L565 Disseminated superficial actinic porokeratosis (DSAP): Secondary | ICD-10-CM | POA: Diagnosis not present

## 2016-03-07 DIAGNOSIS — L821 Other seborrheic keratosis: Secondary | ICD-10-CM | POA: Diagnosis not present

## 2016-03-07 DIAGNOSIS — D492 Neoplasm of unspecified behavior of bone, soft tissue, and skin: Secondary | ICD-10-CM | POA: Diagnosis not present

## 2016-03-07 DIAGNOSIS — D225 Melanocytic nevi of trunk: Secondary | ICD-10-CM | POA: Diagnosis not present

## 2016-03-07 DIAGNOSIS — L814 Other melanin hyperpigmentation: Secondary | ICD-10-CM | POA: Diagnosis not present

## 2016-03-07 DIAGNOSIS — L570 Actinic keratosis: Secondary | ICD-10-CM | POA: Diagnosis not present

## 2016-03-07 DIAGNOSIS — Z85828 Personal history of other malignant neoplasm of skin: Secondary | ICD-10-CM | POA: Diagnosis not present

## 2016-03-07 DIAGNOSIS — D899 Disorder involving the immune mechanism, unspecified: Secondary | ICD-10-CM | POA: Diagnosis not present

## 2016-03-07 DIAGNOSIS — D1801 Hemangioma of skin and subcutaneous tissue: Secondary | ICD-10-CM | POA: Diagnosis not present

## 2016-03-07 DIAGNOSIS — D2270 Melanocytic nevi of unspecified lower limb, including hip: Secondary | ICD-10-CM | POA: Diagnosis not present

## 2016-03-07 DIAGNOSIS — C4491 Basal cell carcinoma of skin, unspecified: Secondary | ICD-10-CM | POA: Diagnosis not present

## 2016-03-09 DIAGNOSIS — M25612 Stiffness of left shoulder, not elsewhere classified: Secondary | ICD-10-CM | POA: Diagnosis not present

## 2016-03-09 DIAGNOSIS — M6281 Muscle weakness (generalized): Secondary | ICD-10-CM | POA: Diagnosis not present

## 2016-03-09 DIAGNOSIS — M25512 Pain in left shoulder: Secondary | ICD-10-CM | POA: Diagnosis not present

## 2016-03-09 DIAGNOSIS — S42295D Other nondisplaced fracture of upper end of left humerus, subsequent encounter for fracture with routine healing: Secondary | ICD-10-CM | POA: Diagnosis not present

## 2016-03-10 DIAGNOSIS — H9071 Mixed conductive and sensorineural hearing loss, unilateral, right ear, with unrestricted hearing on the contralateral side: Secondary | ICD-10-CM | POA: Insufficient documentation

## 2016-03-10 DIAGNOSIS — H90A32 Mixed conductive and sensorineural hearing loss, unilateral, left ear with restricted hearing on the contralateral side: Secondary | ICD-10-CM | POA: Diagnosis not present

## 2016-03-10 DIAGNOSIS — H90A31 Mixed conductive and sensorineural hearing loss, unilateral, right ear with restricted hearing on the contralateral side: Secondary | ICD-10-CM | POA: Diagnosis not present

## 2016-03-13 DIAGNOSIS — Z45018 Encounter for adjustment and management of other part of cardiac pacemaker: Secondary | ICD-10-CM | POA: Diagnosis not present

## 2016-03-13 DIAGNOSIS — M25512 Pain in left shoulder: Secondary | ICD-10-CM | POA: Diagnosis not present

## 2016-03-13 DIAGNOSIS — S42295D Other nondisplaced fracture of upper end of left humerus, subsequent encounter for fracture with routine healing: Secondary | ICD-10-CM | POA: Diagnosis not present

## 2016-03-13 DIAGNOSIS — M25612 Stiffness of left shoulder, not elsewhere classified: Secondary | ICD-10-CM | POA: Diagnosis not present

## 2016-03-13 DIAGNOSIS — Z79899 Other long term (current) drug therapy: Secondary | ICD-10-CM | POA: Diagnosis not present

## 2016-03-13 DIAGNOSIS — M6281 Muscle weakness (generalized): Secondary | ICD-10-CM | POA: Diagnosis not present

## 2016-03-13 DIAGNOSIS — Z7982 Long term (current) use of aspirin: Secondary | ICD-10-CM | POA: Diagnosis not present

## 2016-03-22 DIAGNOSIS — M810 Age-related osteoporosis without current pathological fracture: Secondary | ICD-10-CM | POA: Diagnosis not present

## 2016-03-22 DIAGNOSIS — E559 Vitamin D deficiency, unspecified: Secondary | ICD-10-CM | POA: Diagnosis not present

## 2016-03-22 DIAGNOSIS — M25551 Pain in right hip: Secondary | ICD-10-CM | POA: Diagnosis not present

## 2016-03-22 DIAGNOSIS — M545 Low back pain: Secondary | ICD-10-CM | POA: Diagnosis not present

## 2016-04-12 DIAGNOSIS — Z79899 Other long term (current) drug therapy: Secondary | ICD-10-CM | POA: Diagnosis not present

## 2016-04-12 DIAGNOSIS — Z4821 Encounter for aftercare following heart transplant: Secondary | ICD-10-CM | POA: Diagnosis not present

## 2016-04-14 DIAGNOSIS — M810 Age-related osteoporosis without current pathological fracture: Secondary | ICD-10-CM | POA: Diagnosis not present

## 2016-04-14 DIAGNOSIS — E559 Vitamin D deficiency, unspecified: Secondary | ICD-10-CM | POA: Diagnosis not present

## 2016-05-04 DIAGNOSIS — R05 Cough: Secondary | ICD-10-CM | POA: Diagnosis not present

## 2016-05-04 DIAGNOSIS — J019 Acute sinusitis, unspecified: Secondary | ICD-10-CM | POA: Diagnosis not present

## 2016-05-15 DIAGNOSIS — J019 Acute sinusitis, unspecified: Secondary | ICD-10-CM | POA: Diagnosis not present

## 2016-05-15 DIAGNOSIS — Z941 Heart transplant status: Secondary | ICD-10-CM | POA: Diagnosis not present

## 2016-05-15 DIAGNOSIS — R8271 Bacteriuria: Secondary | ICD-10-CM | POA: Diagnosis not present

## 2016-05-15 DIAGNOSIS — Z87440 Personal history of urinary (tract) infections: Secondary | ICD-10-CM | POA: Diagnosis not present

## 2016-05-15 DIAGNOSIS — Z48298 Encounter for aftercare following other organ transplant: Secondary | ICD-10-CM | POA: Diagnosis not present

## 2016-05-15 DIAGNOSIS — J329 Chronic sinusitis, unspecified: Secondary | ICD-10-CM | POA: Diagnosis not present

## 2016-05-30 DIAGNOSIS — D8989 Other specified disorders involving the immune mechanism, not elsewhere classified: Secondary | ICD-10-CM | POA: Diagnosis not present

## 2016-05-30 DIAGNOSIS — L308 Other specified dermatitis: Secondary | ICD-10-CM | POA: Diagnosis not present

## 2016-05-30 DIAGNOSIS — D899 Disorder involving the immune mechanism, unspecified: Secondary | ICD-10-CM | POA: Diagnosis not present

## 2016-05-30 DIAGNOSIS — L570 Actinic keratosis: Secondary | ICD-10-CM | POA: Diagnosis not present

## 2016-05-30 DIAGNOSIS — Z85828 Personal history of other malignant neoplasm of skin: Secondary | ICD-10-CM | POA: Diagnosis not present

## 2016-05-30 DIAGNOSIS — Z48298 Encounter for aftercare following other organ transplant: Secondary | ICD-10-CM | POA: Diagnosis not present

## 2016-05-30 DIAGNOSIS — Z941 Heart transplant status: Secondary | ICD-10-CM | POA: Diagnosis not present

## 2016-05-30 DIAGNOSIS — I781 Nevus, non-neoplastic: Secondary | ICD-10-CM | POA: Diagnosis not present

## 2016-05-30 DIAGNOSIS — C44712 Basal cell carcinoma of skin of right lower limb, including hip: Secondary | ICD-10-CM | POA: Diagnosis not present

## 2016-05-30 DIAGNOSIS — L309 Dermatitis, unspecified: Secondary | ICD-10-CM | POA: Diagnosis not present

## 2016-05-30 DIAGNOSIS — Z4821 Encounter for aftercare following heart transplant: Secondary | ICD-10-CM | POA: Diagnosis not present

## 2016-05-30 DIAGNOSIS — I5041 Acute combined systolic (congestive) and diastolic (congestive) heart failure: Secondary | ICD-10-CM | POA: Diagnosis not present

## 2016-05-30 DIAGNOSIS — I071 Rheumatic tricuspid insufficiency: Secondary | ICD-10-CM | POA: Diagnosis not present

## 2016-06-21 DIAGNOSIS — Z23 Encounter for immunization: Secondary | ICD-10-CM | POA: Diagnosis not present

## 2016-07-07 DIAGNOSIS — K130 Diseases of lips: Secondary | ICD-10-CM | POA: Diagnosis not present

## 2016-07-11 DIAGNOSIS — M1712 Unilateral primary osteoarthritis, left knee: Secondary | ICD-10-CM | POA: Diagnosis not present

## 2016-07-11 DIAGNOSIS — Z96651 Presence of right artificial knee joint: Secondary | ICD-10-CM | POA: Diagnosis not present

## 2016-07-25 DIAGNOSIS — K130 Diseases of lips: Secondary | ICD-10-CM | POA: Diagnosis not present

## 2016-07-25 DIAGNOSIS — D899 Disorder involving the immune mechanism, unspecified: Secondary | ICD-10-CM | POA: Diagnosis not present

## 2016-07-25 DIAGNOSIS — Z872 Personal history of diseases of the skin and subcutaneous tissue: Secondary | ICD-10-CM | POA: Diagnosis not present

## 2016-07-27 DIAGNOSIS — Z941 Heart transplant status: Secondary | ICD-10-CM | POA: Diagnosis not present

## 2016-07-27 DIAGNOSIS — Z79899 Other long term (current) drug therapy: Secondary | ICD-10-CM | POA: Diagnosis not present

## 2016-07-27 DIAGNOSIS — Z87440 Personal history of urinary (tract) infections: Secondary | ICD-10-CM | POA: Diagnosis not present

## 2016-08-03 DIAGNOSIS — N200 Calculus of kidney: Secondary | ICD-10-CM | POA: Diagnosis not present

## 2016-08-03 DIAGNOSIS — B369 Superficial mycosis, unspecified: Secondary | ICD-10-CM | POA: Diagnosis not present

## 2016-08-03 DIAGNOSIS — N390 Urinary tract infection, site not specified: Secondary | ICD-10-CM | POA: Diagnosis not present

## 2016-08-03 DIAGNOSIS — Z941 Heart transplant status: Secondary | ICD-10-CM | POA: Diagnosis not present

## 2016-08-14 DIAGNOSIS — N390 Urinary tract infection, site not specified: Secondary | ICD-10-CM | POA: Diagnosis not present

## 2016-08-14 DIAGNOSIS — Z941 Heart transplant status: Secondary | ICD-10-CM | POA: Diagnosis not present

## 2016-08-14 DIAGNOSIS — N200 Calculus of kidney: Secondary | ICD-10-CM | POA: Diagnosis not present

## 2016-08-14 DIAGNOSIS — Z96641 Presence of right artificial hip joint: Secondary | ICD-10-CM | POA: Diagnosis not present

## 2016-08-14 DIAGNOSIS — Z87440 Personal history of urinary (tract) infections: Secondary | ICD-10-CM | POA: Diagnosis not present

## 2016-08-15 DIAGNOSIS — M1712 Unilateral primary osteoarthritis, left knee: Secondary | ICD-10-CM | POA: Diagnosis not present

## 2016-08-24 DIAGNOSIS — D849 Immunodeficiency, unspecified: Secondary | ICD-10-CM | POA: Diagnosis not present

## 2016-08-24 DIAGNOSIS — N390 Urinary tract infection, site not specified: Secondary | ICD-10-CM | POA: Diagnosis not present

## 2016-08-24 DIAGNOSIS — Z941 Heart transplant status: Secondary | ICD-10-CM | POA: Diagnosis not present

## 2016-08-24 DIAGNOSIS — B369 Superficial mycosis, unspecified: Secondary | ICD-10-CM | POA: Diagnosis not present

## 2016-08-29 DIAGNOSIS — D2270 Melanocytic nevi of unspecified lower limb, including hip: Secondary | ICD-10-CM | POA: Diagnosis not present

## 2016-08-29 DIAGNOSIS — L570 Actinic keratosis: Secondary | ICD-10-CM | POA: Diagnosis not present

## 2016-08-29 DIAGNOSIS — D1801 Hemangioma of skin and subcutaneous tissue: Secondary | ICD-10-CM | POA: Diagnosis not present

## 2016-08-29 DIAGNOSIS — L565 Disseminated superficial actinic porokeratosis (DSAP): Secondary | ICD-10-CM | POA: Diagnosis not present

## 2016-08-29 DIAGNOSIS — L918 Other hypertrophic disorders of the skin: Secondary | ICD-10-CM | POA: Diagnosis not present

## 2016-08-29 DIAGNOSIS — L814 Other melanin hyperpigmentation: Secondary | ICD-10-CM | POA: Diagnosis not present

## 2016-08-29 DIAGNOSIS — Z85828 Personal history of other malignant neoplasm of skin: Secondary | ICD-10-CM | POA: Diagnosis not present

## 2016-08-29 DIAGNOSIS — K130 Diseases of lips: Secondary | ICD-10-CM | POA: Diagnosis not present

## 2016-08-29 DIAGNOSIS — D2260 Melanocytic nevi of unspecified upper limb, including shoulder: Secondary | ICD-10-CM | POA: Diagnosis not present

## 2016-08-29 DIAGNOSIS — D225 Melanocytic nevi of trunk: Secondary | ICD-10-CM | POA: Diagnosis not present

## 2016-08-29 DIAGNOSIS — D8989 Other specified disorders involving the immune mechanism, not elsewhere classified: Secondary | ICD-10-CM | POA: Diagnosis not present

## 2016-08-29 DIAGNOSIS — L821 Other seborrheic keratosis: Secondary | ICD-10-CM | POA: Diagnosis not present

## 2016-08-29 DIAGNOSIS — L0889 Other specified local infections of the skin and subcutaneous tissue: Secondary | ICD-10-CM | POA: Diagnosis not present

## 2016-09-07 DIAGNOSIS — N390 Urinary tract infection, site not specified: Secondary | ICD-10-CM | POA: Diagnosis not present

## 2016-09-07 DIAGNOSIS — N952 Postmenopausal atrophic vaginitis: Secondary | ICD-10-CM | POA: Diagnosis not present

## 2016-09-07 DIAGNOSIS — Z87440 Personal history of urinary (tract) infections: Secondary | ICD-10-CM | POA: Diagnosis not present

## 2016-09-07 DIAGNOSIS — Z1389 Encounter for screening for other disorder: Secondary | ICD-10-CM | POA: Diagnosis not present

## 2016-09-11 DIAGNOSIS — Z45018 Encounter for adjustment and management of other part of cardiac pacemaker: Secondary | ICD-10-CM | POA: Diagnosis not present

## 2016-09-11 DIAGNOSIS — Z8679 Personal history of other diseases of the circulatory system: Secondary | ICD-10-CM | POA: Diagnosis not present

## 2016-09-20 DIAGNOSIS — M47816 Spondylosis without myelopathy or radiculopathy, lumbar region: Secondary | ICD-10-CM | POA: Diagnosis not present

## 2016-09-26 DIAGNOSIS — Z941 Heart transplant status: Secondary | ICD-10-CM | POA: Diagnosis not present

## 2016-09-26 DIAGNOSIS — N390 Urinary tract infection, site not specified: Secondary | ICD-10-CM | POA: Diagnosis not present

## 2016-09-26 DIAGNOSIS — Z79899 Other long term (current) drug therapy: Secondary | ICD-10-CM | POA: Diagnosis not present

## 2016-10-06 DIAGNOSIS — M4316 Spondylolisthesis, lumbar region: Secondary | ICD-10-CM | POA: Diagnosis not present

## 2016-10-06 DIAGNOSIS — M5136 Other intervertebral disc degeneration, lumbar region: Secondary | ICD-10-CM | POA: Diagnosis not present

## 2016-10-06 DIAGNOSIS — M5416 Radiculopathy, lumbar region: Secondary | ICD-10-CM | POA: Diagnosis not present

## 2016-10-06 DIAGNOSIS — M48061 Spinal stenosis, lumbar region without neurogenic claudication: Secondary | ICD-10-CM | POA: Diagnosis not present

## 2016-10-06 DIAGNOSIS — M4726 Other spondylosis with radiculopathy, lumbar region: Secondary | ICD-10-CM | POA: Diagnosis not present

## 2016-10-18 DIAGNOSIS — E559 Vitamin D deficiency, unspecified: Secondary | ICD-10-CM | POA: Diagnosis not present

## 2016-10-18 DIAGNOSIS — M810 Age-related osteoporosis without current pathological fracture: Secondary | ICD-10-CM | POA: Diagnosis not present

## 2016-11-28 DIAGNOSIS — D899 Disorder involving the immune mechanism, unspecified: Secondary | ICD-10-CM | POA: Diagnosis not present

## 2016-11-28 DIAGNOSIS — Z48298 Encounter for aftercare following other organ transplant: Secondary | ICD-10-CM | POA: Diagnosis not present

## 2016-11-28 DIAGNOSIS — Z941 Heart transplant status: Secondary | ICD-10-CM | POA: Diagnosis not present

## 2016-11-28 DIAGNOSIS — Z79899 Other long term (current) drug therapy: Secondary | ICD-10-CM | POA: Diagnosis not present

## 2016-11-28 DIAGNOSIS — I340 Nonrheumatic mitral (valve) insufficiency: Secondary | ICD-10-CM | POA: Diagnosis not present

## 2016-11-28 DIAGNOSIS — Z4821 Encounter for aftercare following heart transplant: Secondary | ICD-10-CM | POA: Diagnosis not present

## 2016-12-25 DIAGNOSIS — Z45018 Encounter for adjustment and management of other part of cardiac pacemaker: Secondary | ICD-10-CM | POA: Diagnosis not present

## 2016-12-25 DIAGNOSIS — Z950 Presence of cardiac pacemaker: Secondary | ICD-10-CM | POA: Diagnosis not present

## 2016-12-28 DIAGNOSIS — D2270 Melanocytic nevi of unspecified lower limb, including hip: Secondary | ICD-10-CM | POA: Diagnosis not present

## 2016-12-28 DIAGNOSIS — D899 Disorder involving the immune mechanism, unspecified: Secondary | ICD-10-CM | POA: Diagnosis not present

## 2016-12-28 DIAGNOSIS — L570 Actinic keratosis: Secondary | ICD-10-CM | POA: Diagnosis not present

## 2016-12-28 DIAGNOSIS — L814 Other melanin hyperpigmentation: Secondary | ICD-10-CM | POA: Diagnosis not present

## 2016-12-28 DIAGNOSIS — I781 Nevus, non-neoplastic: Secondary | ICD-10-CM | POA: Diagnosis not present

## 2016-12-28 DIAGNOSIS — L565 Disseminated superficial actinic porokeratosis (DSAP): Secondary | ICD-10-CM | POA: Diagnosis not present

## 2016-12-28 DIAGNOSIS — D225 Melanocytic nevi of trunk: Secondary | ICD-10-CM | POA: Diagnosis not present

## 2016-12-28 DIAGNOSIS — I8393 Asymptomatic varicose veins of bilateral lower extremities: Secondary | ICD-10-CM | POA: Diagnosis not present

## 2016-12-28 DIAGNOSIS — L821 Other seborrheic keratosis: Secondary | ICD-10-CM | POA: Diagnosis not present

## 2016-12-28 DIAGNOSIS — D1800 Hemangioma unspecified site: Secondary | ICD-10-CM | POA: Diagnosis not present

## 2016-12-28 DIAGNOSIS — Z85828 Personal history of other malignant neoplasm of skin: Secondary | ICD-10-CM | POA: Diagnosis not present

## 2016-12-28 DIAGNOSIS — K130 Diseases of lips: Secondary | ICD-10-CM | POA: Diagnosis not present

## 2017-01-16 DIAGNOSIS — M25562 Pain in left knee: Secondary | ICD-10-CM | POA: Diagnosis not present

## 2017-01-16 DIAGNOSIS — M25511 Pain in right shoulder: Secondary | ICD-10-CM | POA: Diagnosis not present

## 2017-01-23 DIAGNOSIS — M25572 Pain in left ankle and joints of left foot: Secondary | ICD-10-CM | POA: Diagnosis not present

## 2017-01-27 DIAGNOSIS — Z941 Heart transplant status: Secondary | ICD-10-CM | POA: Diagnosis not present

## 2017-01-27 DIAGNOSIS — Z79899 Other long term (current) drug therapy: Secondary | ICD-10-CM | POA: Diagnosis not present

## 2017-01-27 DIAGNOSIS — Z48298 Encounter for aftercare following other organ transplant: Secondary | ICD-10-CM | POA: Diagnosis not present

## 2017-02-16 DIAGNOSIS — Z48298 Encounter for aftercare following other organ transplant: Secondary | ICD-10-CM | POA: Diagnosis not present

## 2017-02-16 DIAGNOSIS — Z79899 Other long term (current) drug therapy: Secondary | ICD-10-CM | POA: Diagnosis not present

## 2017-02-16 DIAGNOSIS — Z941 Heart transplant status: Secondary | ICD-10-CM | POA: Diagnosis not present

## 2017-02-16 DIAGNOSIS — L659 Nonscarring hair loss, unspecified: Secondary | ICD-10-CM | POA: Diagnosis not present

## 2017-02-20 DIAGNOSIS — M25572 Pain in left ankle and joints of left foot: Secondary | ICD-10-CM | POA: Diagnosis not present

## 2017-03-20 DIAGNOSIS — S93492A Sprain of other ligament of left ankle, initial encounter: Secondary | ICD-10-CM | POA: Diagnosis not present

## 2017-04-10 DIAGNOSIS — M810 Age-related osteoporosis without current pathological fracture: Secondary | ICD-10-CM | POA: Diagnosis not present

## 2017-04-10 DIAGNOSIS — E559 Vitamin D deficiency, unspecified: Secondary | ICD-10-CM | POA: Diagnosis not present

## 2017-04-10 DIAGNOSIS — R5383 Other fatigue: Secondary | ICD-10-CM | POA: Diagnosis not present

## 2017-04-18 DIAGNOSIS — M810 Age-related osteoporosis without current pathological fracture: Secondary | ICD-10-CM | POA: Diagnosis not present

## 2017-04-18 DIAGNOSIS — E559 Vitamin D deficiency, unspecified: Secondary | ICD-10-CM | POA: Diagnosis not present

## 2017-04-21 DIAGNOSIS — R300 Dysuria: Secondary | ICD-10-CM | POA: Diagnosis not present

## 2017-04-21 DIAGNOSIS — Z941 Heart transplant status: Secondary | ICD-10-CM | POA: Diagnosis not present

## 2017-05-03 DIAGNOSIS — L565 Disseminated superficial actinic porokeratosis (DSAP): Secondary | ICD-10-CM | POA: Diagnosis not present

## 2017-05-03 DIAGNOSIS — Z85828 Personal history of other malignant neoplasm of skin: Secondary | ICD-10-CM | POA: Diagnosis not present

## 2017-05-03 DIAGNOSIS — D1800 Hemangioma unspecified site: Secondary | ICD-10-CM | POA: Diagnosis not present

## 2017-05-03 DIAGNOSIS — D899 Disorder involving the immune mechanism, unspecified: Secondary | ICD-10-CM | POA: Diagnosis not present

## 2017-05-03 DIAGNOSIS — D2260 Melanocytic nevi of unspecified upper limb, including shoulder: Secondary | ICD-10-CM | POA: Diagnosis not present

## 2017-05-03 DIAGNOSIS — L821 Other seborrheic keratosis: Secondary | ICD-10-CM | POA: Diagnosis not present

## 2017-05-03 DIAGNOSIS — D225 Melanocytic nevi of trunk: Secondary | ICD-10-CM | POA: Diagnosis not present

## 2017-05-03 DIAGNOSIS — D2270 Melanocytic nevi of unspecified lower limb, including hip: Secondary | ICD-10-CM | POA: Diagnosis not present

## 2017-05-03 DIAGNOSIS — L814 Other melanin hyperpigmentation: Secondary | ICD-10-CM | POA: Diagnosis not present

## 2017-06-05 DIAGNOSIS — Z941 Heart transplant status: Secondary | ICD-10-CM | POA: Diagnosis not present

## 2017-06-05 DIAGNOSIS — Z48298 Encounter for aftercare following other organ transplant: Secondary | ICD-10-CM | POA: Diagnosis not present

## 2017-06-05 DIAGNOSIS — R8271 Bacteriuria: Secondary | ICD-10-CM | POA: Diagnosis not present

## 2017-06-05 DIAGNOSIS — Z79899 Other long term (current) drug therapy: Secondary | ICD-10-CM | POA: Diagnosis not present

## 2017-06-06 DIAGNOSIS — Z48298 Encounter for aftercare following other organ transplant: Secondary | ICD-10-CM | POA: Diagnosis not present

## 2017-06-06 DIAGNOSIS — Z941 Heart transplant status: Secondary | ICD-10-CM | POA: Diagnosis not present

## 2017-06-06 DIAGNOSIS — I10 Essential (primary) hypertension: Secondary | ICD-10-CM | POA: Diagnosis not present

## 2017-06-06 DIAGNOSIS — Z79899 Other long term (current) drug therapy: Secondary | ICD-10-CM | POA: Diagnosis not present

## 2017-06-06 DIAGNOSIS — Z4821 Encounter for aftercare following heart transplant: Secondary | ICD-10-CM | POA: Diagnosis not present

## 2017-06-06 DIAGNOSIS — E785 Hyperlipidemia, unspecified: Secondary | ICD-10-CM | POA: Diagnosis not present

## 2017-06-06 DIAGNOSIS — I081 Rheumatic disorders of both mitral and tricuspid valves: Secondary | ICD-10-CM | POA: Diagnosis not present

## 2017-06-14 DIAGNOSIS — Z8572 Personal history of non-Hodgkin lymphomas: Secondary | ICD-10-CM | POA: Diagnosis not present

## 2017-06-14 DIAGNOSIS — Z941 Heart transplant status: Secondary | ICD-10-CM | POA: Diagnosis not present

## 2017-06-14 DIAGNOSIS — Z23 Encounter for immunization: Secondary | ICD-10-CM | POA: Diagnosis not present

## 2017-06-14 DIAGNOSIS — N390 Urinary tract infection, site not specified: Secondary | ICD-10-CM | POA: Diagnosis not present

## 2017-06-14 DIAGNOSIS — M1990 Unspecified osteoarthritis, unspecified site: Secondary | ICD-10-CM | POA: Diagnosis not present

## 2017-06-14 DIAGNOSIS — D849 Immunodeficiency, unspecified: Secondary | ICD-10-CM | POA: Diagnosis not present

## 2017-06-14 DIAGNOSIS — Z85828 Personal history of other malignant neoplasm of skin: Secondary | ICD-10-CM | POA: Diagnosis not present

## 2017-06-26 DIAGNOSIS — M25561 Pain in right knee: Secondary | ICD-10-CM | POA: Diagnosis not present

## 2017-07-04 DIAGNOSIS — M25531 Pain in right wrist: Secondary | ICD-10-CM | POA: Diagnosis not present

## 2017-07-04 DIAGNOSIS — M79641 Pain in right hand: Secondary | ICD-10-CM | POA: Diagnosis not present

## 2017-07-06 DIAGNOSIS — M25531 Pain in right wrist: Secondary | ICD-10-CM | POA: Diagnosis not present

## 2017-07-18 DIAGNOSIS — M25531 Pain in right wrist: Secondary | ICD-10-CM | POA: Diagnosis not present

## 2017-07-18 DIAGNOSIS — M79641 Pain in right hand: Secondary | ICD-10-CM | POA: Diagnosis not present

## 2017-08-14 DIAGNOSIS — R110 Nausea: Secondary | ICD-10-CM | POA: Diagnosis not present

## 2017-08-14 DIAGNOSIS — R933 Abnormal findings on diagnostic imaging of other parts of digestive tract: Secondary | ICD-10-CM | POA: Diagnosis not present

## 2017-08-14 DIAGNOSIS — R194 Change in bowel habit: Secondary | ICD-10-CM | POA: Diagnosis not present

## 2017-08-14 DIAGNOSIS — K8020 Calculus of gallbladder without cholecystitis without obstruction: Secondary | ICD-10-CM | POA: Diagnosis not present

## 2017-08-28 DIAGNOSIS — D1800 Hemangioma unspecified site: Secondary | ICD-10-CM | POA: Diagnosis not present

## 2017-08-28 DIAGNOSIS — L565 Disseminated superficial actinic porokeratosis (DSAP): Secondary | ICD-10-CM | POA: Diagnosis not present

## 2017-08-28 DIAGNOSIS — D225 Melanocytic nevi of trunk: Secondary | ICD-10-CM | POA: Diagnosis not present

## 2017-08-28 DIAGNOSIS — D2270 Melanocytic nevi of unspecified lower limb, including hip: Secondary | ICD-10-CM | POA: Diagnosis not present

## 2017-08-28 DIAGNOSIS — Z85828 Personal history of other malignant neoplasm of skin: Secondary | ICD-10-CM | POA: Diagnosis not present

## 2017-08-28 DIAGNOSIS — K130 Diseases of lips: Secondary | ICD-10-CM | POA: Diagnosis not present

## 2017-08-28 DIAGNOSIS — D899 Disorder involving the immune mechanism, unspecified: Secondary | ICD-10-CM | POA: Diagnosis not present

## 2017-08-28 DIAGNOSIS — B351 Tinea unguium: Secondary | ICD-10-CM | POA: Diagnosis not present

## 2017-08-28 DIAGNOSIS — D2260 Melanocytic nevi of unspecified upper limb, including shoulder: Secondary | ICD-10-CM | POA: Diagnosis not present

## 2017-08-28 DIAGNOSIS — L814 Other melanin hyperpigmentation: Secondary | ICD-10-CM | POA: Diagnosis not present

## 2017-08-28 DIAGNOSIS — L821 Other seborrheic keratosis: Secondary | ICD-10-CM | POA: Diagnosis not present

## 2017-10-02 DIAGNOSIS — Z45018 Encounter for adjustment and management of other part of cardiac pacemaker: Secondary | ICD-10-CM | POA: Diagnosis not present

## 2017-10-02 DIAGNOSIS — Z950 Presence of cardiac pacemaker: Secondary | ICD-10-CM | POA: Diagnosis not present

## 2017-10-02 DIAGNOSIS — I495 Sick sinus syndrome: Secondary | ICD-10-CM | POA: Diagnosis not present

## 2017-10-17 DIAGNOSIS — Z941 Heart transplant status: Secondary | ICD-10-CM | POA: Diagnosis not present

## 2017-10-17 DIAGNOSIS — N390 Urinary tract infection, site not specified: Secondary | ICD-10-CM | POA: Diagnosis not present

## 2017-10-17 DIAGNOSIS — D899 Disorder involving the immune mechanism, unspecified: Secondary | ICD-10-CM | POA: Diagnosis not present

## 2017-10-18 DIAGNOSIS — E559 Vitamin D deficiency, unspecified: Secondary | ICD-10-CM | POA: Diagnosis not present

## 2017-10-18 DIAGNOSIS — R5383 Other fatigue: Secondary | ICD-10-CM | POA: Diagnosis not present

## 2017-10-18 DIAGNOSIS — M810 Age-related osteoporosis without current pathological fracture: Secondary | ICD-10-CM | POA: Diagnosis not present

## 2017-10-29 DIAGNOSIS — N3000 Acute cystitis without hematuria: Secondary | ICD-10-CM | POA: Diagnosis not present

## 2017-11-14 DIAGNOSIS — M810 Age-related osteoporosis without current pathological fracture: Secondary | ICD-10-CM | POA: Diagnosis not present

## 2017-11-14 DIAGNOSIS — E559 Vitamin D deficiency, unspecified: Secondary | ICD-10-CM | POA: Diagnosis not present

## 2017-11-20 DIAGNOSIS — N3000 Acute cystitis without hematuria: Secondary | ICD-10-CM | POA: Diagnosis not present

## 2017-11-27 DIAGNOSIS — D849 Immunodeficiency, unspecified: Secondary | ICD-10-CM | POA: Diagnosis not present

## 2017-11-27 DIAGNOSIS — N22 Calculus of urinary tract in diseases classified elsewhere: Secondary | ICD-10-CM | POA: Diagnosis not present

## 2017-11-27 DIAGNOSIS — N390 Urinary tract infection, site not specified: Secondary | ICD-10-CM | POA: Diagnosis not present

## 2017-11-27 DIAGNOSIS — Z941 Heart transplant status: Secondary | ICD-10-CM | POA: Diagnosis not present

## 2017-12-11 ENCOUNTER — Ambulatory Visit: Payer: Medicare Other | Admitting: Podiatry

## 2017-12-18 DIAGNOSIS — N390 Urinary tract infection, site not specified: Secondary | ICD-10-CM | POA: Diagnosis not present

## 2017-12-18 DIAGNOSIS — N281 Cyst of kidney, acquired: Secondary | ICD-10-CM | POA: Diagnosis not present

## 2017-12-18 DIAGNOSIS — Z941 Heart transplant status: Secondary | ICD-10-CM | POA: Diagnosis not present

## 2017-12-18 DIAGNOSIS — Z48298 Encounter for aftercare following other organ transplant: Secondary | ICD-10-CM | POA: Diagnosis not present

## 2017-12-18 DIAGNOSIS — I517 Cardiomegaly: Secondary | ICD-10-CM | POA: Diagnosis not present

## 2017-12-18 DIAGNOSIS — R531 Weakness: Secondary | ICD-10-CM | POA: Diagnosis not present

## 2017-12-18 DIAGNOSIS — Z79899 Other long term (current) drug therapy: Secondary | ICD-10-CM | POA: Diagnosis not present

## 2017-12-18 DIAGNOSIS — R2689 Other abnormalities of gait and mobility: Secondary | ICD-10-CM | POA: Diagnosis not present

## 2017-12-18 DIAGNOSIS — Z96642 Presence of left artificial hip joint: Secondary | ICD-10-CM | POA: Diagnosis not present

## 2017-12-18 DIAGNOSIS — R42 Dizziness and giddiness: Secondary | ICD-10-CM | POA: Insufficient documentation

## 2017-12-18 DIAGNOSIS — Z87440 Personal history of urinary (tract) infections: Secondary | ICD-10-CM | POA: Diagnosis not present

## 2017-12-18 DIAGNOSIS — N22 Calculus of urinary tract in diseases classified elsewhere: Secondary | ICD-10-CM | POA: Diagnosis not present

## 2017-12-18 DIAGNOSIS — Z4821 Encounter for aftercare following heart transplant: Secondary | ICD-10-CM | POA: Diagnosis not present

## 2017-12-18 DIAGNOSIS — Z8639 Personal history of other endocrine, nutritional and metabolic disease: Secondary | ICD-10-CM | POA: Diagnosis not present

## 2017-12-20 ENCOUNTER — Ambulatory Visit (INDEPENDENT_AMBULATORY_CARE_PROVIDER_SITE_OTHER): Payer: Medicare Other | Admitting: Podiatry

## 2017-12-20 ENCOUNTER — Encounter: Payer: Self-pay | Admitting: Podiatry

## 2017-12-20 DIAGNOSIS — A499 Bacterial infection, unspecified: Secondary | ICD-10-CM | POA: Diagnosis not present

## 2017-12-20 DIAGNOSIS — L603 Nail dystrophy: Secondary | ICD-10-CM

## 2017-12-20 DIAGNOSIS — L608 Other nail disorders: Secondary | ICD-10-CM | POA: Diagnosis not present

## 2017-12-22 NOTE — Progress Notes (Signed)
Subjective:  Patient ID: Cassie Harvey, female    DOB: Jul 13, 1946,  MRN: [id number] HPI Chief Complaint  Patient presents with  . Nail Problem    Hallux nails bilateral - thick, discolored nails x years, has been using ciclopirox but hasn't helped  . New Patient (Initial Visit)    72 y.o. female presents with the above complaint.   ROS: All other systems are negative.  Past Medical History:  Diagnosis Date  . Broken shoulder    right  . Chronic pain   . DDD (degenerative disc disease), lumbar   . DJD (degenerative joint disease)   . Family history of anesthesia complication    My father suffered from a dysrythmia  . Heart transplanted ([place]) 2004  . 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 Hypertension   . IBS (irritable bowel syndrome)   . Immunosuppression ([place])   . Molar pregnancy    Tx; with chemo  . Mouth ulcers   . Neuropathy, peripheral   . Pacemaker 01/13/2003  . Pneumonia   . PONV (postoperative nausea and vomiting)   . Urinary frequency   . Wrist fracture, bilateral    Surgical repair left   Past Surgical History:  Procedure Laterality Date  . APPENDECTOMY    . CARDIAC CATHETERIZATION  2003   2015  . COLONOSCOPY    . COLONOSCOPY N/A 01/12/2014   Procedure: COLONOSCOPY;  Surgeon: Juanita Craver, MD;  Location: [facility];  Service: Endoscopy;  Laterality: N/A;  . 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 EAR EXAMINATION UNDER ANESTHESIA  1977   lt middle ear   . ELBOW ARTHROPLASTY     rt  . HEART TRANSPLANT  11/2002  . INJECTION KNEE Left 01/16/2013   Procedure: KNEE INJECTION;  Surgeon: [name], MD;  Location: [facility];  Service: Orthopedics;  Laterality: Left;  STEROID   . KNEE ARTHROSCOPY  1998   rt  . KNEE ARTHROSCOPY WITH MEDIAL MENISECTOMY Right 01/16/2013   Procedure: RIGHT KNEE ARTHROSCOPY WITH MEDIAL AND LATERAL MENISECTOMY, REMOVAL LOOSE FOREIGN BODY AND STEROID INJECTION;  Surgeon:  [name], MD;  Location: [facility];  Service: Orthopedics;  Laterality: Right;  DEPRIEDMENT LATERAL MENISCUS AND CHONDROPLASTY  . PACEMAKER INSERTION  4196,2229   new dual chamber -medtronic 5076-art/vent leads  . SEPTOPLASTY  1992  . SHOULDER ARTHROSCOPY  96,02   right abd left  . STERIOD INJECTION Left 01/16/2013   Procedure: STEROID INJECTION;  Surgeon: [name], MD;  Location: [facility];  Service: Orthopedics;  Laterality: Left;  . TOTAL HIP ARTHROPLASTY  02/2011   rt  . TOTAL KNEE ARTHROPLASTY Right 02/25/2014   Procedure: RIGHT TOTAL KNEE ARTHROPLASTY;  Surgeon: [name], MD;  Location: [facility];  Service: Orthopedics;  Laterality: Right;  . TUBAL LIGATION  1990  . WRIST ARTHROPLASTY  2008   left    Current Outpatient Medications:  .  aspirin EC 325 MG tablet, Take 1 tablet (325 mg total) by mouth daily., Disp: 30 tablet, Rfl: 0 .  bisacodyl (DULCOLAX) 5 MG EC tablet, Take 1 tablet (5 mg total) by mouth daily as needed for moderate constipation., Disp: 30 tablet, Rfl: 0 .  calcium-vitamin D (OSCAL WITH D) 500-200 MG-UNIT per tablet, Take 1 tablet by mouth 2 (two) times daily., Disp: , Rfl:  .  carvedilol (COREG) 3.125 MG tablet, Take 3.125 mg by mouth 2 (two) times daily  with a meal., Disp: , Rfl:  .  cefUROXime (CEFTIN) 500 MG tablet, , Disp: , Rfl: 0 .  cycloSPORINE modified (NEORAL) 100 MG capsule, Take 125 mg by mouth 2 (two) times daily. Takes along with 25 mg to equal 125 mg twice daily, Disp: , Rfl:  .  cycloSPORINE modified (NEORAL) 25 MG capsule, Take 125 mg by mouth 2 (two) times daily. Takes along with 100 mg to equal 125 twice daily, Disp: , Rfl:  .  docusate sodium (COLACE) 100 MG capsule, Take 100 mg by mouth 2 (two) times daily., Disp: , Rfl:  .  ezetimibe (ZETIA) 10 MG tablet, TK 1 T PO D UTD, Disp: , Rfl: 7 .  folic acid (FOLVITE) 1 MG tablet, , Disp: , Rfl: 10 .  gabapentin (NEURONTIN) 100 MG capsule, Take 100 mg by  mouth 3 (three) times daily., Disp: , Rfl:  .  loratadine (CLARITIN) 10 MG tablet, Take 10 mg by mouth daily as needed for allergies. , Disp: , Rfl:  .  methadone (DOLOPHINE) 5 MG tablet, Take 2.5-5 mg by mouth every 8 (eight) hours. 2.5 mg in am, 2.5 mg in afternoon and 5 mg at bedtime, Disp: , Rfl:  .  methocarbamol (ROBAXIN) 500 MG tablet, Take 1 tablet (500 mg total) by mouth 4 (four) times daily., Disp: 90 tablet, Rfl: 0 .  Multiple Vitamin (MULTIVITAMIN WITH MINERALS) TABS tablet, Take 1 tablet by mouth daily., Disp: , Rfl:  .  mupirocin ointment (BACTROBAN) 2 %, APP TOPICALLY TO LIPS BID, Disp: , Rfl: 5 .  mycophenolate (CELLCEPT) 500 MG tablet, Take by mouth 2 (two) times daily., Disp: , Rfl:  .  niacin (NIASPAN) 500 MG CR tablet, Take 500 mg by mouth at bedtime., Disp: , Rfl:  .  nystatin ointment (MYCOSTATIN), APP EXT AA BID, Disp: , Rfl: 2 .  ondansetron (ZOFRAN) 4 MG tablet, Take 1 tablet (4 mg total) by mouth every 8 (eight) hours as needed for nausea or vomiting., Disp: 40 tablet, Rfl: 0 .  ondansetron (ZOFRAN) 8 MG tablet, Take by mouth every 8 (eight) hours as needed for nausea., Disp: , Rfl:  .  oxyCODONE-acetaminophen (ROXICET) 5-325 MG per tablet, Take 1-2 tablets by mouth every 4 (four) hours as needed., Disp: 60 tablet, Rfl: 0 .  predniSONE (STERAPRED UNI-PAK) 10 MG tablet, Take by mouth daily., Disp: , Rfl:  .  solifenacin (VESICARE) 10 MG tablet, Take by mouth daily., Disp: , Rfl:  .  traMADol (ULTRAM) 50 MG tablet, TK 1 T PO Q 8 H PRN, Disp: , Rfl: 5 .  tretinoin (RETIN-A) 0.05 % cream, Apply 1 application topically at bedtime. , Disp: , Rfl:  .  valACYclovir (VALTREX) 500 MG tablet, Take 500 mg by mouth daily., Disp: , Rfl:   Allergies  Allergen Reactions  . Oxybutynin Shortness Of Breath  . Bacitracin Swelling  . Lisinopril Cough  . Neomycin Swelling  . Sulfa Antibiotics Rash  . Sulfabenzamide Rash   Review of Systems Objective:  There were no vitals filed for  this visit.  General: Well developed, nourished, in no acute distress, alert and oriented x3   Dermatological: Skin is warm, dry and supple bilateral. Nails x 10 are well maintained; remaining integument appears unremarkable at this time. There are no open sores, no preulcerative lesions, no rash or signs of infection present.  Vascular: Dorsalis Pedis artery and Posterior Tibial artery pedal pulses are 2/4 bilateral with immedate capillary fill time. Pedal hair growth  present. No varicosities and no lower extremity edema present bilateral.   Neruologic: Grossly intact via light touch bilateral. Vibratory intact via tuning fork bilateral. Protective threshold with Semmes Wienstein monofilament intact to all pedal sites bilateral. Patellar and Achilles deep tendon reflexes 2+ bilateral. No Babinski or clonus noted bilateral.   Musculoskeletal: No gross boney pedal deformities bilateral. No pain, crepitus, or limitation noted with foot and ankle range of motion bilateral. Muscular strength 5/5 in all groups tested bilateral.  Gait: Unassisted, Nonantalgic.    Radiographs:  None taken   Assessment & Plan:   Assessment: No dystrophy cannot rule out onychomycosis.  Plan: Samples of the skin and nail were taken today to be sent for pathologic evaluation.  We will follow-up with her once the results have arrived.     Cassie Harvey T. [place], [place]

## 2017-12-24 DIAGNOSIS — Z941 Heart transplant status: Secondary | ICD-10-CM | POA: Diagnosis not present

## 2017-12-24 DIAGNOSIS — D899 Disorder involving the immune mechanism, unspecified: Secondary | ICD-10-CM | POA: Diagnosis not present

## 2017-12-24 DIAGNOSIS — Z4821 Encounter for aftercare following heart transplant: Secondary | ICD-10-CM | POA: Diagnosis not present

## 2017-12-31 DIAGNOSIS — Z941 Heart transplant status: Secondary | ICD-10-CM | POA: Diagnosis not present

## 2018-01-03 DIAGNOSIS — D485 Neoplasm of uncertain behavior of skin: Secondary | ICD-10-CM | POA: Diagnosis not present

## 2018-01-03 DIAGNOSIS — D2270 Melanocytic nevi of unspecified lower limb, including hip: Secondary | ICD-10-CM | POA: Diagnosis not present

## 2018-01-03 DIAGNOSIS — K130 Diseases of lips: Secondary | ICD-10-CM | POA: Diagnosis not present

## 2018-01-03 DIAGNOSIS — D899 Disorder involving the immune mechanism, unspecified: Secondary | ICD-10-CM | POA: Diagnosis not present

## 2018-01-03 DIAGNOSIS — L308 Other specified dermatitis: Secondary | ICD-10-CM | POA: Diagnosis not present

## 2018-01-03 DIAGNOSIS — L570 Actinic keratosis: Secondary | ICD-10-CM | POA: Diagnosis not present

## 2018-01-03 DIAGNOSIS — Q828 Other specified congenital malformations of skin: Secondary | ICD-10-CM | POA: Diagnosis not present

## 2018-01-03 DIAGNOSIS — L814 Other melanin hyperpigmentation: Secondary | ICD-10-CM | POA: Diagnosis not present

## 2018-01-03 DIAGNOSIS — Z85828 Personal history of other malignant neoplasm of skin: Secondary | ICD-10-CM | POA: Diagnosis not present

## 2018-01-03 DIAGNOSIS — L578 Other skin changes due to chronic exposure to nonionizing radiation: Secondary | ICD-10-CM | POA: Diagnosis not present

## 2018-01-03 DIAGNOSIS — C44612 Basal cell carcinoma of skin of right upper limb, including shoulder: Secondary | ICD-10-CM | POA: Diagnosis not present

## 2018-01-03 DIAGNOSIS — B351 Tinea unguium: Secondary | ICD-10-CM | POA: Diagnosis not present

## 2018-01-03 DIAGNOSIS — L821 Other seborrheic keratosis: Secondary | ICD-10-CM | POA: Diagnosis not present

## 2018-01-03 DIAGNOSIS — D1801 Hemangioma of skin and subcutaneous tissue: Secondary | ICD-10-CM | POA: Diagnosis not present

## 2018-01-10 DIAGNOSIS — Z45018 Encounter for adjustment and management of other part of cardiac pacemaker: Secondary | ICD-10-CM | POA: Diagnosis not present

## 2018-01-10 DIAGNOSIS — Z950 Presence of cardiac pacemaker: Secondary | ICD-10-CM | POA: Diagnosis not present

## 2018-01-22 ENCOUNTER — Ambulatory Visit (INDEPENDENT_AMBULATORY_CARE_PROVIDER_SITE_OTHER): Payer: Medicare Other | Admitting: Podiatry

## 2018-01-22 DIAGNOSIS — M79676 Pain in unspecified toe(s): Secondary | ICD-10-CM | POA: Diagnosis not present

## 2018-01-22 DIAGNOSIS — B351 Tinea unguium: Secondary | ICD-10-CM

## 2018-01-22 NOTE — Progress Notes (Signed)
She presents today for follow-up of her pathology results regarding her onychomycosis.  Objective: Pathology report demonstrates a saprophytic fungus.  Assessment: Onychomycosis.  Plan: Due to her antirejection and transplant drugs she was checked with [facility] to see if they will allow her to do laser therapy of her nails.  To utilize orals would not be ethically sound and topicals should not cover this organism.  We will await her response to schedule her with Jan[facility].

## 2018-03-12 DIAGNOSIS — N390 Urinary tract infection, site not specified: Secondary | ICD-10-CM | POA: Diagnosis not present

## 2018-03-12 DIAGNOSIS — Z941 Heart transplant status: Secondary | ICD-10-CM | POA: Diagnosis not present

## 2018-03-12 DIAGNOSIS — Z4821 Encounter for aftercare following heart transplant: Secondary | ICD-10-CM | POA: Diagnosis not present

## 2018-03-12 DIAGNOSIS — D899 Disorder involving the immune mechanism, unspecified: Secondary | ICD-10-CM | POA: Diagnosis not present

## 2018-04-25 DIAGNOSIS — D899 Disorder involving the immune mechanism, unspecified: Secondary | ICD-10-CM | POA: Diagnosis not present

## 2018-04-25 DIAGNOSIS — L309 Dermatitis, unspecified: Secondary | ICD-10-CM | POA: Diagnosis not present

## 2018-04-25 DIAGNOSIS — L089 Local infection of the skin and subcutaneous tissue, unspecified: Secondary | ICD-10-CM | POA: Diagnosis not present

## 2018-04-25 DIAGNOSIS — B351 Tinea unguium: Secondary | ICD-10-CM | POA: Diagnosis not present

## 2018-04-25 DIAGNOSIS — Q828 Other specified congenital malformations of skin: Secondary | ICD-10-CM | POA: Diagnosis not present

## 2018-04-25 DIAGNOSIS — L308 Other specified dermatitis: Secondary | ICD-10-CM | POA: Diagnosis not present

## 2018-04-25 DIAGNOSIS — Z85828 Personal history of other malignant neoplasm of skin: Secondary | ICD-10-CM | POA: Diagnosis not present

## 2018-04-25 DIAGNOSIS — D229 Melanocytic nevi, unspecified: Secondary | ICD-10-CM | POA: Diagnosis not present

## 2018-04-25 DIAGNOSIS — L821 Other seborrheic keratosis: Secondary | ICD-10-CM | POA: Diagnosis not present

## 2018-04-29 ENCOUNTER — Ambulatory Visit: Payer: Medicare Other

## 2018-05-15 DIAGNOSIS — M810 Age-related osteoporosis without current pathological fracture: Secondary | ICD-10-CM | POA: Diagnosis not present

## 2018-05-15 DIAGNOSIS — E559 Vitamin D deficiency, unspecified: Secondary | ICD-10-CM | POA: Diagnosis not present

## 2018-05-31 DIAGNOSIS — M8589 Other specified disorders of bone density and structure, multiple sites: Secondary | ICD-10-CM | POA: Diagnosis not present

## 2018-06-03 ENCOUNTER — Ambulatory Visit (INDEPENDENT_AMBULATORY_CARE_PROVIDER_SITE_OTHER): Payer: Self-pay | Admitting: Podiatry

## 2018-06-03 DIAGNOSIS — L603 Nail dystrophy: Secondary | ICD-10-CM

## 2018-06-03 DIAGNOSIS — B351 Tinea unguium: Secondary | ICD-10-CM

## 2018-06-05 NOTE — Progress Notes (Signed)
Pt presents with mycotic infection of nails 1-5 bilateral.  All other systems are negative  Laser therapy administered to affected nails and tolerated well. All safety precautions were in place.  2nd treatment.  Follow up in 4 weeks     

## 2018-06-20 DIAGNOSIS — L821 Other seborrheic keratosis: Secondary | ICD-10-CM | POA: Diagnosis not present

## 2018-06-20 DIAGNOSIS — D1801 Hemangioma of skin and subcutaneous tissue: Secondary | ICD-10-CM | POA: Diagnosis not present

## 2018-06-20 DIAGNOSIS — Z85828 Personal history of other malignant neoplasm of skin: Secondary | ICD-10-CM | POA: Diagnosis not present

## 2018-06-20 DIAGNOSIS — L309 Dermatitis, unspecified: Secondary | ICD-10-CM | POA: Diagnosis not present

## 2018-06-20 DIAGNOSIS — Q828 Other specified congenital malformations of skin: Secondary | ICD-10-CM | POA: Diagnosis not present

## 2018-06-20 DIAGNOSIS — D225 Melanocytic nevi of trunk: Secondary | ICD-10-CM | POA: Diagnosis not present

## 2018-06-20 DIAGNOSIS — D2270 Melanocytic nevi of unspecified lower limb, including hip: Secondary | ICD-10-CM | POA: Diagnosis not present

## 2018-06-20 DIAGNOSIS — D2260 Melanocytic nevi of unspecified upper limb, including shoulder: Secondary | ICD-10-CM | POA: Diagnosis not present

## 2018-06-20 DIAGNOSIS — K130 Diseases of lips: Secondary | ICD-10-CM | POA: Diagnosis not present

## 2018-06-20 DIAGNOSIS — L814 Other melanin hyperpigmentation: Secondary | ICD-10-CM | POA: Diagnosis not present

## 2018-06-20 DIAGNOSIS — D899 Disorder involving the immune mechanism, unspecified: Secondary | ICD-10-CM | POA: Diagnosis not present

## 2018-07-02 DIAGNOSIS — G620 Drug-induced polyneuropathy: Secondary | ICD-10-CM | POA: Diagnosis not present

## 2018-07-02 DIAGNOSIS — Z941 Heart transplant status: Secondary | ICD-10-CM | POA: Diagnosis not present

## 2018-07-02 DIAGNOSIS — D899 Disorder involving the immune mechanism, unspecified: Secondary | ICD-10-CM | POA: Diagnosis not present

## 2018-07-02 DIAGNOSIS — Z4821 Encounter for aftercare following heart transplant: Secondary | ICD-10-CM | POA: Diagnosis not present

## 2018-07-02 DIAGNOSIS — R51 Headache: Secondary | ICD-10-CM | POA: Diagnosis not present

## 2018-07-02 DIAGNOSIS — Z23 Encounter for immunization: Secondary | ICD-10-CM | POA: Diagnosis not present

## 2018-07-02 DIAGNOSIS — Z950 Presence of cardiac pacemaker: Secondary | ICD-10-CM | POA: Diagnosis not present

## 2018-07-02 DIAGNOSIS — I495 Sick sinus syndrome: Secondary | ICD-10-CM | POA: Diagnosis not present

## 2018-07-02 DIAGNOSIS — Z79899 Other long term (current) drug therapy: Secondary | ICD-10-CM | POA: Diagnosis not present

## 2018-07-02 DIAGNOSIS — L309 Dermatitis, unspecified: Secondary | ICD-10-CM | POA: Diagnosis not present

## 2018-07-02 DIAGNOSIS — N390 Urinary tract infection, site not specified: Secondary | ICD-10-CM | POA: Diagnosis not present

## 2018-07-04 ENCOUNTER — Ambulatory Visit: Payer: Medicare Other

## 2018-07-04 DIAGNOSIS — B351 Tinea unguium: Secondary | ICD-10-CM

## 2018-07-04 DIAGNOSIS — L603 Nail dystrophy: Secondary | ICD-10-CM

## 2018-07-05 NOTE — Progress Notes (Signed)
Pt presents with mycotic infection of nails 1-5 bilateral.  All other systems are negative  Laser therapy administered to affected nails and tolerated well. All safety precautions were in place.  3rd treatment.  Follow up in 4 weeks     

## 2018-07-17 DIAGNOSIS — M25561 Pain in right knee: Secondary | ICD-10-CM | POA: Diagnosis not present

## 2018-07-17 DIAGNOSIS — M25562 Pain in left knee: Secondary | ICD-10-CM | POA: Diagnosis not present

## 2018-07-25 DIAGNOSIS — Z45018 Encounter for adjustment and management of other part of cardiac pacemaker: Secondary | ICD-10-CM | POA: Diagnosis not present

## 2018-07-25 DIAGNOSIS — Z950 Presence of cardiac pacemaker: Secondary | ICD-10-CM | POA: Diagnosis not present

## 2018-07-25 DIAGNOSIS — R001 Bradycardia, unspecified: Secondary | ICD-10-CM | POA: Diagnosis not present

## 2018-09-05 DIAGNOSIS — M542 Cervicalgia: Secondary | ICD-10-CM | POA: Diagnosis not present

## 2018-09-05 DIAGNOSIS — M47816 Spondylosis without myelopathy or radiculopathy, lumbar region: Secondary | ICD-10-CM | POA: Diagnosis not present

## 2018-09-24 DIAGNOSIS — R8290 Unspecified abnormal findings in urine: Secondary | ICD-10-CM | POA: Diagnosis not present

## 2018-09-24 DIAGNOSIS — Z941 Heart transplant status: Secondary | ICD-10-CM | POA: Diagnosis not present

## 2018-09-24 DIAGNOSIS — D899 Disorder involving the immune mechanism, unspecified: Secondary | ICD-10-CM | POA: Diagnosis not present

## 2018-09-24 DIAGNOSIS — N390 Urinary tract infection, site not specified: Secondary | ICD-10-CM | POA: Diagnosis not present

## 2018-10-01 DIAGNOSIS — D229 Melanocytic nevi, unspecified: Secondary | ICD-10-CM | POA: Diagnosis not present

## 2018-10-01 DIAGNOSIS — L821 Other seborrheic keratosis: Secondary | ICD-10-CM | POA: Diagnosis not present

## 2018-10-01 DIAGNOSIS — D899 Disorder involving the immune mechanism, unspecified: Secondary | ICD-10-CM | POA: Diagnosis not present

## 2018-10-01 DIAGNOSIS — Q828 Other specified congenital malformations of skin: Secondary | ICD-10-CM | POA: Diagnosis not present

## 2018-10-01 DIAGNOSIS — L570 Actinic keratosis: Secondary | ICD-10-CM | POA: Diagnosis not present

## 2018-10-01 DIAGNOSIS — D2270 Melanocytic nevi of unspecified lower limb, including hip: Secondary | ICD-10-CM | POA: Diagnosis not present

## 2018-10-01 DIAGNOSIS — L309 Dermatitis, unspecified: Secondary | ICD-10-CM | POA: Diagnosis not present

## 2018-10-01 DIAGNOSIS — C44729 Squamous cell carcinoma of skin of left lower limb, including hip: Secondary | ICD-10-CM | POA: Diagnosis not present

## 2018-10-01 DIAGNOSIS — Z7982 Long term (current) use of aspirin: Secondary | ICD-10-CM | POA: Diagnosis not present

## 2018-10-01 DIAGNOSIS — I781 Nevus, non-neoplastic: Secondary | ICD-10-CM | POA: Diagnosis not present

## 2018-10-01 DIAGNOSIS — Z85828 Personal history of other malignant neoplasm of skin: Secondary | ICD-10-CM | POA: Diagnosis not present

## 2018-10-01 DIAGNOSIS — Z79899 Other long term (current) drug therapy: Secondary | ICD-10-CM | POA: Diagnosis not present

## 2018-10-01 DIAGNOSIS — D485 Neoplasm of uncertain behavior of skin: Secondary | ICD-10-CM | POA: Diagnosis not present

## 2018-10-01 DIAGNOSIS — D2260 Melanocytic nevi of unspecified upper limb, including shoulder: Secondary | ICD-10-CM | POA: Diagnosis not present

## 2018-10-01 DIAGNOSIS — D225 Melanocytic nevi of trunk: Secondary | ICD-10-CM | POA: Diagnosis not present

## 2018-10-04 DIAGNOSIS — M5415 Radiculopathy, thoracolumbar region: Secondary | ICD-10-CM | POA: Diagnosis not present

## 2018-10-04 DIAGNOSIS — M5136 Other intervertebral disc degeneration, lumbar region: Secondary | ICD-10-CM | POA: Diagnosis not present

## 2018-10-04 DIAGNOSIS — M5416 Radiculopathy, lumbar region: Secondary | ICD-10-CM | POA: Diagnosis not present

## 2018-10-04 DIAGNOSIS — M4726 Other spondylosis with radiculopathy, lumbar region: Secondary | ICD-10-CM | POA: Diagnosis not present

## 2018-10-18 DIAGNOSIS — Z941 Heart transplant status: Secondary | ICD-10-CM | POA: Diagnosis not present

## 2018-10-18 DIAGNOSIS — N390 Urinary tract infection, site not specified: Secondary | ICD-10-CM | POA: Diagnosis not present

## 2018-11-05 DIAGNOSIS — D099 Carcinoma in situ, unspecified: Secondary | ICD-10-CM | POA: Diagnosis not present

## 2018-11-05 DIAGNOSIS — B370 Candidal stomatitis: Secondary | ICD-10-CM | POA: Diagnosis not present

## 2018-11-15 DIAGNOSIS — N390 Urinary tract infection, site not specified: Secondary | ICD-10-CM | POA: Diagnosis not present

## 2018-11-15 DIAGNOSIS — D899 Disorder involving the immune mechanism, unspecified: Secondary | ICD-10-CM | POA: Diagnosis not present

## 2018-11-15 DIAGNOSIS — Z4821 Encounter for aftercare following heart transplant: Secondary | ICD-10-CM | POA: Diagnosis not present

## 2018-11-18 DIAGNOSIS — N390 Urinary tract infection, site not specified: Secondary | ICD-10-CM | POA: Diagnosis not present

## 2018-11-18 DIAGNOSIS — Z4821 Encounter for aftercare following heart transplant: Secondary | ICD-10-CM | POA: Diagnosis not present

## 2018-11-18 DIAGNOSIS — D899 Disorder involving the immune mechanism, unspecified: Secondary | ICD-10-CM | POA: Diagnosis not present

## 2018-11-18 DIAGNOSIS — Z941 Heart transplant status: Secondary | ICD-10-CM | POA: Diagnosis not present

## 2018-11-20 DIAGNOSIS — N390 Urinary tract infection, site not specified: Secondary | ICD-10-CM | POA: Insufficient documentation

## 2018-11-28 DIAGNOSIS — N390 Urinary tract infection, site not specified: Secondary | ICD-10-CM | POA: Diagnosis not present

## 2018-11-28 DIAGNOSIS — Z4821 Encounter for aftercare following heart transplant: Secondary | ICD-10-CM | POA: Diagnosis not present

## 2018-11-28 DIAGNOSIS — R600 Localized edema: Secondary | ICD-10-CM | POA: Diagnosis not present

## 2018-11-28 DIAGNOSIS — Z941 Heart transplant status: Secondary | ICD-10-CM | POA: Diagnosis not present

## 2018-11-28 DIAGNOSIS — I081 Rheumatic disorders of both mitral and tricuspid valves: Secondary | ICD-10-CM | POA: Diagnosis not present

## 2018-11-28 DIAGNOSIS — Z79899 Other long term (current) drug therapy: Secondary | ICD-10-CM | POA: Diagnosis not present

## 2018-11-28 DIAGNOSIS — D899 Disorder involving the immune mechanism, unspecified: Secondary | ICD-10-CM | POA: Diagnosis not present

## 2018-11-28 DIAGNOSIS — R42 Dizziness and giddiness: Secondary | ICD-10-CM | POA: Diagnosis not present

## 2018-11-28 DIAGNOSIS — E7841 Elevated Lipoprotein(a): Secondary | ICD-10-CM | POA: Diagnosis not present

## 2018-11-28 DIAGNOSIS — I119 Hypertensive heart disease without heart failure: Secondary | ICD-10-CM | POA: Diagnosis not present

## 2018-11-28 DIAGNOSIS — R7989 Other specified abnormal findings of blood chemistry: Secondary | ICD-10-CM | POA: Diagnosis not present

## 2018-12-10 DIAGNOSIS — N390 Urinary tract infection, site not specified: Secondary | ICD-10-CM | POA: Diagnosis not present

## 2018-12-24 DIAGNOSIS — D0470 Carcinoma in situ of skin of unspecified lower limb, including hip: Secondary | ICD-10-CM | POA: Diagnosis not present

## 2019-02-13 DIAGNOSIS — D2270 Melanocytic nevi of unspecified lower limb, including hip: Secondary | ICD-10-CM | POA: Diagnosis not present

## 2019-02-13 DIAGNOSIS — D225 Melanocytic nevi of trunk: Secondary | ICD-10-CM | POA: Diagnosis not present

## 2019-02-13 DIAGNOSIS — D045 Carcinoma in situ of skin of trunk: Secondary | ICD-10-CM | POA: Diagnosis not present

## 2019-02-13 DIAGNOSIS — D485 Neoplasm of uncertain behavior of skin: Secondary | ICD-10-CM | POA: Diagnosis not present

## 2019-02-13 DIAGNOSIS — D1801 Hemangioma of skin and subcutaneous tissue: Secondary | ICD-10-CM | POA: Diagnosis not present

## 2019-02-13 DIAGNOSIS — D899 Disorder involving the immune mechanism, unspecified: Secondary | ICD-10-CM | POA: Diagnosis not present

## 2019-02-13 DIAGNOSIS — Q828 Other specified congenital malformations of skin: Secondary | ICD-10-CM | POA: Diagnosis not present

## 2019-02-13 DIAGNOSIS — L309 Dermatitis, unspecified: Secondary | ICD-10-CM | POA: Diagnosis not present

## 2019-02-13 DIAGNOSIS — D2260 Melanocytic nevi of unspecified upper limb, including shoulder: Secondary | ICD-10-CM | POA: Diagnosis not present

## 2019-02-13 DIAGNOSIS — L821 Other seborrheic keratosis: Secondary | ICD-10-CM | POA: Diagnosis not present

## 2019-02-13 DIAGNOSIS — Z85828 Personal history of other malignant neoplasm of skin: Secondary | ICD-10-CM | POA: Diagnosis not present

## 2019-03-25 DIAGNOSIS — Z950 Presence of cardiac pacemaker: Secondary | ICD-10-CM | POA: Diagnosis not present

## 2019-03-25 DIAGNOSIS — Z45018 Encounter for adjustment and management of other part of cardiac pacemaker: Secondary | ICD-10-CM | POA: Diagnosis not present

## 2019-03-25 DIAGNOSIS — I495 Sick sinus syndrome: Secondary | ICD-10-CM | POA: Diagnosis not present

## 2019-03-25 DIAGNOSIS — I472 Ventricular tachycardia: Secondary | ICD-10-CM | POA: Diagnosis not present

## 2019-03-25 DIAGNOSIS — Z941 Heart transplant status: Secondary | ICD-10-CM | POA: Diagnosis not present

## 2019-04-15 DIAGNOSIS — Z48298 Encounter for aftercare following other organ transplant: Secondary | ICD-10-CM | POA: Diagnosis not present

## 2019-04-15 DIAGNOSIS — Z941 Heart transplant status: Secondary | ICD-10-CM | POA: Diagnosis not present

## 2019-04-15 DIAGNOSIS — Z9225 Personal history of immunosupression therapy: Secondary | ICD-10-CM | POA: Diagnosis not present

## 2019-06-19 DIAGNOSIS — D225 Melanocytic nevi of trunk: Secondary | ICD-10-CM | POA: Diagnosis not present

## 2019-06-19 DIAGNOSIS — L309 Dermatitis, unspecified: Secondary | ICD-10-CM | POA: Diagnosis not present

## 2019-06-19 DIAGNOSIS — L650 Telogen effluvium: Secondary | ICD-10-CM | POA: Diagnosis not present

## 2019-06-19 DIAGNOSIS — Z79899 Other long term (current) drug therapy: Secondary | ICD-10-CM | POA: Diagnosis not present

## 2019-06-19 DIAGNOSIS — L570 Actinic keratosis: Secondary | ICD-10-CM | POA: Diagnosis not present

## 2019-06-19 DIAGNOSIS — L821 Other seborrheic keratosis: Secondary | ICD-10-CM | POA: Diagnosis not present

## 2019-06-19 DIAGNOSIS — D899 Disorder involving the immune mechanism, unspecified: Secondary | ICD-10-CM | POA: Diagnosis not present

## 2019-06-19 DIAGNOSIS — D1809 Hemangioma of other sites: Secondary | ICD-10-CM | POA: Diagnosis not present

## 2019-06-19 DIAGNOSIS — D2270 Melanocytic nevi of unspecified lower limb, including hip: Secondary | ICD-10-CM | POA: Diagnosis not present

## 2019-06-19 DIAGNOSIS — D485 Neoplasm of uncertain behavior of skin: Secondary | ICD-10-CM | POA: Diagnosis not present

## 2019-06-19 DIAGNOSIS — D045 Carcinoma in situ of skin of trunk: Secondary | ICD-10-CM | POA: Diagnosis not present

## 2019-06-19 DIAGNOSIS — L910 Hypertrophic scar: Secondary | ICD-10-CM | POA: Diagnosis not present

## 2019-06-19 DIAGNOSIS — D367 Benign neoplasm of other specified sites: Secondary | ICD-10-CM | POA: Diagnosis not present

## 2019-06-19 DIAGNOSIS — Z85828 Personal history of other malignant neoplasm of skin: Secondary | ICD-10-CM | POA: Diagnosis not present

## 2019-06-19 DIAGNOSIS — D1801 Hemangioma of skin and subcutaneous tissue: Secondary | ICD-10-CM | POA: Diagnosis not present

## 2019-06-19 DIAGNOSIS — L814 Other melanin hyperpigmentation: Secondary | ICD-10-CM | POA: Diagnosis not present

## 2019-07-08 DIAGNOSIS — Z79899 Other long term (current) drug therapy: Secondary | ICD-10-CM | POA: Diagnosis not present

## 2019-07-08 DIAGNOSIS — R2689 Other abnormalities of gait and mobility: Secondary | ICD-10-CM | POA: Diagnosis not present

## 2019-07-08 DIAGNOSIS — Z941 Heart transplant status: Secondary | ICD-10-CM | POA: Diagnosis not present

## 2019-07-08 DIAGNOSIS — I517 Cardiomegaly: Secondary | ICD-10-CM | POA: Diagnosis not present

## 2019-07-08 DIAGNOSIS — I340 Nonrheumatic mitral (valve) insufficiency: Secondary | ICD-10-CM | POA: Diagnosis not present

## 2019-07-08 DIAGNOSIS — R600 Localized edema: Secondary | ICD-10-CM | POA: Diagnosis not present

## 2019-07-08 DIAGNOSIS — Z23 Encounter for immunization: Secondary | ICD-10-CM | POA: Diagnosis not present

## 2019-07-08 DIAGNOSIS — D849 Immunodeficiency, unspecified: Secondary | ICD-10-CM | POA: Diagnosis not present

## 2019-07-08 DIAGNOSIS — Z4821 Encounter for aftercare following heart transplant: Secondary | ICD-10-CM | POA: Diagnosis not present

## 2019-07-08 DIAGNOSIS — N390 Urinary tract infection, site not specified: Secondary | ICD-10-CM | POA: Diagnosis not present

## 2019-10-10 DIAGNOSIS — Z950 Presence of cardiac pacemaker: Secondary | ICD-10-CM | POA: Diagnosis not present

## 2019-10-10 DIAGNOSIS — Z45018 Encounter for adjustment and management of other part of cardiac pacemaker: Secondary | ICD-10-CM | POA: Diagnosis not present

## 2019-10-28 DIAGNOSIS — D485 Neoplasm of uncertain behavior of skin: Secondary | ICD-10-CM | POA: Diagnosis not present

## 2019-10-28 DIAGNOSIS — D2270 Melanocytic nevi of unspecified lower limb, including hip: Secondary | ICD-10-CM | POA: Diagnosis not present

## 2019-10-28 DIAGNOSIS — D225 Melanocytic nevi of trunk: Secondary | ICD-10-CM | POA: Diagnosis not present

## 2019-10-28 DIAGNOSIS — L603 Nail dystrophy: Secondary | ICD-10-CM | POA: Diagnosis not present

## 2019-10-28 DIAGNOSIS — L570 Actinic keratosis: Secondary | ICD-10-CM | POA: Diagnosis not present

## 2019-10-28 DIAGNOSIS — Q828 Other specified congenital malformations of skin: Secondary | ICD-10-CM | POA: Diagnosis not present

## 2019-10-28 DIAGNOSIS — C4492 Squamous cell carcinoma of skin, unspecified: Secondary | ICD-10-CM | POA: Diagnosis not present

## 2019-10-28 DIAGNOSIS — L814 Other melanin hyperpigmentation: Secondary | ICD-10-CM | POA: Diagnosis not present

## 2019-10-28 DIAGNOSIS — D1801 Hemangioma of skin and subcutaneous tissue: Secondary | ICD-10-CM | POA: Diagnosis not present

## 2019-10-28 DIAGNOSIS — D849 Immunodeficiency, unspecified: Secondary | ICD-10-CM | POA: Diagnosis not present

## 2019-10-28 DIAGNOSIS — L650 Telogen effluvium: Secondary | ICD-10-CM | POA: Diagnosis not present

## 2019-10-28 DIAGNOSIS — Z85828 Personal history of other malignant neoplasm of skin: Secondary | ICD-10-CM | POA: Diagnosis not present

## 2019-11-12 DIAGNOSIS — Z23 Encounter for immunization: Secondary | ICD-10-CM | POA: Diagnosis not present

## 2019-12-03 DIAGNOSIS — Z23 Encounter for immunization: Secondary | ICD-10-CM | POA: Diagnosis not present

## 2019-12-25 DIAGNOSIS — L570 Actinic keratosis: Secondary | ICD-10-CM | POA: Diagnosis not present

## 2019-12-25 DIAGNOSIS — D0461 Carcinoma in situ of skin of right upper limb, including shoulder: Secondary | ICD-10-CM | POA: Diagnosis not present

## 2019-12-31 DIAGNOSIS — M810 Age-related osteoporosis without current pathological fracture: Secondary | ICD-10-CM | POA: Diagnosis not present

## 2019-12-31 DIAGNOSIS — E559 Vitamin D deficiency, unspecified: Secondary | ICD-10-CM | POA: Diagnosis not present

## 2019-12-31 DIAGNOSIS — R5383 Other fatigue: Secondary | ICD-10-CM | POA: Diagnosis not present

## 2020-01-06 ENCOUNTER — Other Ambulatory Visit: Payer: Self-pay

## 2020-01-06 ENCOUNTER — Ambulatory Visit (INDEPENDENT_AMBULATORY_CARE_PROVIDER_SITE_OTHER): Payer: Medicare Other | Admitting: Podiatry

## 2020-01-06 DIAGNOSIS — B351 Tinea unguium: Secondary | ICD-10-CM | POA: Diagnosis not present

## 2020-01-06 DIAGNOSIS — L603 Nail dystrophy: Secondary | ICD-10-CM

## 2020-01-06 DIAGNOSIS — M79676 Pain in unspecified toe(s): Secondary | ICD-10-CM

## 2020-01-06 NOTE — Progress Notes (Signed)
She presents today for follow-up of her onychomycosis of the hallux bilaterally.  She had laser therapy done and it was doing very well and she says it looks great afterwards and have now it looks bad again.  She is a heart transplant patient and cannot take any medication.  Objective: Vital signs are stable alert and oriented x3.  Pulses are palpable.  She does have onychomycosis to many of the nails particularly the hallux bilaterally left greater than right.  Assessment: Pain in limb secondary to onychomycosis.  Plan: Schedule her with Caryl Pina for laser therapy.  I also trimmed her nails 1 through 5 bilaterally.

## 2020-01-09 DIAGNOSIS — Z45018 Encounter for adjustment and management of other part of cardiac pacemaker: Secondary | ICD-10-CM | POA: Diagnosis not present

## 2020-01-09 DIAGNOSIS — Z950 Presence of cardiac pacemaker: Secondary | ICD-10-CM | POA: Diagnosis not present

## 2020-01-12 ENCOUNTER — Other Ambulatory Visit: Payer: PRIVATE HEALTH INSURANCE

## 2020-01-16 DIAGNOSIS — Z1231 Encounter for screening mammogram for malignant neoplasm of breast: Secondary | ICD-10-CM | POA: Diagnosis not present

## 2020-02-09 ENCOUNTER — Ambulatory Visit (INDEPENDENT_AMBULATORY_CARE_PROVIDER_SITE_OTHER): Payer: Medicare Other | Admitting: *Deleted

## 2020-02-09 ENCOUNTER — Other Ambulatory Visit: Payer: Self-pay

## 2020-02-09 DIAGNOSIS — L603 Nail dystrophy: Secondary | ICD-10-CM

## 2020-02-09 NOTE — Progress Notes (Signed)
Patient presents today for the 1st laser treatment. Diagnosed with mycotic nail infection by Dr. Milinda Pointer.   Toenail most affected hallux nail right. She has had laser treatment in the past as well.  All other systems are negative.  Nails were filed thin. Laser therapy was administered to 1st toenails right and patient tolerated the treatment well. All safety precautions were in place.    Follow up in 4 weeks for laser # 2.  Picture of nails taken today to document visual progress

## 2020-02-09 NOTE — Patient Instructions (Signed)

## 2020-02-24 DIAGNOSIS — L309 Dermatitis, unspecified: Secondary | ICD-10-CM | POA: Diagnosis not present

## 2020-02-24 DIAGNOSIS — D1801 Hemangioma of skin and subcutaneous tissue: Secondary | ICD-10-CM | POA: Diagnosis not present

## 2020-02-24 DIAGNOSIS — L570 Actinic keratosis: Secondary | ICD-10-CM | POA: Diagnosis not present

## 2020-02-24 DIAGNOSIS — D2270 Melanocytic nevi of unspecified lower limb, including hip: Secondary | ICD-10-CM | POA: Diagnosis not present

## 2020-02-24 DIAGNOSIS — D849 Immunodeficiency, unspecified: Secondary | ICD-10-CM | POA: Diagnosis not present

## 2020-02-24 DIAGNOSIS — D485 Neoplasm of uncertain behavior of skin: Secondary | ICD-10-CM | POA: Diagnosis not present

## 2020-02-24 DIAGNOSIS — D225 Melanocytic nevi of trunk: Secondary | ICD-10-CM | POA: Diagnosis not present

## 2020-02-24 DIAGNOSIS — L814 Other melanin hyperpigmentation: Secondary | ICD-10-CM | POA: Diagnosis not present

## 2020-02-24 DIAGNOSIS — L603 Nail dystrophy: Secondary | ICD-10-CM | POA: Diagnosis not present

## 2020-02-24 DIAGNOSIS — D2260 Melanocytic nevi of unspecified upper limb, including shoulder: Secondary | ICD-10-CM | POA: Diagnosis not present

## 2020-02-24 DIAGNOSIS — Z85828 Personal history of other malignant neoplasm of skin: Secondary | ICD-10-CM | POA: Diagnosis not present

## 2020-02-24 DIAGNOSIS — Q828 Other specified congenital malformations of skin: Secondary | ICD-10-CM | POA: Diagnosis not present

## 2020-03-02 ENCOUNTER — Telehealth: Payer: Self-pay | Admitting: Neurology

## 2020-03-02 ENCOUNTER — Other Ambulatory Visit: Payer: Self-pay

## 2020-03-02 ENCOUNTER — Ambulatory Visit (INDEPENDENT_AMBULATORY_CARE_PROVIDER_SITE_OTHER): Payer: Medicare Other | Admitting: Neurology

## 2020-03-02 ENCOUNTER — Encounter: Payer: Self-pay | Admitting: Neurology

## 2020-03-02 VITALS — BP 123/74 | HR 72 | Ht 65.0 in | Wt 139.5 lb

## 2020-03-02 DIAGNOSIS — G4489 Other headache syndrome: Secondary | ICD-10-CM

## 2020-03-02 DIAGNOSIS — M542 Cervicalgia: Secondary | ICD-10-CM

## 2020-03-02 DIAGNOSIS — M6281 Muscle weakness (generalized): Secondary | ICD-10-CM | POA: Diagnosis not present

## 2020-03-02 DIAGNOSIS — R42 Dizziness and giddiness: Secondary | ICD-10-CM | POA: Diagnosis not present

## 2020-03-02 DIAGNOSIS — Z8612 Personal history of poliomyelitis: Secondary | ICD-10-CM

## 2020-03-02 MED ORDER — DULOXETINE HCL 30 MG PO CPEP: 30.0000 mg | ORAL_CAPSULE | Freq: Every day | ORAL | 3 refills | Status: DC

## 2020-03-02 NOTE — Progress Notes (Signed)
Reason for visit: Dysesthesias of Cassie legs  Referring physician: Dr. Laurette Schimke is a 74 y.o. female  History of present illness:  Cassie Harvey is a 74 year old right-handed white female with a history of polio affecting primarily Cassie left leg.  Cassie Harvey has had an almost 18-year history of some burning dysesthesias affecting Cassie legs bilaterally, right greater than left going from Cassie thigh level all Cassie way down to Cassie feet.  Cassie symptoms are worse at nighttime, Cassie Harvey does not have much problem during Cassie daytime.  Cassie Harvey has weakness of Cassie left leg that has been persistent throughout her life.  Cassie Harvey has had a right total knee replacement.  Cassie Harvey has severe arthritis of Cassie left knee and may be considering surgery in Cassie near future.  Cassie Harvey is followed through [facility] following a heart transplant.  Cassie Harvey does report some chronic low back pain and is followed by Dr. Ellene Route.  Cassie Harvey has over Cassie last year developed some discomfort in Cassie back of Cassie neck bilaterally as Cassie Harvey describes as an ice cream type headache.  Cassie Harvey may have some dizziness off and on associated with this.  Cassie Harvey reports no discomfort going down Cassie arms on either side.  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  Cassie Harvey has not had any recent falls.  Cassie Harvey comes to this office for further evaluation.  Past Medical History:  Diagnosis Date  . Broken shoulder    right  . Chronic pain   . DDD (degenerative disc disease), lumbar   . DJD (degenerative joint disease)   . Family history of anesthesia complication    My father suffered from a dysrythmia  . Heart transplanted ([place]) 2004  . 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 Hypertension   . IBS (irritable bowel syndrome)   . Immunosuppression ([place])   . Molar pregnancy    Tx; with chemo  . Mouth ulcers   . Neuropathy, peripheral   . Pacemaker 01/13/2003  . Pneumonia   . PONV (postoperative nausea and vomiting)   . Urinary frequency     . Wrist fracture, bilateral    Surgical repair left    Past Surgical History:  Procedure Laterality Date  . APPENDECTOMY    . CARDIAC CATHETERIZATION  2003   2015  . COLONOSCOPY    . COLONOSCOPY N/A 01/12/2014   Procedure: COLONOSCOPY;  Surgeon: Juanita Craver, MD;  Location: [facility];  Service: Endoscopy;  Laterality: N/A;  . 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 EAR EXAMINATION UNDER ANESTHESIA  1977   lt middle ear   . ELBOW ARTHROPLASTY     rt  . HEART TRANSPLANT  11/2002  . INJECTION KNEE Left 01/16/2013   Procedure: KNEE INJECTION;  Surgeon: [name], MD;  Location: [place];  Service: Orthopedics;  Laterality: Left;  STEROID   . KNEE ARTHROSCOPY  1998   rt  . KNEE ARTHROSCOPY WITH MEDIAL MENISECTOMY Right 01/16/2013   Procedure: RIGHT KNEE ARTHROSCOPY WITH MEDIAL AND LATERAL MENISECTOMY, REMOVAL LOOSE FOREIGN BODY AND STEROID INJECTION;  Surgeon: [name], MD;  Location: [place];  Service: Orthopedics;  Laterality: Right;  DEPRIEDMENT LATERAL MENISCUS AND CHONDROPLASTY  . PACEMAKER INSERTION  8144,8185   new dual chamber -medtronic 5076-art/vent leads  . SEPTOPLASTY  1992  . SHOULDER ARTHROSCOPY  96,02   right abd left  .  STERIOD INJECTION Left 01/16/2013   Procedure: STEROID INJECTION;  Surgeon: [name], MD;  Location: [place];  Service: Orthopedics;  Laterality: Left;  . TOTAL HIP ARTHROPLASTY  02/2011   rt  . TOTAL KNEE ARTHROPLASTY Right 02/25/2014   Procedure: RIGHT TOTAL KNEE ARTHROPLASTY;  Surgeon: [name], MD;  Location: [place];  Service: Orthopedics;  Laterality: Right;  . TUBAL LIGATION  1990  . WRIST ARTHROPLASTY  2008   left    Family History  Problem Relation Age of Onset  . Pulmonary fibrosis Father     Social history:  reports that Cassie Harvey has never smoked. Cassie Harvey has never used smokeless tobacco. Cassie Harvey reports that Cassie Harvey does not drink alcohol  or use drugs.  Medications:  Prior to Admission medications   Medication Sig Start Date End Date Taking? Authorizing Provider  ASPIRIN 81 PO Take by mouth.   Yes [provider]  B Complex-C (SUPER B COMPLEX PO) Take by mouth.   Yes [provider]  calcium-vitamin D (OSCAL WITH D) 500-200 MG-UNIT per tablet Take 1 tablet by mouth 2 (two) times daily.   Yes [provider]  carvedilol (COREG) 3.125 MG tablet Take 3.125 mg by mouth 2 (two) times daily with a meal.   Yes [provider]  cycloSPORINE modified (NEORAL) 25 MG capsule Take 25 mg by mouth 2 (two) times daily. Harvey takes 70 in Cassie am and 75 at 10pm   Yes [provider]  ezetimibe (ZETIA) 10 MG tablet every evening.  10/30/17  Yes [provider]  folic acid (FOLVITE) 1 MG tablet daily.  09/23/17  Yes [provider]  Multiple Vitamin (MULTIVITAMIN WITH MINERALS) TABS tablet Take 1 tablet by mouth daily.   Yes [provider]  mycophenolate (CELLCEPT) 500 MG tablet Take by mouth every evening. Takes 500 and 250 in Cassie evening per Harvey   Yes [provider]  niacin (NIASPAN) 500 MG CR tablet Take 500 mg by mouth at bedtime.   Yes [provider]  Omega-3 Fatty Acids (FISH OIL PO) Take 1,000 mg by mouth 3 (three) times daily.    Yes [provider]  ondansetron (ZOFRAN) 8 MG tablet Take by mouth every 8 (eight) hours as needed for nausea.   Yes [provider]  solifenacin (VESICARE) 10 MG tablet Take by mouth daily.   Yes [provider]  UNABLE TO FIND metagenics-ultraflora IB 60BN CFu once a day   Yes [provider]  valACYclovir (VALTREX) 500 MG tablet Take 500 mg by mouth daily.   Yes [provider]  VITAMIN D PO Take 1,000 Units by mouth 3 (three) times daily.    Yes [provider]      Allergies  Allergen Reactions  . Oxybutynin Shortness Of Breath  . Bacitracin Swelling  .  Lisinopril Cough  . Neomycin Swelling  . Sulfa Antibiotics Rash  . Sulfabenzamide Rash    ROS:  Out of a complete 14 system review of symptoms, Cassie Harvey complains only of Cassie following symptoms, and all other reviewed systems are negative.  Swelling in Cassie legs Hearing loss Incontinence of Cassie bowel and bladder Joint pain, cramps, aching muscles Allergies Memory loss, confusion, dizziness Insomnia, restless legs  Blood pressure 123/74, pulse 72, height 5\' 5"  (1.651 m), weight 139 lb 8 oz (63.3 kg).  Physical Exam  General: Cassie Harvey is alert and cooperative at Cassie time of Cassie examination.  Eyes: Pupils  are equal, round, and reactive to light. Discs are flat bilaterally.  Neck: Cassie neck is supple, no carotid bruits are noted.  Respiratory: Cassie respiratory examination is clear.  Cardiovascular: Cassie cardiovascular examination reveals a regular rate and rhythm, no obvious murmurs or rubs are noted.  Skin: Extremities are without significant edema.  Neurologic Exam  Mental status: Cassie Harvey is alert and oriented x 3 at Cassie time of Cassie examination. Cassie Harvey has apparent normal recent and remote memory, with an apparently normal attention span and concentration ability.  Cranial nerves: Facial symmetry is present. There is good sensation of Cassie face to pinprick and soft touch bilaterally. Cassie strength of Cassie facial muscles and Cassie muscles to head turning and shoulder shrug are normal bilaterally. Speech is well enunciated, no aphasia or dysarthria is noted. Extraocular movements are full. Visual fields are full. Cassie tongue is midline, and Cassie Harvey has symmetric elevation of Cassie soft palate. No obvious hearing deficits are noted.  Motor: Cassie motor testing reveals 5 over 5 strength of all 4 extremities, with exception of Cassie left leg with 4 -/5 strength with hip flexion, 4/5 strength with adduction, 4 -/5 strength with abduction, 1/5 strength with knee extension and with  dorsi flexion of Cassie foot, 4 -/5 strength with knee flexion.  Cassie Harvey also has some weakness with hip flexion on Cassie right.Kermit Balo symmetric motor tone is noted throughout.  Sensory: Sensory testing is intact to pinprick, soft touch, vibration sensation, and position sense on all 4 extremities. No evidence of extinction is noted.  Coordination: Cerebellar testing reveals good finger-nose-finger bilaterally.  Cassie Harvey is not able to form heel-to-shin on Cassie left leg, can perform on Cassie right.  Gait and station: Gait is wide-based, Cassie Harvey walks with a cane.  Romberg is negative but is slightly unsteady.  Tandem gait was not attempted.  Reflexes: Deep tendon reflexes are symmetric, but are depressed bilaterally. Toes are downgoing bilaterally.   Assessment/Plan:  1.  History of polio affecting primarily Cassie left leg  2.  Chronic dysesthesias, lower extremities  3.  Gait disorder  4.  Onset of neck discomfort, posterior headache, dizziness  Cassie Harvey likely has cervicogenic headache, we will check CT of Cassie cervical spine and head.  Cassie Harvey has dysesthesias of Cassie lower extremities, EMG and nerve conduction study done in 2015 did not show clear evidence of a true peripheral neuropathy.  Cassie Harvey does have chronic low back pain.  Cassie Harvey will be placed on Cymbalta and in Cassie evening hours, Cassie Harvey did not tolerate gabapentin and Lyrica previously due to leg swelling.  Cassie Harvey will follow-up here in 5 or 6 months.  Cassie Harvey will call for any dose adjustments of Cassie medication.  Jill Alexanders MD 03/02/2020 12:19 PM  [facility] [address] [place], [place] [address]  Phone [phone] Fax [phone]

## 2020-03-02 NOTE — Patient Instructions (Signed)
We will start cymbalta for the leg pain.  Cymbalta (duloxetine) is an antidepressant medication that is commonly used for peripheral neuropathy pain or for fibromyalgia pain. As with any antidepressant medication, worsening depression can be seen. This medication can potentially cause headache, dizziness, sexual dysfunction, or nausea. If any problems are noted on this medication, please contact our office.

## 2020-03-02 NOTE — Telephone Encounter (Signed)
Medicare/aarp order sent to [facility]. No auth they will reach out to the patient to schedule.  

## 2020-03-08 ENCOUNTER — Other Ambulatory Visit: Payer: Medicare Other

## 2020-03-09 ENCOUNTER — Ambulatory Visit
Admission: RE | Admit: 2020-03-09 | Discharge: 2020-03-09 | Disposition: A | Discharge status: Home / Self Care | Payer: Medicare Other | Source: Ambulatory Visit | Attending: Neurology | Admitting: Neurology

## 2020-03-09 DIAGNOSIS — G4489 Other headache syndrome: Secondary | ICD-10-CM

## 2020-03-09 DIAGNOSIS — R42 Dizziness and giddiness: Secondary | ICD-10-CM

## 2020-03-09 DIAGNOSIS — M542 Cervicalgia: Secondary | ICD-10-CM

## 2020-03-09 DIAGNOSIS — M47812 Spondylosis without myelopathy or radiculopathy, cervical region: Secondary | ICD-10-CM | POA: Diagnosis not present

## 2020-03-11 NOTE — Progress Notes (Signed)
Kindly inform the patient that CT scan of the neck showed mild wear-and-tear degenerative changes but no spinal stenosis or pinched nerve to worry about

## 2020-03-11 NOTE — Progress Notes (Signed)
Kindly inform the patient that CT scan of the head was unremarkable

## 2020-03-17 ENCOUNTER — Telehealth: Payer: Self-pay | Admitting: *Deleted

## 2020-03-17 NOTE — Telephone Encounter (Signed)
FYI pt called back and the message from RN was read to pt.  No call back requested, pt had no questions.

## 2020-03-17 NOTE — Telephone Encounter (Signed)
Noted thanks °

## 2020-03-17 NOTE — Telephone Encounter (Signed)
I called pt & LVM w/ office number asking for call back. When she calls back, please let her know that the CT of her neck showed mild wear-and-tear degenerative changes but no spinal stenosis or pinched nerve to worry about. Her CT scan of the head was unremarkable with no concerns. Let me know if she has any questions.   Garvin Fila, MD  03/11/2020 5:25 PM EDT Back to Top    Kindly inform the patient that CT scan of the neck showed mild wear-and-tear degenerative changes but no spinal stenosis or pinched nerve to worry about   Garvin Fila, MD  03/11/2020 5:23 PM EDT Back to Top    Kindly inform the patient that CT scan of the head was unremarkable

## 2020-03-18 ENCOUNTER — Other Ambulatory Visit: Payer: 59

## 2020-03-22 ENCOUNTER — Other Ambulatory Visit: Payer: Self-pay

## 2020-03-22 ENCOUNTER — Ambulatory Visit (INDEPENDENT_AMBULATORY_CARE_PROVIDER_SITE_OTHER): Payer: Medicare Other | Admitting: *Deleted

## 2020-03-22 DIAGNOSIS — B351 Tinea unguium: Secondary | ICD-10-CM

## 2020-03-22 DIAGNOSIS — L603 Nail dystrophy: Secondary | ICD-10-CM

## 2020-03-22 NOTE — Progress Notes (Signed)
Patient presents today for the 2nd laser treatment. Diagnosed with mycotic nail infection by Dr. Milinda Pointer.   Toenail most affected hallux nail right. She has had laser treatment in the past as well. The nail is looking a lot smoother.  All other systems are negative.  Nails were filed thin. Laser therapy was administered to 1st toenails right and patient tolerated the treatment well. All safety precautions were in place.    Follow up in 4 weeks for laser # 3.

## 2020-04-01 DIAGNOSIS — C44629 Squamous cell carcinoma of skin of left upper limb, including shoulder: Secondary | ICD-10-CM | POA: Diagnosis not present

## 2020-04-01 DIAGNOSIS — C44519 Basal cell carcinoma of skin of other part of trunk: Secondary | ICD-10-CM | POA: Diagnosis not present

## 2020-04-01 DIAGNOSIS — Z85828 Personal history of other malignant neoplasm of skin: Secondary | ICD-10-CM | POA: Diagnosis not present

## 2020-04-09 DIAGNOSIS — Z950 Presence of cardiac pacemaker: Secondary | ICD-10-CM | POA: Diagnosis not present

## 2020-04-09 DIAGNOSIS — Z45018 Encounter for adjustment and management of other part of cardiac pacemaker: Secondary | ICD-10-CM | POA: Diagnosis not present

## 2020-04-12 DIAGNOSIS — Z950 Presence of cardiac pacemaker: Secondary | ICD-10-CM | POA: Diagnosis not present

## 2020-04-12 DIAGNOSIS — Z45018 Encounter for adjustment and management of other part of cardiac pacemaker: Secondary | ICD-10-CM | POA: Diagnosis not present

## 2020-05-03 ENCOUNTER — Other Ambulatory Visit: Payer: Self-pay

## 2020-05-03 ENCOUNTER — Ambulatory Visit (INDEPENDENT_AMBULATORY_CARE_PROVIDER_SITE_OTHER): Payer: Medicare Other | Admitting: *Deleted

## 2020-05-03 DIAGNOSIS — M79676 Pain in unspecified toe(s): Secondary | ICD-10-CM

## 2020-05-03 DIAGNOSIS — L603 Nail dystrophy: Secondary | ICD-10-CM

## 2020-05-03 NOTE — Progress Notes (Signed)
Patient presents today for the 3rd laser treatment. Diagnosed with mycotic nail infection by Dr. Milinda Pointer.   Toenail most affected hallux nail right. She has had laser treatment in the past as well. The nail is looking much better.  All other systems are negative.  Nails were filed thin. Laser therapy was administered to 1st toenails right and patient tolerated the treatment well. All safety precautions were in place.    Follow up in 6 weeks for laser # 4.

## 2020-05-10 DIAGNOSIS — S52501A Unspecified fracture of the lower end of right radius, initial encounter for closed fracture: Secondary | ICD-10-CM | POA: Diagnosis not present

## 2020-05-11 DIAGNOSIS — S52501D Unspecified fracture of the lower end of right radius, subsequent encounter for closed fracture with routine healing: Secondary | ICD-10-CM | POA: Diagnosis not present

## 2020-05-25 DIAGNOSIS — Z85828 Personal history of other malignant neoplasm of skin: Secondary | ICD-10-CM | POA: Diagnosis not present

## 2020-05-25 DIAGNOSIS — D2260 Melanocytic nevi of unspecified upper limb, including shoulder: Secondary | ICD-10-CM | POA: Diagnosis not present

## 2020-05-25 DIAGNOSIS — L814 Other melanin hyperpigmentation: Secondary | ICD-10-CM | POA: Diagnosis not present

## 2020-05-25 DIAGNOSIS — L309 Dermatitis, unspecified: Secondary | ICD-10-CM | POA: Diagnosis not present

## 2020-05-25 DIAGNOSIS — D485 Neoplasm of uncertain behavior of skin: Secondary | ICD-10-CM | POA: Diagnosis not present

## 2020-05-25 DIAGNOSIS — D045 Carcinoma in situ of skin of trunk: Secondary | ICD-10-CM | POA: Diagnosis not present

## 2020-05-25 DIAGNOSIS — D1801 Hemangioma of skin and subcutaneous tissue: Secondary | ICD-10-CM | POA: Diagnosis not present

## 2020-05-25 DIAGNOSIS — D2270 Melanocytic nevi of unspecified lower limb, including hip: Secondary | ICD-10-CM | POA: Diagnosis not present

## 2020-05-25 DIAGNOSIS — Q828 Other specified congenital malformations of skin: Secondary | ICD-10-CM | POA: Diagnosis not present

## 2020-05-25 DIAGNOSIS — L603 Nail dystrophy: Secondary | ICD-10-CM | POA: Diagnosis not present

## 2020-05-25 DIAGNOSIS — D849 Immunodeficiency, unspecified: Secondary | ICD-10-CM | POA: Diagnosis not present

## 2020-05-25 DIAGNOSIS — L570 Actinic keratosis: Secondary | ICD-10-CM | POA: Diagnosis not present

## 2020-05-27 DIAGNOSIS — S52501D Unspecified fracture of the lower end of right radius, subsequent encounter for closed fracture with routine healing: Secondary | ICD-10-CM | POA: Diagnosis not present

## 2020-05-28 DIAGNOSIS — Z23 Encounter for immunization: Secondary | ICD-10-CM | POA: Diagnosis not present

## 2020-06-14 ENCOUNTER — Other Ambulatory Visit: Payer: Self-pay

## 2020-06-14 ENCOUNTER — Ambulatory Visit (INDEPENDENT_AMBULATORY_CARE_PROVIDER_SITE_OTHER): Payer: Medicare Other | Admitting: *Deleted

## 2020-06-14 DIAGNOSIS — L603 Nail dystrophy: Secondary | ICD-10-CM

## 2020-06-14 NOTE — Progress Notes (Signed)
Patient presents today for the 4th laser treatment. Diagnosed with mycotic nail infection by Dr. Milinda Pointer.   Toenail most affected hallux nail right. She has had laser treatment in the past as well. The nail is almost clear.  All other systems are negative.  Nails were filed thin. Laser therapy was administered to 1st toenails right and patient tolerated the treatment well. All safety precautions were in place.    Follow up in 6 weeks for laser # 5.

## 2020-06-15 DIAGNOSIS — L565 Disseminated superficial actinic porokeratosis (DSAP): Secondary | ICD-10-CM | POA: Diagnosis not present

## 2020-06-15 DIAGNOSIS — D0461 Carcinoma in situ of skin of right upper limb, including shoulder: Secondary | ICD-10-CM | POA: Diagnosis not present

## 2020-06-15 DIAGNOSIS — D692 Other nonthrombocytopenic purpura: Secondary | ICD-10-CM | POA: Diagnosis not present

## 2020-06-15 DIAGNOSIS — D849 Immunodeficiency, unspecified: Secondary | ICD-10-CM | POA: Diagnosis not present

## 2020-06-17 DIAGNOSIS — S52501D Unspecified fracture of the lower end of right radius, subsequent encounter for closed fracture with routine healing: Secondary | ICD-10-CM | POA: Diagnosis not present

## 2020-06-30 ENCOUNTER — Other Ambulatory Visit: Payer: Self-pay | Admitting: Neurology

## 2020-06-30 DIAGNOSIS — C44519 Basal cell carcinoma of skin of other part of trunk: Secondary | ICD-10-CM | POA: Diagnosis not present

## 2020-07-04 DIAGNOSIS — Z23 Encounter for immunization: Secondary | ICD-10-CM | POA: Diagnosis not present

## 2020-07-09 DIAGNOSIS — Z950 Presence of cardiac pacemaker: Secondary | ICD-10-CM | POA: Diagnosis not present

## 2020-07-09 DIAGNOSIS — Z45018 Encounter for adjustment and management of other part of cardiac pacemaker: Secondary | ICD-10-CM | POA: Diagnosis not present

## 2020-07-15 DIAGNOSIS — Z45018 Encounter for adjustment and management of other part of cardiac pacemaker: Secondary | ICD-10-CM | POA: Diagnosis not present

## 2020-07-15 DIAGNOSIS — R5383 Other fatigue: Secondary | ICD-10-CM | POA: Diagnosis not present

## 2020-07-15 DIAGNOSIS — Z950 Presence of cardiac pacemaker: Secondary | ICD-10-CM | POA: Diagnosis not present

## 2020-07-15 DIAGNOSIS — S52501D Unspecified fracture of the lower end of right radius, subsequent encounter for closed fracture with routine healing: Secondary | ICD-10-CM | POA: Diagnosis not present

## 2020-07-15 DIAGNOSIS — M810 Age-related osteoporosis without current pathological fracture: Secondary | ICD-10-CM | POA: Diagnosis not present

## 2020-07-27 DIAGNOSIS — M810 Age-related osteoporosis without current pathological fracture: Secondary | ICD-10-CM | POA: Diagnosis not present

## 2020-07-27 DIAGNOSIS — E559 Vitamin D deficiency, unspecified: Secondary | ICD-10-CM | POA: Diagnosis not present

## 2020-08-03 DIAGNOSIS — H90A31 Mixed conductive and sensorineural hearing loss, unilateral, right ear with restricted hearing on the contralateral side: Secondary | ICD-10-CM | POA: Diagnosis not present

## 2020-08-03 DIAGNOSIS — H90A22 Sensorineural hearing loss, unilateral, left ear, with restricted hearing on the contralateral side: Secondary | ICD-10-CM | POA: Diagnosis not present

## 2020-08-04 DIAGNOSIS — M8588 Other specified disorders of bone density and structure, other site: Secondary | ICD-10-CM | POA: Diagnosis not present

## 2020-08-04 DIAGNOSIS — M810 Age-related osteoporosis without current pathological fracture: Secondary | ICD-10-CM | POA: Diagnosis not present

## 2020-08-11 DIAGNOSIS — Z941 Heart transplant status: Secondary | ICD-10-CM | POA: Diagnosis not present

## 2020-08-11 DIAGNOSIS — Z48298 Encounter for aftercare following other organ transplant: Secondary | ICD-10-CM | POA: Diagnosis not present

## 2020-08-11 DIAGNOSIS — Z9225 Personal history of immunosupression therapy: Secondary | ICD-10-CM | POA: Diagnosis not present

## 2020-08-17 DIAGNOSIS — Q828 Other specified congenital malformations of skin: Secondary | ICD-10-CM | POA: Diagnosis not present

## 2020-08-17 DIAGNOSIS — L309 Dermatitis, unspecified: Secondary | ICD-10-CM | POA: Diagnosis not present

## 2020-08-17 DIAGNOSIS — D1801 Hemangioma of skin and subcutaneous tissue: Secondary | ICD-10-CM | POA: Diagnosis not present

## 2020-08-17 DIAGNOSIS — L910 Hypertrophic scar: Secondary | ICD-10-CM | POA: Diagnosis not present

## 2020-08-17 DIAGNOSIS — L814 Other melanin hyperpigmentation: Secondary | ICD-10-CM | POA: Diagnosis not present

## 2020-08-17 DIAGNOSIS — D485 Neoplasm of uncertain behavior of skin: Secondary | ICD-10-CM | POA: Diagnosis not present

## 2020-08-17 DIAGNOSIS — L570 Actinic keratosis: Secondary | ICD-10-CM | POA: Diagnosis not present

## 2020-08-17 DIAGNOSIS — Z85828 Personal history of other malignant neoplasm of skin: Secondary | ICD-10-CM | POA: Diagnosis not present

## 2020-08-17 DIAGNOSIS — L578 Other skin changes due to chronic exposure to nonionizing radiation: Secondary | ICD-10-CM | POA: Diagnosis not present

## 2020-08-17 DIAGNOSIS — L821 Other seborrheic keratosis: Secondary | ICD-10-CM | POA: Diagnosis not present

## 2020-08-17 DIAGNOSIS — D2260 Melanocytic nevi of unspecified upper limb, including shoulder: Secondary | ICD-10-CM | POA: Diagnosis not present

## 2020-08-17 DIAGNOSIS — D849 Immunodeficiency, unspecified: Secondary | ICD-10-CM | POA: Diagnosis not present

## 2020-09-06 ENCOUNTER — Encounter: Payer: Self-pay | Admitting: Neurology

## 2020-09-06 ENCOUNTER — Ambulatory Visit (INDEPENDENT_AMBULATORY_CARE_PROVIDER_SITE_OTHER): Payer: Medicare Other | Admitting: Neurology

## 2020-09-06 VITALS — BP 127/80 | HR 77 | Ht 65.0 in | Wt 135.0 lb

## 2020-09-06 DIAGNOSIS — R42 Dizziness and giddiness: Secondary | ICD-10-CM

## 2020-09-06 DIAGNOSIS — R208 Other disturbances of skin sensation: Secondary | ICD-10-CM

## 2020-09-06 DIAGNOSIS — R2689 Other abnormalities of gait and mobility: Secondary | ICD-10-CM | POA: Diagnosis not present

## 2020-09-06 MED ORDER — DULOXETINE HCL 30 MG PO CPEP: 30.0000 mg | ORAL_CAPSULE | Freq: Two times a day (BID) | ORAL | 5 refills | Status: DC

## 2020-09-06 NOTE — Progress Notes (Signed)
PATIENT: Cassie Harvey DOB: 1946/01/24  REASON FOR VISIT: follow up HISTORY FROM: patient  HISTORY OF PRESENT ILLNESS: Today 09/06/20 Cassie Harvey is a 74 year old female with history of polio affecting the left leg, 18-year history of burning dysesthesias affecting the legs bilaterally, right more than the left.  She is followed through [facility] post heart transplant.  Sees Dr. Ellene Route for chronic low back pain (had back injections almost 2 years, just starting to wear off).  Has complained of neck discomfort, dizziness, posterior headache, no longer has this with Cymbalta 30 mg at bedtime. EMG/NCV in 2015 did not show clear evidence of a true peripheral neuropathy.  Doing well on Cymbalta, has not tolerated gabapentin or Lyrica.  CT cervical spine showed mild degenerative changes, CT head was unremarkable.  Of note is noted, continues to report burning to the right leg worse at night, keeps her awake, she takes Tylenol, falls asleep.  Walking with a cane.  Needs a left knee replacement, has been putting off. Healing right wrist fracture, wearing brace. Is on several vitamins.  Here today for follow-up unaccompanied.  HISTORY 03/02/2020 Dr. Jannifer Franklin: Cassie Harvey is a 74 year old right-handed white female with a history of polio affecting primarily the left leg.  The patient has had an almost 18-year history of some burning dysesthesias affecting the legs bilaterally, right greater than left going from the thigh level all the way down to the feet.  The symptoms are worse at nighttime, she does not have much problem during the daytime.  She has weakness of the left leg that has been persistent throughout her life.  She has had a right total knee replacement.  She has severe arthritis of the left knee and may be considering surgery in the near future.  She is followed through [facility] following a heart transplant.  She does report some chronic low back pain and is followed by Dr. Ellene Route.  She has over the  last year developed some discomfort in the back of the neck bilaterally as she describes as an ice cream type headache.  The patient may have some dizziness off and on associated with this.  The patient reports no discomfort going down the arms on either side.  The patient has a gait disorder, she uses a cane for ambulation.  She has not had any recent falls.  She comes to this office for further evaluation.   REVIEW OF SYSTEMS: Out of a complete 14 system review of symptoms, the patient complains only of the following symptoms, and all other reviewed systems are negative.  Burning, back pain  ALLERGIES: Allergies  Allergen Reactions  . Oxybutynin Shortness Of Breath  . Bacitracin Swelling  . Lisinopril Cough  . Neomycin Swelling  . Sulfa Antibiotics Rash  . Sulfabenzamide Rash    HOME MEDICATIONS: Outpatient Medications Prior to Visit  Medication Sig Dispense Refill  . Ascorbic Acid (VITAMIN C PO) Take 500 mg by mouth in the morning, at noon, and at bedtime.    . ASPIRIN 81 PO Take by mouth.    . B Complex-C (SUPER B COMPLEX PO) Take by mouth.    . calcium-vitamin D (OSCAL WITH D) 500-200 MG-UNIT per tablet Take 1 tablet by mouth 2 (two) times daily.    . carvedilol (COREG) 3.125 MG tablet Take 3.125 mg by mouth 2 (two) times daily with a meal.    . cycloSPORINE modified (NEORAL) 25 MG capsule Take 25 mg by mouth 2 (two) times daily.  Patient takes 75 in the am and 75 at 10pm    . ezetimibe (ZETIA) 10 MG tablet every evening.   7  . folic acid (FOLVITE) 1 MG tablet daily.   10  . Multiple Vitamin (MULTIVITAMIN WITH MINERALS) TABS tablet Take 1 tablet by mouth daily.    . mycophenolate (CELLCEPT) 500 MG tablet Take 500 mg by mouth 2 (two) times daily.    . niacin (NIASPAN) 500 MG CR tablet Take 500 mg by mouth at bedtime.    . Omega-3 Fatty Acids (FISH OIL PO) Take 1,000 mg by mouth 3 (three) times daily.     . ondansetron (ZOFRAN) 8 MG tablet Take by mouth every 8 (eight) hours as  needed for nausea.    . solifenacin (VESICARE) 10 MG tablet Take by mouth daily.    Marland Kitchen UNABLE TO FIND metagenics-ultraflora IB 60BN CFu once a day    . valACYclovir (VALTREX) 500 MG tablet Take 500 mg by mouth daily.    Marland Kitchen VITAMIN D PO Take 2,000 Units by mouth 3 (three) times daily.    . DULoxetine (CYMBALTA) 30 MG capsule TAKE 1 CAPSULE(30 MG) BY MOUTH AT BEDTIME 30 capsule 3   No facility-administered medications prior to visit.    PAST MEDICAL HISTORY: Past Medical History:  Diagnosis Date  . Broken shoulder    right  . Chronic pain   . DDD (degenerative disc disease), lumbar   . DJD (degenerative joint disease)   . Family history of anesthesia complication    My father suffered from a dysrythmia  . Heart transplanted ([place]) 2004  . 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 Hypertension   . IBS (irritable bowel syndrome)   . Immunosuppression ([place])   . Molar pregnancy    Tx; with chemo  . Mouth ulcers   . Neuropathy, peripheral   . Pacemaker 01/13/2003  . Pneumonia   . PONV (postoperative nausea and vomiting)   . Urinary frequency   . Wrist fracture, bilateral    Surgical repair left    PAST SURGICAL HISTORY: Past Surgical History:  Procedure Laterality Date  . APPENDECTOMY    . CARDIAC CATHETERIZATION  2003   2015  . COLONOSCOPY    . COLONOSCOPY N/A 01/12/2014   Procedure: COLONOSCOPY;  Surgeon: Juanita Craver, MD;  [facility];  Service: Endoscopy;  Laterality: N/A;  . 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 EAR EXAMINATION UNDER ANESTHESIA  1977   lt middle ear   . ELBOW ARTHROPLASTY     rt  . HEART TRANSPLANT  11/2002  . INJECTION KNEE Left 01/16/2013   Procedure: KNEE INJECTION;  Surgeon: [name], MD;  Location: [place];  Service: Orthopedics;  Laterality: Left;  STEROID   . KNEE ARTHROSCOPY  1998   rt  . KNEE ARTHROSCOPY WITH MEDIAL MENISECTOMY Right 01/16/2013   Procedure: RIGHT KNEE  ARTHROSCOPY WITH MEDIAL AND LATERAL MENISECTOMY, REMOVAL LOOSE FOREIGN BODY AND STEROID INJECTION;  Surgeon: [name], MD;  Location: [place];  Service: Orthopedics;  Laterality: Right;  DEPRIEDMENT LATERAL MENISCUS AND CHONDROPLASTY  . PACEMAKER INSERTION  6269,4854   new dual chamber -medtronic 5076-art/vent leads  . SEPTOPLASTY  1992  . SHOULDER ARTHROSCOPY  96,02   right abd left  . STERIOD INJECTION Left 01/16/2013   Procedure: STEROID INJECTION;  Surgeon: [name], MD;  Location: [place];  Service: Orthopedics;  Laterality:  Left;  . TOTAL HIP ARTHROPLASTY  02/2011   rt  . TOTAL KNEE ARTHROPLASTY Right 02/25/2014   Procedure: RIGHT TOTAL KNEE ARTHROPLASTY;  Surgeon: [name], MD;  Location: [place];  Service: Orthopedics;  Laterality: Right;  . TUBAL LIGATION  1990  . WRIST ARTHROPLASTY  2008   left    FAMILY HISTORY: Family History  Problem Relation Age of Onset  . Pulmonary fibrosis Father     SOCIAL HISTORY: Social History   Socioeconomic History  . Marital status: Married    Spouse name: Not on file  . Number of children: 0  . Years of education: college  . Highest education level: Not on file  Occupational History  . Occupation: Retired  Tobacco Use  . Smoking status: Never Smoker  . Smokeless tobacco: Never Used  Substance and Sexual Activity  . Alcohol use: No  . Drug use: No  . Sexual activity: Not on file  Other Topics Concern  . Not on file  Social History Narrative  . Not on file   Social Determinants of Health   Financial Resource Strain: Not on file  Food Insecurity: Not on file  Transportation Needs: Not on file  Physical Activity: Not on file  Stress: Not on file  Social Connections: Not on file  Intimate Partner Violence: Not on file   PHYSICAL EXAM  Vitals:   09/06/20 1244  BP: 127/80  Pulse: 77  Weight: 135 lb (61.2 kg)  Height: 5\' 5"  (1.651 m)   Body mass index is 22.47  kg/m.  Generalized: Well developed, in no acute distress   Neurological examination  Mentation: Alert oriented to time, place, history taking. Follows all commands speech and language fluent Cranial nerve II-XII: Pupils were equal round reactive to light. Extraocular movements were full, visual field were full on confrontational test. Facial sensation and strength were normal. Head turning and shoulder shrug were normal and symmetric. Motor: 5/5 strength of all extremities, exception left leg 4/5 with hip flexion, 2/5 knee extension, wearing knee brace left Sensory: Sensory testing is intact to soft touch on all 4 extremities. No evidence of extinction is noted.  Coordination: Cerebellar testing reveals good finger-nose-finger bilaterally Gait and station: Gait is wide-based, walks with a cane Reflexes: Deep tendon reflexes are symmetric but depressed throughout  DIAGNOSTIC DATA (LABS, IMAGING, TESTING) - I reviewed patient records, labs, notes, testing and imaging myself where available.  Lab Results  Component Value Date   WBC 17.8 (H) 02/26/2014   HGB 9.2 (L) 02/26/2014   HCT 27.6 (L) 02/26/2014   MCV 106.2 (H) 02/26/2014   PLT 282 02/26/2014      Component Value Date/Time   NA 141 02/26/2014 0535   K 4.7 02/26/2014 0535   CL 102 02/26/2014 0535   CO2 26 02/26/2014 0535   GLUCOSE 114 (H) 02/26/2014 0535   BUN 25 (H) 02/26/2014 0535   CREATININE 0.92 02/26/2014 0535   CALCIUM 8.4 02/26/2014 0535   GFRNONAA 63 (L) 02/26/2014 0535   GFRAA 73 (L) 02/26/2014 0535   No results found for: CHOL, HDL, LDLCALC, LDLDIRECT, TRIG, CHOLHDL No results found for: HGBA1C No results found for: VITAMINB12 Lab Results  Component Value Date   TSH 2.530 10/16/2013      ASSESSMENT AND PLAN 74 y.o. year old female  has a past medical history of Broken shoulder, Chronic pain, DDD (degenerative disc disease), lumbar, DJD (degenerative joint disease), Family history of anesthesia  complication, Heart transplanted ([place]) (2004),  History of poliomyelitis (10/15/2013), Hypertension, IBS (irritable bowel syndrome), Immunosuppression ([place]), Molar pregnancy, Mouth ulcers, Neuropathy, peripheral, Pacemaker (01/13/2003), Pneumonia, PONV (postoperative nausea and vomiting), Urinary frequency, and Wrist fracture, bilateral. here with:  1.  History of polio affecting primarily the left leg 2.  Chronic dysesthesias, lower extremities 3.  Gait disorder 4.  Neck discomfort, posterior headache, dizziness -Benefited and tolerated Cymbalta, no longer has neck pain/headache  -Increase Cymbalta 30 mg twice a day, to help with burning mostly to the right leg at night (prefers twice daily vs 60 mg at bedtime) -Will continue seeing Dr. 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  This included previsit chart review, lab review, study review, order entry, electronic health record documentation, patient education.    Butler Denmark, AGNP-C, DNP 09/06/2020, 1:25 PM [facility] [address] [place], [place] [address] [phone]

## 2020-09-06 NOTE — Progress Notes (Signed)
I have read the note, and I agree with the clinical assessment and plan.  Tanishia Lemaster K Orla Jolliff   

## 2020-09-06 NOTE — Patient Instructions (Signed)
Increase Cymbalta 30 mg twice daily  Continue seeing Dr. Ellene Route  See you back in 6 months

## 2020-10-01 DIAGNOSIS — Z20822 Contact with and (suspected) exposure to covid-19: Secondary | ICD-10-CM | POA: Diagnosis not present

## 2020-10-04 DIAGNOSIS — Z950 Presence of cardiac pacemaker: Secondary | ICD-10-CM | POA: Diagnosis not present

## 2020-10-08 DIAGNOSIS — Z950 Presence of cardiac pacemaker: Secondary | ICD-10-CM | POA: Diagnosis not present

## 2020-10-08 DIAGNOSIS — Z45018 Encounter for adjustment and management of other part of cardiac pacemaker: Secondary | ICD-10-CM | POA: Diagnosis not present

## 2020-10-12 ENCOUNTER — Other Ambulatory Visit: Payer: Self-pay

## 2020-10-12 ENCOUNTER — Encounter: Payer: Self-pay | Admitting: Podiatry

## 2020-10-12 ENCOUNTER — Ambulatory Visit (INDEPENDENT_AMBULATORY_CARE_PROVIDER_SITE_OTHER): Payer: Medicare Other | Admitting: Podiatry

## 2020-10-12 ENCOUNTER — Ambulatory Visit (INDEPENDENT_AMBULATORY_CARE_PROVIDER_SITE_OTHER): Payer: Medicare Other

## 2020-10-12 DIAGNOSIS — M778 Other enthesopathies, not elsewhere classified: Secondary | ICD-10-CM | POA: Diagnosis not present

## 2020-10-12 DIAGNOSIS — M109 Gout, unspecified: Secondary | ICD-10-CM

## 2020-10-12 MED ORDER — TRIAMCINOLONE ACETONIDE 40 MG/ML IJ SUSP
20.0000 mg | Freq: Once | INTRAMUSCULAR | Status: AC
  Administered 2020-10-12: 20 mg

## 2020-10-12 MED ORDER — METHYLPREDNISOLONE 4 MG PO TBPK: ORAL_TABLET | ORAL | 0 refills | Status: DC

## 2020-10-12 NOTE — Progress Notes (Signed)
She presents today for first metatarsophalangeal joint gout flareup x2 weeks states that the last flareup was in August 2020 she went to [facility] and was given colchicine for that episode which caused severe GI distress and she could not tolerate it and will not take it any longer.  Has recently seen her doctor at [facility] and prescribed her a Medrol Dosepak it improved just a small amount and then has worsened again.  Objective: Vital signs are stable alert and oriented x3.  There are strong palpable pulses bilateral.  Right foot is swollen at the level of metatarsophalangeal joint with erythema.  It is painful on palpation as well as range of motion of the toe.  There is no cellulitic process.  Radiograph does demonstrate mild hallux valgus deformity soft tissue swelling medial aspect of the first metatarsophalangeal joint with no osseous breakdown.  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  Plan: At this point I started her on another Medrol Dosepak and injected periarticular knee with 20 mg Kenalog 5 mg Marcaine around the joint.  She tolerated procedure well.  We will also requesting a CBC CMP and an arthritic profile to better evaluate her since signs and symptoms.  I will follow-up with her in 2 to 3 weeks.

## 2020-10-13 ENCOUNTER — Telehealth: Payer: Self-pay | Admitting: *Deleted

## 2020-10-13 LAB — COMPREHENSIVE METABOLIC PANEL
ALT: 7 IU/L (ref 0–32)
AST: 18 IU/L (ref 0–40)
Albumin/Globulin Ratio: 1.4 (ref 1.2–2.2)
Albumin: 3.9 g/dL (ref 3.7–4.7)
Alkaline Phosphatase: 51 IU/L (ref 44–121)
BUN/Creatinine Ratio: 17 (ref 12–28)
BUN: 28 mg/dL — ABNORMAL HIGH (ref 8–27)
Bilirubin Total: 0.3 mg/dL (ref 0.0–1.2)
CO2: 20 mmol/L (ref 20–29)
Calcium: 9.5 mg/dL (ref 8.7–10.3)
Chloride: 102 mmol/L (ref 96–106)
Creatinine, Ser: 1.65 mg/dL — ABNORMAL HIGH (ref 0.57–1.00)
GFR calc Af Amer: 35 mL/min/{1.73_m2} — ABNORMAL LOW (ref >59)
GFR calc non Af Amer: 31 mL/min/{1.73_m2} — ABNORMAL LOW (ref >59)
Globulin, Total: 2.7 g/dL (ref 1.5–4.5)
Glucose: 96 mg/dL (ref 65–99)
Potassium: 4.8 mmol/L (ref 3.5–5.2)
Sodium: 140 mmol/L (ref 134–144)
Total Protein: 6.6 g/dL (ref 6.0–8.5)

## 2020-10-13 LAB — RHEUMATOID FACTOR: Rheumatoid fact SerPl-aCnc: <10 IU/mL (ref <14.0)

## 2020-10-13 LAB — CBC WITH DIFFERENTIAL/PLATELET
Basophils Absolute: 0.1 10*3/uL (ref 0.0–0.2)
Basos: 1 %
EOS (ABSOLUTE): 0.2 10*3/uL (ref 0.0–0.4)
Eos: 2 %
Hematocrit: 33.9 % — ABNORMAL LOW (ref 34.0–46.6)
Hemoglobin: 11.6 g/dL (ref 11.1–15.9)
Immature Grans (Abs): 0.1 10*3/uL (ref 0.0–0.1)
Immature Granulocytes: 1 %
Lymphocytes Absolute: 1.6 10*3/uL (ref 0.7–3.1)
Lymphs: 13 %
MCH: 35.4 pg — ABNORMAL HIGH (ref 26.6–33.0)
MCHC: 34.2 g/dL (ref 31.5–35.7)
MCV: 103 fL — ABNORMAL HIGH (ref 79–97)
Monocytes Absolute: 1.3 10*3/uL — ABNORMAL HIGH (ref 0.1–0.9)
Monocytes: 10 %
Neutrophils Absolute: 9.2 10*3/uL — ABNORMAL HIGH (ref 1.4–7.0)
Neutrophils: 73 %
Platelets: 395 10*3/uL (ref 150–450)
RBC: 3.28 x10E6/uL — ABNORMAL LOW (ref 3.77–5.28)
RDW: 11.8 % (ref 11.7–15.4)
WBC: 12.5 10*3/uL — ABNORMAL HIGH (ref 3.4–10.8)

## 2020-10-13 LAB — ANA: Anti Nuclear Antibody (ANA): NEGATIVE

## 2020-10-13 LAB — C-REACTIVE PROTEIN: CRP: 53 mg/L — ABNORMAL HIGH (ref 0–10)

## 2020-10-13 LAB — SEDIMENTATION RATE: Sed Rate: 63 mm/hr — ABNORMAL HIGH (ref 0–40)

## 2020-10-13 LAB — URIC ACID: Uric Acid: 10.5 mg/dL — ABNORMAL HIGH (ref 3.1–7.9)

## 2020-10-13 NOTE — Telephone Encounter (Signed)
-----   Message from Garrel Ridgel, [place] sent at 10/13/2020 12:00 PM EST ----- Please contact the patient and let her know that her uric acid is pretty high.  Due to her heart transplant and all the anti rejection drugs I would recommend that she go to a rheumatologist at [facility].  She should let her heart doc know that she needs to be on a uric acid reducer. We will wait on the ANA.

## 2020-10-13 NOTE — Telephone Encounter (Signed)
Attempted to call the patient regarding lab results - no answer and voicemail box is full. Will try again later.

## 2020-10-15 NOTE — Telephone Encounter (Incomplete)
Contacted patient-   Informed patient of results.

## 2020-10-21 ENCOUNTER — Other Ambulatory Visit: Payer: Self-pay | Admitting: *Deleted

## 2020-10-21 ENCOUNTER — Telehealth: Payer: Self-pay | Admitting: Podiatry

## 2020-10-21 DIAGNOSIS — E790 Hyperuricemia without signs of inflammatory arthritis and tophaceous disease: Secondary | ICD-10-CM

## 2020-10-21 DIAGNOSIS — M109 Gout, unspecified: Secondary | ICD-10-CM

## 2020-10-21 NOTE — Telephone Encounter (Signed)
Per Dr. Milinda Pointer -   Will refer to Dr. Berna Bue

## 2020-10-21 NOTE — Telephone Encounter (Signed)
Pt would like a referral to a Rheumatologist in [place], she does not want to go to [facility] for that referral. Patient asked could Caryl Pina give her a call

## 2020-10-26 ENCOUNTER — Other Ambulatory Visit: Payer: Self-pay | Admitting: Neurology

## 2020-10-28 ENCOUNTER — Ambulatory Visit: Payer: PRIVATE HEALTH INSURANCE | Admitting: Podiatry

## 2020-10-29 ENCOUNTER — Ambulatory Visit (INDEPENDENT_AMBULATORY_CARE_PROVIDER_SITE_OTHER): Payer: Medicare Other | Admitting: *Deleted

## 2020-10-29 ENCOUNTER — Other Ambulatory Visit: Payer: Self-pay

## 2020-10-29 DIAGNOSIS — B351 Tinea unguium: Secondary | ICD-10-CM

## 2020-10-29 DIAGNOSIS — L603 Nail dystrophy: Secondary | ICD-10-CM

## 2020-10-29 NOTE — Progress Notes (Signed)
Patient presents today for the 5th laser treatment. Diagnosed with mycotic nail infection by Dr. Milinda Pointer.   Toenail most affected hallux nail right.    She has had laser treatment in the past as well.   All other systems are negative.  Nails were filed thin. Laser therapy was administered to 1st toenails right and patient tolerated the treatment well. All safety precautions were in place.    Follow up in 8 weeks for laser # 6.   ~Take final nail pic next visit~

## 2020-11-04 IMAGING — CT CT CERVICAL SPINE W/O CM
2 series · 10 of 14 positions shown, 12 images · non-contrast
Comparison: Radiography 09/05/2018

CLINICAL DATA: Chronic neck pain

EXAM:
CT CERVICAL SPINE WITHOUT CONTRAST
TECHNIQUE: Multidetector CT imaging of the cervical spine was performed without
intravenous contrast. Multiplanar CT image reconstructions were also
generated.

[Series 3: cspine soft · axial · 0.26mm/px · z∈[-147,-47]mm · 5 of 76 slices shown]
[im 13/76  soft-tissue]
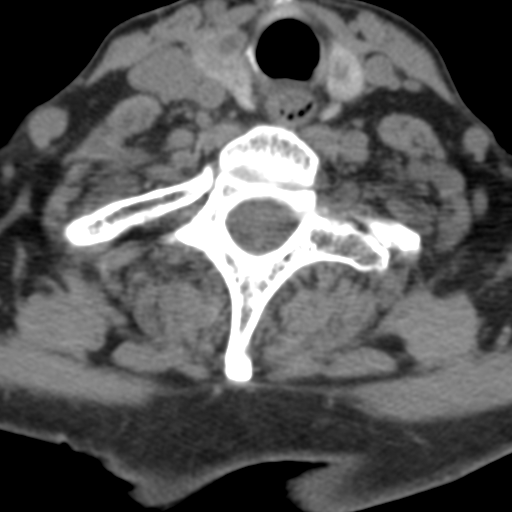
[im 26/76  soft-tissue]
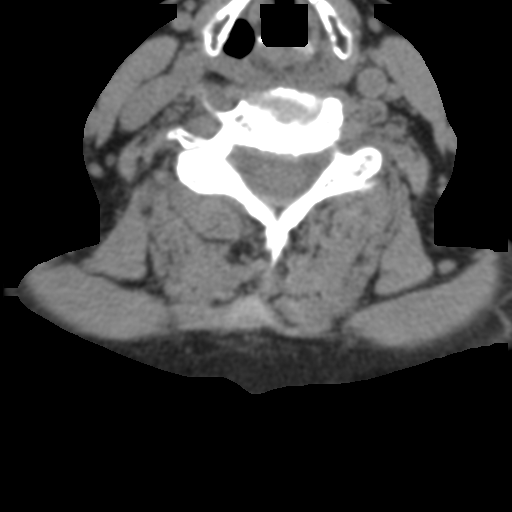
[im 38/76  soft-tissue]
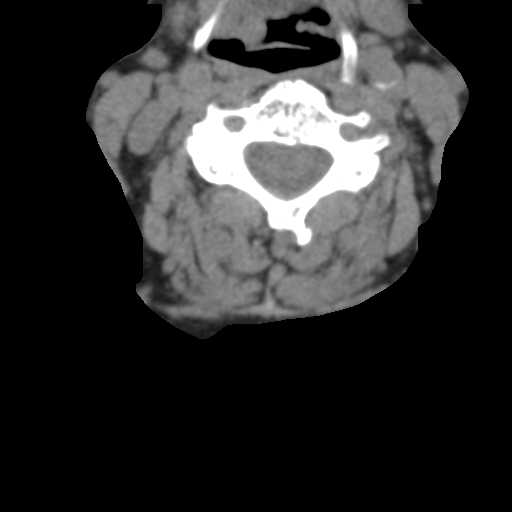
[im 51/76  soft-tissue]
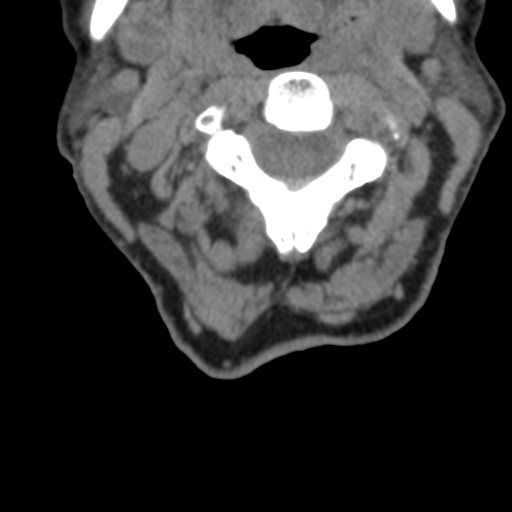
[im 63/76  soft-tissue]
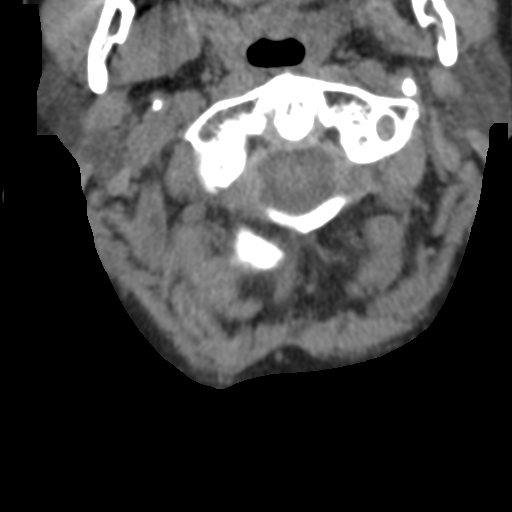

[Series 9: angled axial · axial · 0.26mm/px · z∈[-152,-54]mm · 5 of 75 slices shown, 7 images]
[im 13/75  soft-tissue]
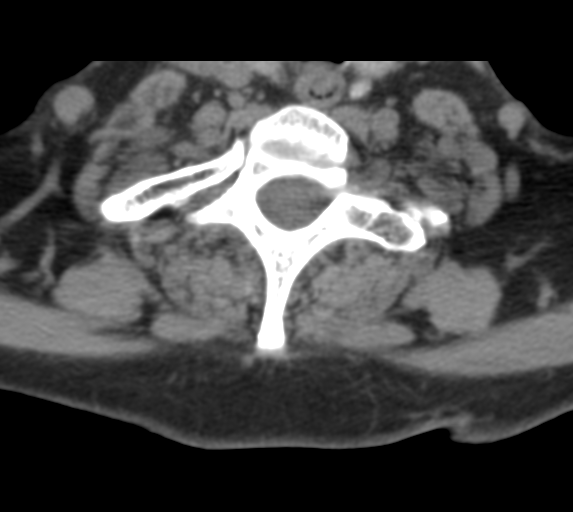
[im 13/75  bone]
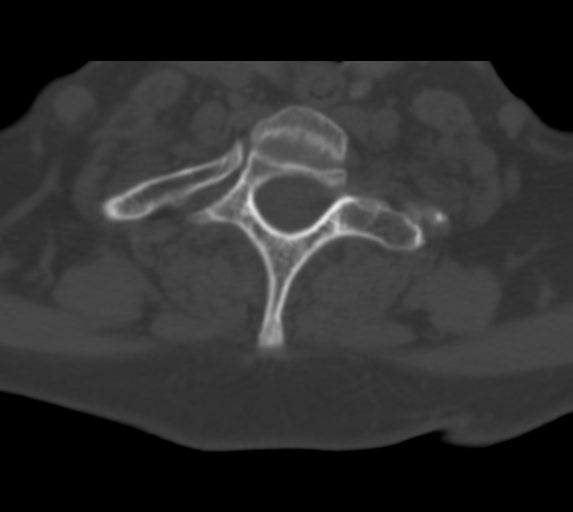
[im 25/75  bone]
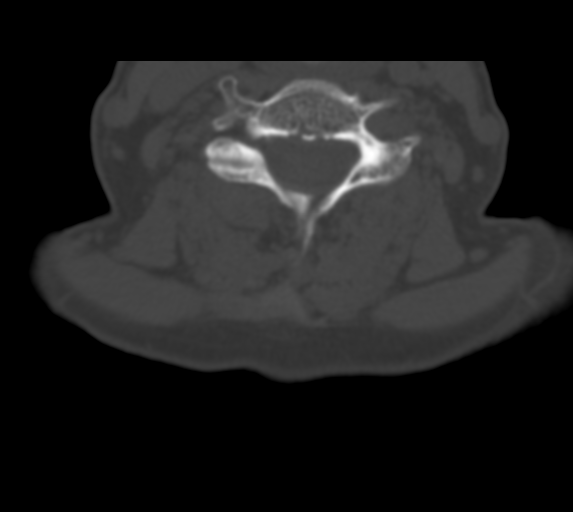
[im 38/75  bone]
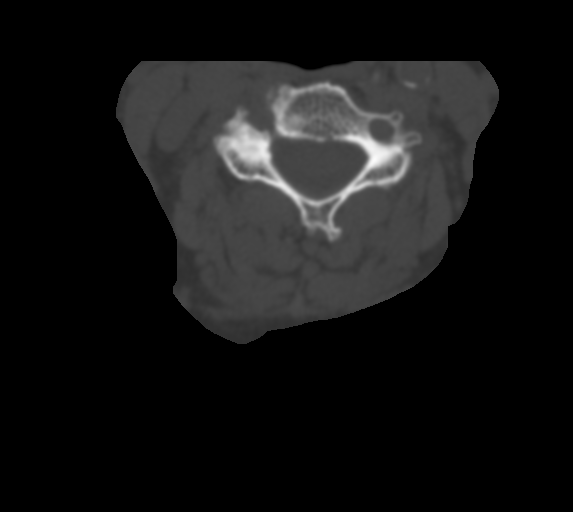
[im 50/75  bone]
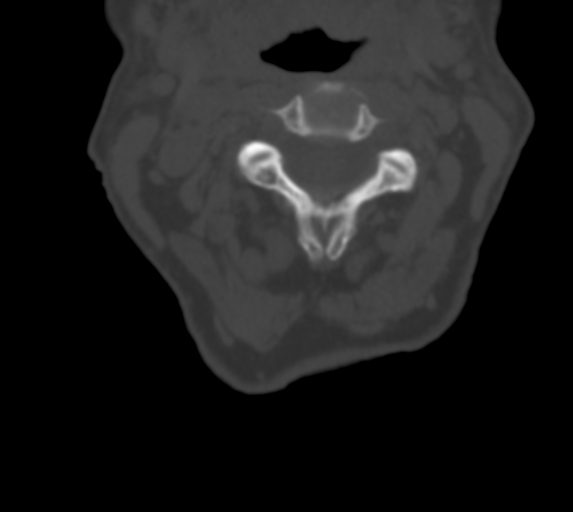
[im 62/75  soft-tissue]
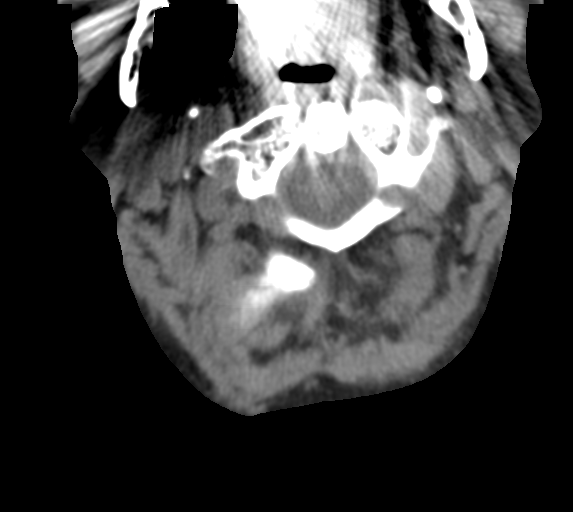
[im 62/75  bone]
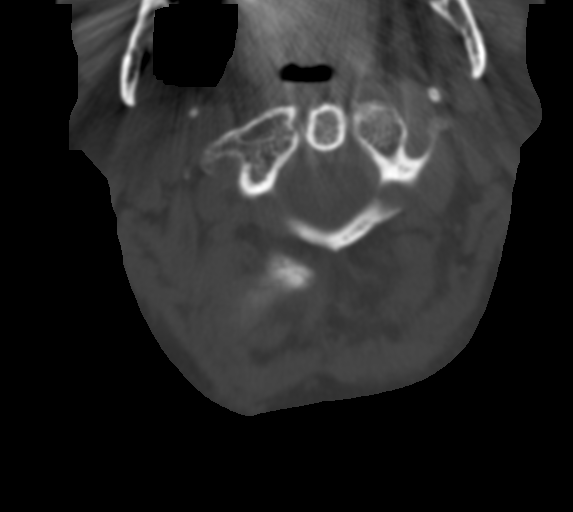

[10 of 14 positions shown; findings below may reference images not displayed]

FINDINGS: Alignment: Mild cervical curvature convex to the left.

Skull base and vertebrae: No fracture or primary bone lesion.

Soft tissues and spinal canal: Calcification at the carotid
bifurcations incidentally noted. Heterogeneous enlarged thyroid
gland.

Disc levels: Foramen magnum widely patent. Ordinary minimal
osteoarthritis at the C1-2 articulation.

C2-3: Normal interspace.

C3-4: Facet osteoarthritis on the right. Mild bony foraminal
narrowing on the right.

C4-5: Bilateral uncovertebral hypertrophy right more than left. Mild
bony foraminal narrowing on the right.

C5-6: Spondylosis with endplate osteophytes. Uncovertebral
hypertrophy left more than right. Mild bony foraminal narrowing on
the right. Moderate bony foraminal narrowing on the left.

C6-7: Spondylosis with endplate osteophytes. Uncovertebral
hypertrophy on both sides. Mild bilateral bony foraminal narrowing.

C7-T1: Normal interspace.

Upper chest: Pleural and parenchymal scarring.

Other: None
IMPRESSION: Mild curvature convex to the left. Mild spondylosis and facet
arthritis for a person of this age. Mild bony foraminal narrowing
from C3-4 through C6-7 as described above.

## 2020-11-05 NOTE — Telephone Encounter (Signed)
Referral information submitted to Dr. Berna Bue yesterday - their office will be calling to set this up with her - patient aware

## 2020-11-09 DIAGNOSIS — Z950 Presence of cardiac pacemaker: Secondary | ICD-10-CM | POA: Diagnosis not present

## 2020-11-09 DIAGNOSIS — Z45018 Encounter for adjustment and management of other part of cardiac pacemaker: Secondary | ICD-10-CM | POA: Diagnosis not present

## 2020-11-23 DIAGNOSIS — D1801 Hemangioma of skin and subcutaneous tissue: Secondary | ICD-10-CM | POA: Diagnosis not present

## 2020-11-23 DIAGNOSIS — Z85828 Personal history of other malignant neoplasm of skin: Secondary | ICD-10-CM | POA: Diagnosis not present

## 2020-11-23 DIAGNOSIS — D1800 Hemangioma unspecified site: Secondary | ICD-10-CM | POA: Diagnosis not present

## 2020-11-23 DIAGNOSIS — D045 Carcinoma in situ of skin of trunk: Secondary | ICD-10-CM | POA: Diagnosis not present

## 2020-11-23 DIAGNOSIS — L814 Other melanin hyperpigmentation: Secondary | ICD-10-CM | POA: Diagnosis not present

## 2020-11-23 DIAGNOSIS — D485 Neoplasm of uncertain behavior of skin: Secondary | ICD-10-CM | POA: Diagnosis not present

## 2020-11-23 DIAGNOSIS — D849 Immunodeficiency, unspecified: Secondary | ICD-10-CM | POA: Diagnosis not present

## 2020-11-23 DIAGNOSIS — L570 Actinic keratosis: Secondary | ICD-10-CM | POA: Diagnosis not present

## 2020-11-23 DIAGNOSIS — D225 Melanocytic nevi of trunk: Secondary | ICD-10-CM | POA: Diagnosis not present

## 2020-11-23 DIAGNOSIS — L821 Other seborrheic keratosis: Secondary | ICD-10-CM | POA: Diagnosis not present

## 2020-11-23 DIAGNOSIS — L578 Other skin changes due to chronic exposure to nonionizing radiation: Secondary | ICD-10-CM | POA: Diagnosis not present

## 2020-11-23 DIAGNOSIS — L910 Hypertrophic scar: Secondary | ICD-10-CM | POA: Diagnosis not present

## 2020-11-23 DIAGNOSIS — D2260 Melanocytic nevi of unspecified upper limb, including shoulder: Secondary | ICD-10-CM | POA: Diagnosis not present

## 2020-11-23 DIAGNOSIS — Q828 Other specified congenital malformations of skin: Secondary | ICD-10-CM | POA: Diagnosis not present

## 2020-12-07 DIAGNOSIS — M2550 Pain in unspecified joint: Secondary | ICD-10-CM | POA: Diagnosis not present

## 2020-12-07 DIAGNOSIS — M150 Primary generalized (osteo)arthritis: Secondary | ICD-10-CM | POA: Diagnosis not present

## 2020-12-07 DIAGNOSIS — M1009 Idiopathic gout, multiple sites: Secondary | ICD-10-CM | POA: Diagnosis not present

## 2020-12-07 DIAGNOSIS — Z6820 Body mass index (BMI) 20.0-20.9, adult: Secondary | ICD-10-CM | POA: Diagnosis not present

## 2020-12-09 DIAGNOSIS — H90A31 Mixed conductive and sensorineural hearing loss, unilateral, right ear with restricted hearing on the contralateral side: Secondary | ICD-10-CM | POA: Diagnosis not present

## 2020-12-09 DIAGNOSIS — R682 Dry mouth, unspecified: Secondary | ICD-10-CM | POA: Diagnosis not present

## 2020-12-09 DIAGNOSIS — H903 Sensorineural hearing loss, bilateral: Secondary | ICD-10-CM | POA: Diagnosis not present

## 2020-12-10 DIAGNOSIS — Z48298 Encounter for aftercare following other organ transplant: Secondary | ICD-10-CM | POA: Diagnosis not present

## 2020-12-10 DIAGNOSIS — Z941 Heart transplant status: Secondary | ICD-10-CM | POA: Diagnosis not present

## 2020-12-10 DIAGNOSIS — N390 Urinary tract infection, site not specified: Secondary | ICD-10-CM | POA: Diagnosis not present

## 2020-12-10 DIAGNOSIS — D849 Immunodeficiency, unspecified: Secondary | ICD-10-CM | POA: Diagnosis not present

## 2020-12-17 DIAGNOSIS — Z79899 Other long term (current) drug therapy: Secondary | ICD-10-CM | POA: Diagnosis not present

## 2020-12-17 DIAGNOSIS — Z298 Encounter for other specified prophylactic measures: Secondary | ICD-10-CM | POA: Diagnosis not present

## 2020-12-17 DIAGNOSIS — D84821 Immunodeficiency due to drugs: Secondary | ICD-10-CM | POA: Diagnosis not present

## 2020-12-17 DIAGNOSIS — D849 Immunodeficiency, unspecified: Secondary | ICD-10-CM | POA: Diagnosis not present

## 2020-12-17 DIAGNOSIS — Z950 Presence of cardiac pacemaker: Secondary | ICD-10-CM | POA: Diagnosis not present

## 2020-12-17 DIAGNOSIS — Z23 Encounter for immunization: Secondary | ICD-10-CM | POA: Diagnosis not present

## 2020-12-17 DIAGNOSIS — I071 Rheumatic tricuspid insufficiency: Secondary | ICD-10-CM | POA: Diagnosis not present

## 2020-12-17 DIAGNOSIS — Z941 Heart transplant status: Secondary | ICD-10-CM | POA: Diagnosis not present

## 2020-12-17 DIAGNOSIS — N390 Urinary tract infection, site not specified: Secondary | ICD-10-CM | POA: Diagnosis not present

## 2020-12-17 DIAGNOSIS — Z4821 Encounter for aftercare following heart transplant: Secondary | ICD-10-CM | POA: Diagnosis not present

## 2020-12-17 DIAGNOSIS — G629 Polyneuropathy, unspecified: Secondary | ICD-10-CM | POA: Diagnosis not present

## 2020-12-17 DIAGNOSIS — M109 Gout, unspecified: Secondary | ICD-10-CM | POA: Diagnosis not present

## 2020-12-17 DIAGNOSIS — Z48298 Encounter for aftercare following other organ transplant: Secondary | ICD-10-CM | POA: Diagnosis not present

## 2020-12-24 ENCOUNTER — Other Ambulatory Visit: Payer: Medicare Other

## 2021-01-04 DIAGNOSIS — M1009 Idiopathic gout, multiple sites: Secondary | ICD-10-CM | POA: Diagnosis not present

## 2021-01-10 DIAGNOSIS — Z950 Presence of cardiac pacemaker: Secondary | ICD-10-CM | POA: Diagnosis not present

## 2021-01-10 DIAGNOSIS — Z45018 Encounter for adjustment and management of other part of cardiac pacemaker: Secondary | ICD-10-CM | POA: Diagnosis not present

## 2021-02-01 DIAGNOSIS — S20212A Contusion of left front wall of thorax, initial encounter: Secondary | ICD-10-CM | POA: Diagnosis not present

## 2021-02-01 DIAGNOSIS — M810 Age-related osteoporosis without current pathological fracture: Secondary | ICD-10-CM | POA: Diagnosis not present

## 2021-02-08 DIAGNOSIS — M810 Age-related osteoporosis without current pathological fracture: Secondary | ICD-10-CM | POA: Diagnosis not present

## 2021-02-08 DIAGNOSIS — E559 Vitamin D deficiency, unspecified: Secondary | ICD-10-CM | POA: Diagnosis not present

## 2021-02-09 DIAGNOSIS — M2550 Pain in unspecified joint: Secondary | ICD-10-CM | POA: Diagnosis not present

## 2021-02-09 DIAGNOSIS — M150 Primary generalized (osteo)arthritis: Secondary | ICD-10-CM | POA: Diagnosis not present

## 2021-02-09 DIAGNOSIS — M1009 Idiopathic gout, multiple sites: Secondary | ICD-10-CM | POA: Diagnosis not present

## 2021-02-09 DIAGNOSIS — Z6821 Body mass index (BMI) 21.0-21.9, adult: Secondary | ICD-10-CM | POA: Diagnosis not present

## 2021-03-01 DIAGNOSIS — L565 Disseminated superficial actinic porokeratosis (DSAP): Secondary | ICD-10-CM | POA: Diagnosis not present

## 2021-03-01 DIAGNOSIS — D2260 Melanocytic nevi of unspecified upper limb, including shoulder: Secondary | ICD-10-CM | POA: Diagnosis not present

## 2021-03-01 DIAGNOSIS — L821 Other seborrheic keratosis: Secondary | ICD-10-CM | POA: Diagnosis not present

## 2021-03-01 DIAGNOSIS — D485 Neoplasm of uncertain behavior of skin: Secondary | ICD-10-CM | POA: Diagnosis not present

## 2021-03-01 DIAGNOSIS — L814 Other melanin hyperpigmentation: Secondary | ICD-10-CM | POA: Diagnosis not present

## 2021-03-01 DIAGNOSIS — D1801 Hemangioma of skin and subcutaneous tissue: Secondary | ICD-10-CM | POA: Diagnosis not present

## 2021-03-01 DIAGNOSIS — L578 Other skin changes due to chronic exposure to nonionizing radiation: Secondary | ICD-10-CM | POA: Diagnosis not present

## 2021-03-01 DIAGNOSIS — D849 Immunodeficiency, unspecified: Secondary | ICD-10-CM | POA: Diagnosis not present

## 2021-03-01 DIAGNOSIS — Z85828 Personal history of other malignant neoplasm of skin: Secondary | ICD-10-CM | POA: Diagnosis not present

## 2021-03-01 DIAGNOSIS — D0472 Carcinoma in situ of skin of left lower limb, including hip: Secondary | ICD-10-CM | POA: Diagnosis not present

## 2021-03-01 DIAGNOSIS — C44529 Squamous cell carcinoma of skin of other part of trunk: Secondary | ICD-10-CM | POA: Diagnosis not present

## 2021-03-01 DIAGNOSIS — L570 Actinic keratosis: Secondary | ICD-10-CM | POA: Diagnosis not present

## 2021-03-01 DIAGNOSIS — C4442 Squamous cell carcinoma of skin of scalp and neck: Secondary | ICD-10-CM | POA: Diagnosis not present

## 2021-03-02 DIAGNOSIS — C4442 Squamous cell carcinoma of skin of scalp and neck: Secondary | ICD-10-CM | POA: Diagnosis not present

## 2021-03-02 DIAGNOSIS — C44529 Squamous cell carcinoma of skin of other part of trunk: Secondary | ICD-10-CM | POA: Diagnosis not present

## 2021-03-07 ENCOUNTER — Ambulatory Visit: Payer: Medicare Other | Admitting: Neurology

## 2021-03-07 ENCOUNTER — Encounter: Payer: Self-pay | Admitting: Neurology

## 2021-03-07 NOTE — Progress Notes (Deleted)
PATIENT: Cassie Harvey DOB: Apr 21, 1946  REASON FOR VISIT: follow up HISTORY FROM: patient  HISTORY OF PRESENT ILLNESS: Today 03/07/21 Cassie Harvey   Update 09/06/2020 SS: Verlon Carcione is a 75 year old female with history of polio affecting the left leg, 18-year history of burning dysesthesias affecting the legs bilaterally, right more than the left.  She is followed through [facility] post heart transplant.  Sees Dr. Ellene Route for chronic low back pain (had back injections almost 2 years, just starting to wear off).  Has complained of neck discomfort, dizziness, posterior headache, no longer has this with Cymbalta 30 mg at bedtime. EMG/NCV in 2015 did not show clear evidence of a true peripheral neuropathy.  Doing well on Cymbalta, has not tolerated gabapentin or Lyrica.  CT cervical spine showed mild degenerative changes, CT head was unremarkable.  Of note is noted, continues to report burning to the right leg worse at night, keeps her awake, she takes Tylenol, falls asleep.  Walking with a cane.  Needs a left knee replacement, has been putting off. Healing right wrist fracture, wearing brace. Is on several vitamins.  Here today for follow-up unaccompanied.  HISTORY 03/02/2020 Dr. Jannifer Franklin: Ms. Willaims is a 75 year old right-handed white female with a history of polio affecting primarily the left leg.  The patient has had an almost 18-year history of some burning dysesthesias affecting the legs bilaterally, right greater than left going from the thigh level all the way down to the feet.  The symptoms are worse at nighttime, she does not have much problem during the daytime.  She has weakness of the left leg that has been persistent throughout her life.  She has had a right total knee replacement.  She has severe arthritis of the left knee and may be considering surgery in the near future.  She is followed through [facility] following a heart transplant.  She does report some chronic low back pain and is  followed by Dr. Ellene Route.  She has over the last year developed some discomfort in the back of the neck bilaterally as she describes as an ice cream type headache.  The patient may have some dizziness off and on associated with this.  The patient reports no discomfort going down the arms on either side.  The patient has a gait disorder, she uses a cane for ambulation.  She has not had any recent falls.  She comes to this office for further evaluation.   REVIEW OF SYSTEMS: Out of a complete 14 system review of symptoms, the patient complains only of the following symptoms, and all other reviewed systems are negative.  Burning, back pain  ALLERGIES: Allergies  Allergen Reactions   Oxybutynin Shortness Of Breath   Bacitracin Swelling   Lisinopril Cough   Neomycin Swelling   Sulfa Antibiotics Rash   Sulfabenzamide Rash    HOME MEDICATIONS: Outpatient Medications Prior to Visit  Medication Sig Dispense Refill   Ascorbic Acid (VITAMIN C PO) Take 500 mg by mouth in the morning, at noon, and at bedtime.     ASPIRIN 81 PO Take by mouth.     B Complex-C (SUPER B COMPLEX PO) Take by mouth.     calcium-vitamin D (OSCAL WITH D) 500-200 MG-UNIT per tablet Take 1 tablet by mouth 2 (two) times daily.     carvedilol (COREG) 3.125 MG tablet Take 3.125 mg by mouth 2 (two) times daily with a meal.     cycloSPORINE modified (NEORAL) 25 MG capsule Take 25  mg by mouth 2 (two) times daily. Patient takes 75 in the am and 75 at 10pm     DULoxetine (CYMBALTA) 30 MG capsule Take 1 capsule (30 mg total) by mouth 2 (two) times daily. 60 capsule 5   ezetimibe (ZETIA) 10 MG tablet every evening.   7   folic acid (FOLVITE) 1 MG tablet daily.   10   methylPREDNISolone (MEDROL DOSEPAK) 4 MG TBPK tablet 6 day dose pack - take as directed 21 tablet 0   Multiple Vitamin (MULTIVITAMIN WITH MINERALS) TABS tablet Take 1 tablet by mouth daily.     mycophenolate (CELLCEPT) 500 MG tablet Take 500 mg by mouth 2 (two) times daily.      niacin (NIASPAN) 500 MG CR tablet Take 500 mg by mouth at bedtime.     Omega-3 Fatty Acids (FISH OIL PO) Take 1,000 mg by mouth 3 (three) times daily.      ondansetron (ZOFRAN) 8 MG tablet Take by mouth every 8 (eight) hours as needed for nausea.     solifenacin (VESICARE) 10 MG tablet Take by mouth daily.     UNABLE TO FIND metagenics-ultraflora IB 60BN CFu once a day     valACYclovir (VALTREX) 500 MG tablet Take 500 mg by mouth daily.     VITAMIN D PO Take 2,000 Units by mouth 3 (three) times daily.     No facility-administered medications prior to visit.    PAST MEDICAL HISTORY: Past Medical History:  Diagnosis Date   Broken shoulder    right   Chronic pain    DDD (degenerative disc disease), lumbar    DJD (degenerative joint disease)    Family history of anesthesia complication    My father suffered from a dysrythmia   Heart transplanted [facility]) 2004   History of poliomyelitis 10/15/2013   Hypertension    IBS (irritable bowel syndrome)    Immunosuppression ([place])    Molar pregnancy    Tx; with chemo   Mouth ulcers    Neuropathy, peripheral    Pacemaker 01/13/2003   Pneumonia    PONV (postoperative nausea and vomiting)    Urinary frequency    Wrist fracture, bilateral    Surgical repair left    PAST SURGICAL HISTORY: Past Surgical History:  Procedure Laterality Date   APPENDECTOMY     CARDIAC CATHETERIZATION  2003   2015   COLONOSCOPY     COLONOSCOPY N/A 01/12/2014   Procedure: COLONOSCOPY;  Surgeon: Juanita Craver, MD;  Location: [facility];  Service: Endoscopy;  Laterality: N/A;   DILATION AND CURETTAGE OF UTERUS  1988   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   EAR EXAMINATION UNDER ANESTHESIA  1977   lt middle ear    ELBOW ARTHROPLASTY     rt   HEART TRANSPLANT  11/2002   INJECTION KNEE Left 01/16/2013   Procedure: KNEE INJECTION;  Surgeon: [name], MD;  Location: [place];  Service: Orthopedics;  Laterality: Left;  STEROID    KNEE  ARTHROSCOPY  1998   rt   KNEE ARTHROSCOPY WITH MEDIAL MENISECTOMY Right 01/16/2013   Procedure: RIGHT KNEE ARTHROSCOPY WITH MEDIAL AND LATERAL MENISECTOMY, REMOVAL LOOSE FOREIGN BODY AND STEROID INJECTION;  Surgeon: [name], MD;  Location: [place];  Service: Orthopedics;  Laterality: Right;  DEPRIEDMENT LATERAL MENISCUS AND CHONDROPLASTY   PACEMAKER INSERTION  7619,5093   new dual chamber -medtronic 5076-art/vent leads   SEPTOPLASTY  1992   SHOULDER ARTHROSCOPY  96,02  right abd left   STERIOD INJECTION Left 01/16/2013   Procedure: STEROID INJECTION;  Surgeon: [name], MD;  Location: [place];  Service: Orthopedics;  Laterality: Left;   TOTAL HIP ARTHROPLASTY  02/2011   rt   TOTAL KNEE ARTHROPLASTY Right 02/25/2014   Procedure: RIGHT TOTAL KNEE ARTHROPLASTY;  Surgeon: [name], MD;  Location: [place];  Service: Orthopedics;  Laterality: Right;   TUBAL LIGATION  1990   WRIST ARTHROPLASTY  2008   left    FAMILY HISTORY: Family History  Problem Relation Age of Onset   Pulmonary fibrosis Father     SOCIAL HISTORY: Social History   Socioeconomic History   Marital status: Married    Spouse name: Not on file   Number of children: 0   Years of education: college   Highest education level: Not on file  Occupational History   Occupation: Retired  Tobacco Use   Smoking status: Never   Smokeless tobacco: Never  Substance and Sexual Activity   Alcohol use: No   Drug use: No   Sexual activity: Not on file  Other Topics Concern   Not on file  Social History Narrative   Not on file   Social Determinants of Health   Financial Resource Strain: Not on file  Food Insecurity: Not on file  Transportation Needs: Not on file  Physical Activity: Not on file  Stress: Not on file  Social Connections: Not on file  Intimate Partner Violence: Not on file   PHYSICAL EXAM  There were no vitals filed for this visit.  There is no height  or weight on file to calculate BMI.  Generalized: Well developed, in no acute distress   Neurological examination  Mentation: Alert oriented to time, place, history taking. Follows all commands speech and language fluent Cranial nerve II-XII: Pupils were equal round reactive to light. Extraocular movements were full, visual field were full on confrontational test. Facial sensation and strength were normal. Head turning and shoulder shrug were normal and symmetric. Motor: 5/5 strength of all extremities, exception left leg 4/5 with hip flexion, 2/5 knee extension, wearing knee brace left Sensory: Sensory testing is intact to soft touch on all 4 extremities. No evidence of extinction is noted.  Coordination: Cerebellar testing reveals good finger-nose-finger bilaterally Gait and station: Gait is wide-based, walks with a cane Reflexes: Deep tendon reflexes are symmetric but depressed throughout  DIAGNOSTIC DATA (LABS, IMAGING, TESTING) - I reviewed patient records, labs, notes, testing and imaging myself where available.  Lab Results  Component Value Date   WBC 12.5 (H) 10/12/2020   HGB 11.6 10/12/2020   HCT 33.9 (L) 10/12/2020   MCV 103 (H) 10/12/2020   PLT 395 10/12/2020      Component Value Date/Time   NA 140 10/12/2020 1346   K 4.8 10/12/2020 1346   CL 102 10/12/2020 1346   CO2 20 10/12/2020 1346   GLUCOSE 96 10/12/2020 1346   GLUCOSE 114 (H) 02/26/2014 0535   BUN 28 (H) 10/12/2020 1346   CREATININE 1.65 (H) 10/12/2020 1346   CALCIUM 9.5 10/12/2020 1346   PROT 6.6 10/12/2020 1346   ALBUMIN 3.9 10/12/2020 1346   AST 18 10/12/2020 1346   ALT 7 10/12/2020 1346   ALKPHOS 51 10/12/2020 1346   BILITOT 0.3 10/12/2020 1346   GFRNONAA 31 (L) 10/12/2020 1346   GFRAA 35 (L) 10/12/2020 1346   No results found for: CHOL, HDL, LDLCALC, LDLDIRECT, TRIG, CHOLHDL No results found for:  HGBA1C No results found for: VITAMINB12 Lab Results  Component Value Date   TSH 2.530 10/16/2013       ASSESSMENT AND PLAN 75 y.o. year old female  has a past medical history of Broken shoulder, Chronic pain, DDD (degenerative disc disease), lumbar, DJD (degenerative joint disease), Family history of anesthesia complication, Heart transplanted ([place]) (2004), History of poliomyelitis (10/15/2013), Hypertension, IBS (irritable bowel syndrome), Immunosuppression ([place]), Molar pregnancy, Mouth ulcers, Neuropathy, peripheral, Pacemaker (01/13/2003), Pneumonia, PONV (postoperative nausea and vomiting), Urinary frequency, and Wrist fracture, bilateral. here with:  1.  History of polio affecting primarily the left leg 2.  Chronic dysesthesias, lower extremities 3.  Gait disorder 4.  Neck discomfort, posterior headache, dizziness -Benefited and tolerated Cymbalta, no longer has neck pain/headache  -Increase Cymbalta 30 mg twice a day, to help with burning mostly to the right leg at night (prefers twice daily vs 60 mg at bedtime) -Will continue seeing Dr. 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  This included previsit chart review, lab review, study review, order entry, electronic health record documentation, patient education.    Butler Denmark, AGNP-C, DNP 03/07/2021, 5:42 AM [facility] [address], [place], [place] [address] [phone]

## 2021-03-09 DIAGNOSIS — Z79899 Other long term (current) drug therapy: Secondary | ICD-10-CM | POA: Diagnosis not present

## 2021-03-09 DIAGNOSIS — M1009 Idiopathic gout, multiple sites: Secondary | ICD-10-CM | POA: Diagnosis not present

## 2021-04-07 DIAGNOSIS — C44529 Squamous cell carcinoma of skin of other part of trunk: Secondary | ICD-10-CM | POA: Diagnosis not present

## 2021-05-06 ENCOUNTER — Other Ambulatory Visit: Payer: Self-pay

## 2021-05-06 ENCOUNTER — Ambulatory Visit (INDEPENDENT_AMBULATORY_CARE_PROVIDER_SITE_OTHER): Payer: Medicare Other | Admitting: *Deleted

## 2021-05-06 DIAGNOSIS — L603 Nail dystrophy: Secondary | ICD-10-CM

## 2021-05-06 DIAGNOSIS — B351 Tinea unguium: Secondary | ICD-10-CM

## 2021-05-06 NOTE — Progress Notes (Signed)
Patient presents today for the 6th laser treatment. Diagnosed with mycotic nail infection by Dr. Milinda Pointer.   Toenail most affected hallux nail right. Her nail has almost grown completely out. Slight discoloration at the very tip.  She has had laser treatment in the past as well.   All other systems are negative.  Nails were filed thin. Laser therapy was administered to 1st toenails right and patient tolerated the treatment well. All safety precautions were in place.    Patient has completed the recommended laser treatments. He will follow up with Dr. Milinda Pointer in 3 months to evaluate progress.     ~Final pic taken today~

## 2021-05-19 DIAGNOSIS — I495 Sick sinus syndrome: Secondary | ICD-10-CM | POA: Diagnosis not present

## 2021-05-19 DIAGNOSIS — R42 Dizziness and giddiness: Secondary | ICD-10-CM | POA: Diagnosis not present

## 2021-05-19 DIAGNOSIS — Z941 Heart transplant status: Secondary | ICD-10-CM | POA: Diagnosis not present

## 2021-05-19 DIAGNOSIS — G8929 Other chronic pain: Secondary | ICD-10-CM | POA: Diagnosis not present

## 2021-05-19 DIAGNOSIS — Z950 Presence of cardiac pacemaker: Secondary | ICD-10-CM | POA: Diagnosis not present

## 2021-05-19 DIAGNOSIS — R059 Cough, unspecified: Secondary | ICD-10-CM | POA: Diagnosis not present

## 2021-05-19 DIAGNOSIS — I472 Ventricular tachycardia: Secondary | ICD-10-CM | POA: Diagnosis not present

## 2021-05-19 DIAGNOSIS — Z45018 Encounter for adjustment and management of other part of cardiac pacemaker: Secondary | ICD-10-CM | POA: Diagnosis not present

## 2021-05-19 DIAGNOSIS — M545 Low back pain, unspecified: Secondary | ICD-10-CM | POA: Diagnosis not present

## 2021-05-25 DIAGNOSIS — Z941 Heart transplant status: Secondary | ICD-10-CM | POA: Diagnosis not present

## 2021-05-25 DIAGNOSIS — M109 Gout, unspecified: Secondary | ICD-10-CM | POA: Diagnosis not present

## 2021-05-25 DIAGNOSIS — N189 Chronic kidney disease, unspecified: Secondary | ICD-10-CM | POA: Diagnosis not present

## 2021-05-25 DIAGNOSIS — D849 Immunodeficiency, unspecified: Secondary | ICD-10-CM | POA: Diagnosis not present

## 2021-05-25 DIAGNOSIS — R053 Chronic cough: Secondary | ICD-10-CM | POA: Diagnosis not present

## 2021-05-25 DIAGNOSIS — M1990 Unspecified osteoarthritis, unspecified site: Secondary | ICD-10-CM | POA: Diagnosis not present

## 2021-05-25 DIAGNOSIS — G629 Polyneuropathy, unspecified: Secondary | ICD-10-CM | POA: Diagnosis not present

## 2021-05-25 DIAGNOSIS — Z48298 Encounter for aftercare following other organ transplant: Secondary | ICD-10-CM | POA: Diagnosis not present

## 2021-05-25 DIAGNOSIS — R42 Dizziness and giddiness: Secondary | ICD-10-CM | POA: Diagnosis not present

## 2021-05-25 DIAGNOSIS — D84821 Immunodeficiency due to drugs: Secondary | ICD-10-CM | POA: Diagnosis not present

## 2021-05-25 DIAGNOSIS — Z4821 Encounter for aftercare following heart transplant: Secondary | ICD-10-CM | POA: Diagnosis not present

## 2021-05-25 DIAGNOSIS — I130 Hypertensive heart and chronic kidney disease with heart failure and stage 1 through stage 4 chronic kidney disease, or unspecified chronic kidney disease: Secondary | ICD-10-CM | POA: Diagnosis not present

## 2021-05-25 DIAGNOSIS — Z79899 Other long term (current) drug therapy: Secondary | ICD-10-CM | POA: Diagnosis not present

## 2021-05-25 DIAGNOSIS — M1A9XX0 Chronic gout, unspecified, without tophus (tophi): Secondary | ICD-10-CM | POA: Diagnosis not present

## 2021-05-25 DIAGNOSIS — R059 Cough, unspecified: Secondary | ICD-10-CM | POA: Diagnosis not present

## 2021-05-25 DIAGNOSIS — R918 Other nonspecific abnormal finding of lung field: Secondary | ICD-10-CM | POA: Diagnosis not present

## 2021-06-02 DIAGNOSIS — L814 Other melanin hyperpigmentation: Secondary | ICD-10-CM | POA: Diagnosis not present

## 2021-06-02 DIAGNOSIS — D2260 Melanocytic nevi of unspecified upper limb, including shoulder: Secondary | ICD-10-CM | POA: Diagnosis not present

## 2021-06-02 DIAGNOSIS — L659 Nonscarring hair loss, unspecified: Secondary | ICD-10-CM | POA: Diagnosis not present

## 2021-06-02 DIAGNOSIS — Z941 Heart transplant status: Secondary | ICD-10-CM | POA: Diagnosis not present

## 2021-06-02 DIAGNOSIS — Z950 Presence of cardiac pacemaker: Secondary | ICD-10-CM | POA: Diagnosis not present

## 2021-06-02 DIAGNOSIS — D849 Immunodeficiency, unspecified: Secondary | ICD-10-CM | POA: Diagnosis not present

## 2021-06-02 DIAGNOSIS — L565 Disseminated superficial actinic porokeratosis (DSAP): Secondary | ICD-10-CM | POA: Diagnosis not present

## 2021-06-02 DIAGNOSIS — Z85828 Personal history of other malignant neoplasm of skin: Secondary | ICD-10-CM | POA: Diagnosis not present

## 2021-06-02 DIAGNOSIS — D1801 Hemangioma of skin and subcutaneous tissue: Secondary | ICD-10-CM | POA: Diagnosis not present

## 2021-06-02 DIAGNOSIS — Z951 Presence of aortocoronary bypass graft: Secondary | ICD-10-CM | POA: Diagnosis not present

## 2021-06-02 DIAGNOSIS — L910 Hypertrophic scar: Secondary | ICD-10-CM | POA: Diagnosis not present

## 2021-06-02 DIAGNOSIS — L821 Other seborrheic keratosis: Secondary | ICD-10-CM | POA: Diagnosis not present

## 2021-06-02 DIAGNOSIS — L578 Other skin changes due to chronic exposure to nonionizing radiation: Secondary | ICD-10-CM | POA: Diagnosis not present

## 2021-06-02 DIAGNOSIS — Z48298 Encounter for aftercare following other organ transplant: Secondary | ICD-10-CM | POA: Diagnosis not present

## 2021-06-02 DIAGNOSIS — R053 Chronic cough: Secondary | ICD-10-CM | POA: Diagnosis not present

## 2021-06-02 DIAGNOSIS — L570 Actinic keratosis: Secondary | ICD-10-CM | POA: Diagnosis not present

## 2021-06-02 DIAGNOSIS — D2270 Melanocytic nevi of unspecified lower limb, including hip: Secondary | ICD-10-CM | POA: Diagnosis not present

## 2021-06-02 DIAGNOSIS — D225 Melanocytic nevi of trunk: Secondary | ICD-10-CM | POA: Diagnosis not present

## 2021-06-02 DIAGNOSIS — D485 Neoplasm of uncertain behavior of skin: Secondary | ICD-10-CM | POA: Diagnosis not present

## 2021-06-07 DIAGNOSIS — Z941 Heart transplant status: Secondary | ICD-10-CM | POA: Diagnosis not present

## 2021-06-07 DIAGNOSIS — Z9225 Personal history of immunosupression therapy: Secondary | ICD-10-CM | POA: Diagnosis not present

## 2021-06-07 DIAGNOSIS — L659 Nonscarring hair loss, unspecified: Secondary | ICD-10-CM | POA: Diagnosis not present

## 2021-06-07 DIAGNOSIS — Z48298 Encounter for aftercare following other organ transplant: Secondary | ICD-10-CM | POA: Diagnosis not present

## 2021-06-13 DIAGNOSIS — Z48298 Encounter for aftercare following other organ transplant: Secondary | ICD-10-CM | POA: Diagnosis not present

## 2021-06-13 DIAGNOSIS — R911 Solitary pulmonary nodule: Secondary | ICD-10-CM | POA: Diagnosis not present

## 2021-06-13 DIAGNOSIS — D849 Immunodeficiency, unspecified: Secondary | ICD-10-CM | POA: Diagnosis not present

## 2021-06-13 DIAGNOSIS — Z941 Heart transplant status: Secondary | ICD-10-CM | POA: Diagnosis not present

## 2021-06-13 DIAGNOSIS — R918 Other nonspecific abnormal finding of lung field: Secondary | ICD-10-CM | POA: Diagnosis not present

## 2021-06-29 DIAGNOSIS — Z79899 Other long term (current) drug therapy: Secondary | ICD-10-CM | POA: Diagnosis not present

## 2021-06-29 DIAGNOSIS — C44599 Other specified malignant neoplasm of skin of other part of trunk: Secondary | ICD-10-CM | POA: Diagnosis not present

## 2021-06-29 DIAGNOSIS — C44509 Unspecified malignant neoplasm of skin of other part of trunk: Secondary | ICD-10-CM | POA: Diagnosis not present

## 2021-07-12 DIAGNOSIS — Z941 Heart transplant status: Secondary | ICD-10-CM | POA: Diagnosis not present

## 2021-07-12 DIAGNOSIS — Z48298 Encounter for aftercare following other organ transplant: Secondary | ICD-10-CM | POA: Diagnosis not present

## 2021-07-12 DIAGNOSIS — D849 Immunodeficiency, unspecified: Secondary | ICD-10-CM | POA: Diagnosis not present

## 2021-07-19 DIAGNOSIS — M2550 Pain in unspecified joint: Secondary | ICD-10-CM | POA: Diagnosis not present

## 2021-07-19 DIAGNOSIS — Z6821 Body mass index (BMI) 21.0-21.9, adult: Secondary | ICD-10-CM | POA: Diagnosis not present

## 2021-07-19 DIAGNOSIS — M1009 Idiopathic gout, multiple sites: Secondary | ICD-10-CM | POA: Diagnosis not present

## 2021-07-19 DIAGNOSIS — Z79899 Other long term (current) drug therapy: Secondary | ICD-10-CM | POA: Diagnosis not present

## 2021-07-19 DIAGNOSIS — M150 Primary generalized (osteo)arthritis: Secondary | ICD-10-CM | POA: Diagnosis not present

## 2021-07-19 DIAGNOSIS — B370 Candidal stomatitis: Secondary | ICD-10-CM | POA: Diagnosis not present

## 2021-07-28 DIAGNOSIS — Z23 Encounter for immunization: Secondary | ICD-10-CM | POA: Diagnosis not present

## 2021-08-03 DIAGNOSIS — Z23 Encounter for immunization: Secondary | ICD-10-CM | POA: Diagnosis not present

## 2021-08-04 DIAGNOSIS — D2270 Melanocytic nevi of unspecified lower limb, including hip: Secondary | ICD-10-CM | POA: Diagnosis not present

## 2021-08-04 DIAGNOSIS — D2260 Melanocytic nevi of unspecified upper limb, including shoulder: Secondary | ICD-10-CM | POA: Diagnosis not present

## 2021-08-04 DIAGNOSIS — L814 Other melanin hyperpigmentation: Secondary | ICD-10-CM | POA: Diagnosis not present

## 2021-08-04 DIAGNOSIS — D849 Immunodeficiency, unspecified: Secondary | ICD-10-CM | POA: Diagnosis not present

## 2021-08-04 DIAGNOSIS — L821 Other seborrheic keratosis: Secondary | ICD-10-CM | POA: Diagnosis not present

## 2021-08-04 DIAGNOSIS — L659 Nonscarring hair loss, unspecified: Secondary | ICD-10-CM | POA: Diagnosis not present

## 2021-08-04 DIAGNOSIS — L570 Actinic keratosis: Secondary | ICD-10-CM | POA: Diagnosis not present

## 2021-08-04 DIAGNOSIS — Z85828 Personal history of other malignant neoplasm of skin: Secondary | ICD-10-CM | POA: Diagnosis not present

## 2021-08-04 DIAGNOSIS — D485 Neoplasm of uncertain behavior of skin: Secondary | ICD-10-CM | POA: Diagnosis not present

## 2021-08-04 DIAGNOSIS — D225 Melanocytic nevi of trunk: Secondary | ICD-10-CM | POA: Diagnosis not present

## 2021-08-16 DIAGNOSIS — M25531 Pain in right wrist: Secondary | ICD-10-CM | POA: Diagnosis not present

## 2021-08-16 DIAGNOSIS — M810 Age-related osteoporosis without current pathological fracture: Secondary | ICD-10-CM | POA: Diagnosis not present

## 2021-08-22 DIAGNOSIS — Z950 Presence of cardiac pacemaker: Secondary | ICD-10-CM | POA: Diagnosis not present

## 2021-08-30 ENCOUNTER — Ambulatory Visit: Payer: Medicare Other | Admitting: Podiatry

## 2021-09-06 ENCOUNTER — Other Ambulatory Visit: Payer: Self-pay

## 2021-09-06 ENCOUNTER — Encounter: Payer: Self-pay | Admitting: Podiatry

## 2021-09-06 ENCOUNTER — Ambulatory Visit (INDEPENDENT_AMBULATORY_CARE_PROVIDER_SITE_OTHER): Payer: Medicare Other | Admitting: Podiatry

## 2021-09-06 DIAGNOSIS — M79676 Pain in unspecified toe(s): Secondary | ICD-10-CM

## 2021-09-06 DIAGNOSIS — L603 Nail dystrophy: Secondary | ICD-10-CM

## 2021-09-06 DIAGNOSIS — B351 Tinea unguium: Secondary | ICD-10-CM

## 2021-09-07 NOTE — Progress Notes (Signed)
She presents today concerned about the nail of the hallux right.  States that is starting to look bad again is got a black spot under it is concerned that the fungus may be coming back.  Objective: Vital signs are stable alert oriented x3 she definitely has a nail dystrophy in this nail as she refers to the hallux right as that there is a ridge running from proximal to distal most likely I nail root and began injury this resulting in this splitting of the nail however she does have an area of subungual hematoma present and the nail is loose from that area distally.  I cut that nail away today taking approximately a quarter of her nail off completely.  The nailbed appears to be injured there is no laceration no signs of infection just some hematoma that is dried.  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  Plan: Debridement of the nail today follow-up with me occasionally for debridement.

## 2021-09-20 DIAGNOSIS — Z950 Presence of cardiac pacemaker: Secondary | ICD-10-CM | POA: Diagnosis not present

## 2021-09-27 DIAGNOSIS — R399 Unspecified symptoms and signs involving the genitourinary system: Secondary | ICD-10-CM | POA: Diagnosis not present

## 2021-09-27 DIAGNOSIS — N390 Urinary tract infection, site not specified: Secondary | ICD-10-CM | POA: Diagnosis not present

## 2021-09-27 DIAGNOSIS — R300 Dysuria: Secondary | ICD-10-CM | POA: Diagnosis not present

## 2021-10-27 DIAGNOSIS — N952 Postmenopausal atrophic vaginitis: Secondary | ICD-10-CM | POA: Insufficient documentation

## 2021-10-27 DIAGNOSIS — M1000 Idiopathic gout, unspecified site: Secondary | ICD-10-CM | POA: Insufficient documentation

## 2021-11-02 ENCOUNTER — Telehealth: Payer: Self-pay | Admitting: Neurology

## 2021-11-02 DIAGNOSIS — D849 Immunodeficiency, unspecified: Secondary | ICD-10-CM | POA: Diagnosis not present

## 2021-11-02 DIAGNOSIS — R8271 Bacteriuria: Secondary | ICD-10-CM | POA: Diagnosis not present

## 2021-11-02 DIAGNOSIS — N3941 Urge incontinence: Secondary | ICD-10-CM | POA: Diagnosis not present

## 2021-11-02 DIAGNOSIS — N952 Postmenopausal atrophic vaginitis: Secondary | ICD-10-CM | POA: Diagnosis not present

## 2021-11-02 NOTE — Telephone Encounter (Signed)
Pt called stating that she is wanting to be assigned to a new MD so that she can be seen sooner than what the NP has available. Please advise.

## 2021-11-02 NOTE — Telephone Encounter (Signed)
Pt could not talk, she was at a physician's office and said she would call back to schedule the appt.

## 2021-11-03 DIAGNOSIS — D1801 Hemangioma of skin and subcutaneous tissue: Secondary | ICD-10-CM | POA: Diagnosis not present

## 2021-11-03 DIAGNOSIS — B370 Candidal stomatitis: Secondary | ICD-10-CM | POA: Diagnosis not present

## 2021-11-03 DIAGNOSIS — Q828 Other specified congenital malformations of skin: Secondary | ICD-10-CM | POA: Diagnosis not present

## 2021-11-03 DIAGNOSIS — L603 Nail dystrophy: Secondary | ICD-10-CM | POA: Diagnosis not present

## 2021-11-03 DIAGNOSIS — B351 Tinea unguium: Secondary | ICD-10-CM | POA: Diagnosis not present

## 2021-11-03 DIAGNOSIS — D849 Immunodeficiency, unspecified: Secondary | ICD-10-CM | POA: Diagnosis not present

## 2021-11-03 DIAGNOSIS — L570 Actinic keratosis: Secondary | ICD-10-CM | POA: Diagnosis not present

## 2021-11-03 DIAGNOSIS — L821 Other seborrheic keratosis: Secondary | ICD-10-CM | POA: Diagnosis not present

## 2021-11-03 DIAGNOSIS — L03011 Cellulitis of right finger: Secondary | ICD-10-CM | POA: Diagnosis not present

## 2021-11-03 DIAGNOSIS — Z85828 Personal history of other malignant neoplasm of skin: Secondary | ICD-10-CM | POA: Diagnosis not present

## 2021-11-14 DIAGNOSIS — C44529 Squamous cell carcinoma of skin of other part of trunk: Secondary | ICD-10-CM | POA: Diagnosis not present

## 2021-11-22 DIAGNOSIS — D849 Immunodeficiency, unspecified: Secondary | ICD-10-CM | POA: Diagnosis not present

## 2021-11-22 DIAGNOSIS — Z950 Presence of cardiac pacemaker: Secondary | ICD-10-CM | POA: Diagnosis not present

## 2021-11-22 DIAGNOSIS — Z941 Heart transplant status: Secondary | ICD-10-CM | POA: Diagnosis not present

## 2021-11-22 DIAGNOSIS — Z79899 Other long term (current) drug therapy: Secondary | ICD-10-CM | POA: Diagnosis not present

## 2021-11-22 DIAGNOSIS — I081 Rheumatic disorders of both mitral and tricuspid valves: Secondary | ICD-10-CM | POA: Diagnosis not present

## 2021-11-22 DIAGNOSIS — I495 Sick sinus syndrome: Secondary | ICD-10-CM | POA: Diagnosis not present

## 2021-11-22 DIAGNOSIS — Z45018 Encounter for adjustment and management of other part of cardiac pacemaker: Secondary | ICD-10-CM | POA: Diagnosis not present

## 2021-11-22 DIAGNOSIS — I501 Left ventricular failure: Secondary | ICD-10-CM | POA: Diagnosis not present

## 2021-11-22 DIAGNOSIS — Z298 Encounter for other specified prophylactic measures: Secondary | ICD-10-CM | POA: Diagnosis not present

## 2021-11-22 DIAGNOSIS — R053 Chronic cough: Secondary | ICD-10-CM | POA: Diagnosis not present

## 2021-11-22 DIAGNOSIS — R9431 Abnormal electrocardiogram [ECG] [EKG]: Secondary | ICD-10-CM | POA: Diagnosis not present

## 2021-11-22 DIAGNOSIS — Z48298 Encounter for aftercare following other organ transplant: Secondary | ICD-10-CM | POA: Diagnosis not present

## 2021-12-22 ENCOUNTER — Encounter: Payer: Self-pay | Admitting: Neurology

## 2021-12-22 ENCOUNTER — Ambulatory Visit (INDEPENDENT_AMBULATORY_CARE_PROVIDER_SITE_OTHER): Payer: Medicare Other | Admitting: Neurology

## 2021-12-22 VITALS — BP 114/69 | HR 64 | Ht 65.0 in | Wt 129.0 lb

## 2021-12-22 DIAGNOSIS — H903 Sensorineural hearing loss, bilateral: Secondary | ICD-10-CM | POA: Insufficient documentation

## 2021-12-22 DIAGNOSIS — M6281 Muscle weakness (generalized): Secondary | ICD-10-CM

## 2021-12-22 DIAGNOSIS — D649 Anemia, unspecified: Secondary | ICD-10-CM

## 2021-12-22 DIAGNOSIS — R2689 Other abnormalities of gait and mobility: Secondary | ICD-10-CM

## 2021-12-22 DIAGNOSIS — R682 Dry mouth, unspecified: Secondary | ICD-10-CM | POA: Insufficient documentation

## 2021-12-22 DIAGNOSIS — M2550 Pain in unspecified joint: Secondary | ICD-10-CM | POA: Insufficient documentation

## 2021-12-22 DIAGNOSIS — Z79899 Other long term (current) drug therapy: Secondary | ICD-10-CM | POA: Insufficient documentation

## 2021-12-22 DIAGNOSIS — Z831 Family history of other infectious and parasitic diseases: Secondary | ICD-10-CM | POA: Insufficient documentation

## 2021-12-22 DIAGNOSIS — G629 Polyneuropathy, unspecified: Secondary | ICD-10-CM

## 2021-12-22 DIAGNOSIS — B370 Candidal stomatitis: Secondary | ICD-10-CM | POA: Insufficient documentation

## 2021-12-22 MED ORDER — DULOXETINE HCL 30 MG PO CPEP: 30.0000 mg | ORAL_CAPSULE | Freq: Every evening | ORAL | 3 refills | Status: DC

## 2021-12-22 NOTE — Progress Notes (Signed)
? ?Chief Complaint  ?Patient presents with  ? Follow-up  ?  Rm 14. Alone. ?Discuss options to Cymbalta. Pt states it makes her sick, she takes Cymbalta with Zofran to combat nausea. Pt needs left knee replacement.  ? ? ? ? ?ASSESSMENT AND PLAN ? ?Cassie Harvey is a 76 y.o. female   ?History of polio, with residual left leg weakness, gait abnormality  ?Lightheadedness when standing up ?Worsening low back pain ? She reported mild improvement with low-dose Cymbalta 30 mg daily ? Her reported dizziness upon standing up and due to dehydration, medication side effect, we were not able to demonstrate positive orthostatic blood pressure versus tachycardia today's examination ? Advised to increase water intake ? Check A1c, B12 ? ? ?DIAGNOSTIC DATA (LABS, IMAGING, TESTING) ?- I reviewed patient records, labs, notes, testing and imaging myself where available. ? ? ?MEDICAL HISTORY: ? ?Cassie Harvey is a 76 year old female with patient of Dr. Jannifer Franklin, history of polio with residual left leg weakness, chronic low back pain, ? ? ?I reviewed and summarized the referring note. PMHX. ?HTN ?Polio, affecting left leg, left leg weakness. ?CAD   ?Heart Transplant in 2004,  cellcept '500mg'$  bid, neoral '25mg'$  bid. S/p pacemaker ?S/p Left leg DVT ? ?She suffered polio as a child, with residual mild left leg weakness, but highly functioning doing very well on her life ? ?She suffered severe coronary artery disease, heart failure, had heart transplant at [facility] in 2004, continuing to do very well, had pacemaker for irregular heart rate, left lower extremity DVT peri-heart transplant period of time, since then, she had increased left lower extremity weakness, also had left knee replacement, should not have increased the right knee pain, which has pose more gait difficulty ? ?She has mild bilateral lower extremity feet paresthesia, corrugate diagnosis of peripheral neuropathy, EMG nerve conduction study in 2015 showed no significant  abnormalities ? ?She also has longstanding history of chronic low back pain, ? ?I personally reviewed CT myelogram in March 2016, multilevel degenerative changes, facet arthropathy throughout the lumbar region, most pronounced on the right at L1-2, and L4-5, on the left at L5-S1, but no significant central canal stenosis, no significant foraminal stenosis ?CT cervical spine showed mild curvature convex to the left, mild spondylosis, no significant canal and foraminal narrowing ? ?CT head was unremarkable ? ?She now complains of lightheadedness standing up, dizziness sensation, orthostatic blood pressure measurement showed no significant abnormality ? ? ?PHYSICAL EXAM: ?  ?Vitals:  ? 12/22/21 1403  ?BP: 114/69  ?Pulse: 64  ?Weight: 129 lb (58.5 kg)  ?Height: '5\' 5"'$  (1.651 m)  ? ?Sitting 125/74, 70;  standing 109/69,77;  1 minute, 121/78,74;  3  minutes,100/69,71; 118/76,74; ? ? ?Body mass index is 21.47 kg/m?. ? ?PHYSICAL EXAMNIATION: ? ?Gen: NAD, conversant, well nourised, well groomed                     ?Cardiovascular: Regular rate rhythm, no peripheral edema, warm, nontender. ?Eyes: Conjunctivae clear without exudates or hemorrhage ?Neck: Supple, no carotid bruits. ?Pulmonary: Clear to auscultation bilaterally  ? ?NEUROLOGICAL EXAM: ? ?MENTAL STATUS: ?Speech/cognition: ? ?Awake, alert, oriented to history taking and casual conversation ?CRANIAL NERVES: ?CN II: Visual fields are full to confrontation. Pupils are round equal and briskly reactive to light. ?CN III, IV, VI: extraocular movement are normal. No ptosis. ?CN V: Facial sensation is intact to light touch ?CN VII: Face is symmetric with normal eye closure  ?CN VIII: Hearing is  normal to causal conversation. ?CN IX, X: Phonation is normal. ?CN XI: Head turning and shoulder shrug are intact ? ?MOTOR: She has mild left hip flexion weakness, left knee flexion 4, knee extension 3, left ankle dorsiflexion 4, plantarflexion 4 ? ?REFLEXES: ?Reflexes are hypoactive  and symmetric  ?SENSORY: ?Intact to light touch, ? ?COORDINATION: ?There is no trunk or limb dysmetria noted. ? ?GAIT/STANCE: She needs push-up to get up from seated position, rely on her cane, dragging her left leg, hyperextended at left knee ? ?REVIEW OF SYSTEMS:  ?Full 14 system review of systems performed and notable only for as above ?All other review of systems were negative. ? ? ?ALLERGIES: ?Allergies  ?Allergen Reactions  ? Oxybutynin Shortness Of Breath  ? Bacitracin Swelling  ? Colchicine   ?  Other reaction(s): diarrhea  ? Lisinopril Cough  ? Neomycin Swelling  ? Sulfa Antibiotics Rash  ? Sulfabenzamide Rash  ? ? ?HOME MEDICATIONS: ?Current Outpatient Medications  ?Medication Sig Dispense Refill  ? allopurinol (ZYLOPRIM) 100 MG tablet Take 100 mg by mouth daily.    ? Ascorbic Acid (VITAMIN C PO) Take 500 mg by mouth in the morning, at noon, and at bedtime.    ? ASPIRIN 81 PO Take by mouth.    ? B Complex-C (SUPER B COMPLEX PO) Take by mouth.    ? calcium-vitamin D (OSCAL WITH D) 500-200 MG-UNIT per tablet Take 1 tablet by mouth 2 (two) times daily.    ? carvedilol (COREG) 3.125 MG tablet Take 3.125 mg by mouth 2 (two) times daily with a meal.    ? Cranberry-Vitamin C (CRANBERRY CONCENTRATE/VITAMINC PO) Take 1 capsule by mouth 2 (two) times daily.    ? cycloSPORINE modified (NEORAL) 25 MG capsule Take 25 mg by mouth 2 (two) times daily. Patient takes 75 in the am and 75 at 10pm    ? DULoxetine (CYMBALTA) 30 MG capsule Take 1 capsule (30 mg total) by mouth 2 (two) times daily. 60 capsule 5  ? folic acid (FOLVITE) 1 MG tablet daily.   10  ? Menatetrenone (VITAMIN K2) 100 MCG TABS Take 1 tablet by mouth 2 (two) times daily.    ? Multiple Vitamin (MULTIVITAMIN WITH MINERALS) TABS tablet Take 1 tablet by mouth daily.    ? mycophenolate (CELLCEPT) 500 MG tablet Take 500 mg by mouth 2 (two) times daily.    ? niacin (NIASPAN) 500 MG CR tablet Take 500 mg by mouth at bedtime.    ? Niacinamide-Zn-Cu-Methfo-Se-Cr  (NICOTINAMIDE PO) Take 1 tablet by mouth 2 (two) times daily.    ? Omega-3 Fatty Acids (FISH OIL PO) Take 1,000 mg by mouth 3 (three) times daily.     ? ondansetron (ZOFRAN) 8 MG tablet Take by mouth every 8 (eight) hours as needed for nausea.    ? solifenacin (VESICARE) 10 MG tablet Take by mouth daily.    ? UNABLE TO FIND metagenics-ultraflora IB 60BN CFu once a day    ? VITAMIN D PO Take 2,000 Units by mouth 3 (three) times daily.    ? ?No current facility-administered medications for this visit.  ? ? ?PAST MEDICAL HISTORY: ?Past Medical History:  ?Diagnosis Date  ? Broken shoulder   ? right  ? Chronic pain   ? DDD (degenerative disc disease), lumbar   ? DJD (degenerative joint disease)   ? Family history of anesthesia complication   ? My father suffered from a dysrythmia  ? Heart transplanted [facility]) 2004  ? History of  poliomyelitis 10/15/2013  ? Hypertension   ? IBS (irritable bowel syndrome)   ? Immunosuppression ([place])   ? 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 Mouth ulcers   ? Neuropathy, peripheral   ? Pacemaker 01/13/2003  ? Pneumonia   ? PONV (postoperative nausea and vomiting)   ? Urinary frequency   ? Wrist fracture, bilateral   ? Surgical repair left  ? ? ?PAST SURGICAL HISTORY: ?Past Surgical History:  ?Procedure Laterality Date  ? APPENDECTOMY    ? CARDIAC CATHETERIZATION  2003  ? 2015  ? COLONOSCOPY    ? COLONOSCOPY N/A 01/12/2014  ? Procedure: COLONOSCOPY;  Surgeon: Juanita Craver, MD;  Location: [facility];  Service: Endoscopy;  Laterality: N/A;  ? [place] OF UTERUS  1988  ? 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 EAR EXAMINATION UNDER ANESTHESIA  1977  ? lt middle ear   ? ELBOW ARTHROPLASTY    ? rt  ? HEART TRANSPLANT  11/2002  ? INJECTION KNEE Left 01/16/2013  ? Procedure: KNEE INJECTION;  Surgeon: [name], MD;  Location: [place];  Service: Orthopedics;  Laterality: Left;  STEROID ?  ? KNEE ARTHROSCOPY  1998  ? rt  ? KNEE ARTHROSCOPY WITH MEDIAL MENISECTOMY Right  01/16/2013  ? Procedure: RIGHT KNEE ARTHROSCOPY WITH MEDIAL AND LATERAL MENISECTOMY, REMOVAL LOOSE FOREIGN BODY AND STEROID INJECTION;  Surgeon: [name], MD;  Location: [place];  Service: Jenetta Downer

## 2021-12-23 ENCOUNTER — Telehealth: Payer: Self-pay | Admitting: Neurology

## 2021-12-23 LAB — HGB A1C W/O EAG: Hgb A1c MFr Bld: 5.2 % (ref 4.8–5.6)

## 2021-12-23 LAB — VITAMIN B12: Vitamin B-12: 1301 pg/mL — ABNORMAL HIGH (ref 232–1245)

## 2021-12-23 LAB — FERRITIN: Ferritin: 183 ng/mL — ABNORMAL HIGH (ref 15–150)

## 2021-12-23 NOTE — Telephone Encounter (Signed)
Please call patient, laboratory evaluation showed mild elevated ferritin, which is the indication for iron load, ? ?Please do not take any iron supplements ? ?Rest of the laboratory evaluation showed no significant abnormalities. ?

## 2021-12-26 NOTE — Telephone Encounter (Signed)
I spoke to the patient and notified her of the lab results. She does not take additional iron supplements.  ?

## 2021-12-26 NOTE — Telephone Encounter (Addendum)
Attempted to call pt, LVM for call back  °

## 2022-01-17 DIAGNOSIS — Z6821 Body mass index (BMI) 21.0-21.9, adult: Secondary | ICD-10-CM | POA: Diagnosis not present

## 2022-01-17 DIAGNOSIS — M1009 Idiopathic gout, multiple sites: Secondary | ICD-10-CM | POA: Diagnosis not present

## 2022-01-17 DIAGNOSIS — Z79899 Other long term (current) drug therapy: Secondary | ICD-10-CM | POA: Diagnosis not present

## 2022-01-17 DIAGNOSIS — M1991 Primary osteoarthritis, unspecified site: Secondary | ICD-10-CM | POA: Diagnosis not present

## 2022-01-26 DIAGNOSIS — D849 Immunodeficiency, unspecified: Secondary | ICD-10-CM | POA: Diagnosis not present

## 2022-01-26 DIAGNOSIS — D1801 Hemangioma of skin and subcutaneous tissue: Secondary | ICD-10-CM | POA: Diagnosis not present

## 2022-01-26 DIAGNOSIS — Q828 Other specified congenital malformations of skin: Secondary | ICD-10-CM | POA: Diagnosis not present

## 2022-01-26 DIAGNOSIS — L03011 Cellulitis of right finger: Secondary | ICD-10-CM | POA: Diagnosis not present

## 2022-01-26 DIAGNOSIS — Z85828 Personal history of other malignant neoplasm of skin: Secondary | ICD-10-CM | POA: Diagnosis not present

## 2022-01-26 DIAGNOSIS — B351 Tinea unguium: Secondary | ICD-10-CM | POA: Diagnosis not present

## 2022-01-26 DIAGNOSIS — L570 Actinic keratosis: Secondary | ICD-10-CM | POA: Diagnosis not present

## 2022-01-26 DIAGNOSIS — L821 Other seborrheic keratosis: Secondary | ICD-10-CM | POA: Diagnosis not present

## 2022-01-26 DIAGNOSIS — L603 Nail dystrophy: Secondary | ICD-10-CM | POA: Diagnosis not present

## 2022-02-15 DIAGNOSIS — M810 Age-related osteoporosis without current pathological fracture: Secondary | ICD-10-CM | POA: Diagnosis not present

## 2022-02-15 DIAGNOSIS — E559 Vitamin D deficiency, unspecified: Secondary | ICD-10-CM | POA: Diagnosis not present

## 2022-02-21 DIAGNOSIS — Z950 Presence of cardiac pacemaker: Secondary | ICD-10-CM | POA: Diagnosis not present

## 2022-02-22 DIAGNOSIS — Z950 Presence of cardiac pacemaker: Secondary | ICD-10-CM | POA: Diagnosis not present

## 2022-05-03 DIAGNOSIS — N390 Urinary tract infection, site not specified: Secondary | ICD-10-CM | POA: Diagnosis not present

## 2022-05-03 DIAGNOSIS — N3941 Urge incontinence: Secondary | ICD-10-CM | POA: Diagnosis not present

## 2022-05-03 DIAGNOSIS — N952 Postmenopausal atrophic vaginitis: Secondary | ICD-10-CM | POA: Diagnosis not present

## 2022-05-03 DIAGNOSIS — D849 Immunodeficiency, unspecified: Secondary | ICD-10-CM | POA: Diagnosis not present

## 2022-05-03 DIAGNOSIS — R82998 Other abnormal findings in urine: Secondary | ICD-10-CM | POA: Diagnosis not present

## 2022-05-03 DIAGNOSIS — R3915 Urgency of urination: Secondary | ICD-10-CM | POA: Diagnosis not present

## 2022-05-03 DIAGNOSIS — R8271 Bacteriuria: Secondary | ICD-10-CM | POA: Diagnosis not present

## 2022-05-03 DIAGNOSIS — R800 Isolated proteinuria: Secondary | ICD-10-CM | POA: Diagnosis not present

## 2022-05-16 DIAGNOSIS — I495 Sick sinus syndrome: Secondary | ICD-10-CM | POA: Diagnosis not present

## 2022-05-16 DIAGNOSIS — Z45018 Encounter for adjustment and management of other part of cardiac pacemaker: Secondary | ICD-10-CM | POA: Diagnosis not present

## 2022-05-16 DIAGNOSIS — Z950 Presence of cardiac pacemaker: Secondary | ICD-10-CM | POA: Diagnosis not present

## 2022-05-16 DIAGNOSIS — Z79899 Other long term (current) drug therapy: Secondary | ICD-10-CM | POA: Diagnosis not present

## 2022-05-16 DIAGNOSIS — Z7982 Long term (current) use of aspirin: Secondary | ICD-10-CM | POA: Diagnosis not present

## 2022-07-04 ENCOUNTER — Ambulatory Visit (INDEPENDENT_AMBULATORY_CARE_PROVIDER_SITE_OTHER): Payer: Medicare Other | Admitting: Neurology

## 2022-07-04 ENCOUNTER — Telehealth: Payer: Self-pay | Admitting: Neurology

## 2022-07-04 VITALS — BP 172/88 | HR 106 | Ht 65.0 in | Wt 131.0 lb

## 2022-07-04 DIAGNOSIS — R296 Repeated falls: Secondary | ICD-10-CM | POA: Diagnosis not present

## 2022-07-04 DIAGNOSIS — R42 Dizziness and giddiness: Secondary | ICD-10-CM | POA: Diagnosis not present

## 2022-07-04 DIAGNOSIS — R2689 Other abnormalities of gait and mobility: Secondary | ICD-10-CM

## 2022-07-04 DIAGNOSIS — R208 Other disturbances of skin sensation: Secondary | ICD-10-CM

## 2022-07-04 MED ORDER — DULOXETINE HCL 30 MG PO CPEP: 30.0000 mg | ORAL_CAPSULE | Freq: Every evening | ORAL | 3 refills | Status: DC

## 2022-07-04 NOTE — Patient Instructions (Signed)
We will check CT head, you can coordinate with [facility]  Continue the Cymbalta We will see you back in 6 months

## 2022-07-04 NOTE — Telephone Encounter (Signed)
medicare/[employer] NPR sent to GI [phone] 

## 2022-07-04 NOTE — Progress Notes (Signed)
Patient: Cassie Harvey Date of Birth: 09-20-1946  Reason for Visit: Follow up History from: Patient Primary Neurologist: Willis/Yan   ASSESSMENT AND PLAN 76 y.o. year old female   36.  History of polio 2.  Residual left leg weakness 3.  Gait abnormality 4.  Chronic low back pain 5.  Recurrent falls 6.  Current pacemaker  -Check CT head per patient request, concern for 2 falls hitting her head -Continue Cymbalta 30 mg daily -Is following up with Dr. Ellene Route for consideration of ESI for low back pain -Wishes to follow-up in 6 months or sooner if needed  HISTORY OF PRESENT ILLNESS: Cassie Harvey is a 76 year old female with patient of Dr. Jannifer Franklin, history of polio with residual left leg weakness, chronic low back pain,   I reviewed and summarized the referring note. PMHX. HTN Polio, affecting left leg, left leg weakness. CAD   Heart Transplant in 2004,  cellcept '500mg'$  bid, neoral '25mg'$  bid. S/p pacemaker S/p Left leg DVT   She suffered polio as a child, with residual mild left leg weakness, but highly functioning doing very well on her life  She suffered severe coronary artery disease, heart failure, had heart transplant at [facility] in 2004, continuing to do very well, had pacemaker for irregular heart rate, left lower extremity DVT peri-heart transplant period of time, since then, she had increased left lower extremity weakness, also had left knee replacement, should not have increased the right knee pain, which has pose more gait difficulty   She has mild bilateral lower extremity feet paresthesia, corrugate diagnosis of peripheral neuropathy, EMG nerve conduction study in 2015 showed no significant abnormalities  She also has longstanding history of chronic low back pain,  I personally reviewed CT myelogram in March 2016, multilevel degenerative changes, facet arthropathy throughout the lumbar region, most pronounced on the right at L1-2, and L4-5, on the left at L5-S1,  but no significant central canal stenosis, no significant foraminal stenosis CT cervical spine showed mild curvature convex to the left, mild spondylosis, no significant canal and foraminal narrowing  CT head was unremarkable   She now complains of lightheadedness standing up, dizziness sensation, orthostatic blood pressure measurement showed no significant abnormality  Update July 04, 2022 SS: No pain in legs during the day, happens at night, burning in legs. Still on Cymbalta 30 mg daily, moved it up to noon. Does feel sleepy in the morning. Has had a few falls, uses cane, lose balance, potential BP? Worried with 2 falls bumped her head, would like CT scan .Has had ESI with Dr. Ellene Route for low back pain, last was few years ago, is going to call. Doesn't have PCP. Gets Prolia. Has pacemaker. Mentions memory, can be forgetful, mother had AD in her 39's. 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 Looks good today.   REVIEW OF SYSTEMS: Out of a complete 14 system review of symptoms, the patient complains only of the following symptoms, and all other reviewed systems are negative.  See HPI  ALLERGIES: Allergies  Allergen Reactions   Oxybutynin Shortness Of Breath   Bacitracin Swelling   Colchicine     Other reaction(s): diarrhea   Lisinopril Cough   Neomycin Swelling   Sulfa Antibiotics Rash   Sulfabenzamide Rash    HOME MEDICATIONS: Outpatient Medications Prior to Visit  Medication Sig Dispense Refill   allopurinol (ZYLOPRIM) 100 MG tablet Take 100 mg by mouth daily.     Ascorbic Acid (VITAMIN  C PO) Take 500 mg by mouth in the morning, at noon, and at bedtime.     ASPIRIN 81 PO Take by mouth.     B Complex-C (SUPER B COMPLEX PO) Take by mouth.     calcium-vitamin D (OSCAL WITH D) 500-200 MG-UNIT per tablet Take 1 tablet by mouth 2 (two) times daily.     carvedilol (COREG) 3.125 MG tablet Take 3.125 mg by mouth 2 (two) times daily with a meal.      Cranberry-Vitamin C (CRANBERRY CONCENTRATE/VITAMINC PO) Take 1 capsule by mouth 2 (two) times daily.     cycloSPORINE modified (NEORAL) 25 MG capsule Take 25 mg by mouth 2 (two) times daily. Patient takes 75 in the am and 75 at 10pm     DULoxetine (CYMBALTA) 30 MG capsule Take 1 capsule (30 mg total) by mouth at bedtime. 90 capsule 3   folic acid (FOLVITE) 1 MG tablet daily.   10   Menatetrenone (VITAMIN K2) 100 MCG TABS Take 1 tablet by mouth 2 (two) times daily.     Multiple Vitamin (MULTIVITAMIN WITH MINERALS) TABS tablet Take 1 tablet by mouth daily.     mycophenolate (CELLCEPT) 500 MG tablet Take 500 mg by mouth 2 (two) times daily.     niacin (NIASPAN) 500 MG CR tablet Take 500 mg by mouth at bedtime.     Niacinamide-Zn-Cu-Methfo-Se-Cr (NICOTINAMIDE PO) Take 1 tablet by mouth 2 (two) times daily.     Omega-3 Fatty Acids (FISH OIL PO) Take 1,000 mg by mouth 3 (three) times daily.      ondansetron (ZOFRAN) 8 MG tablet Take by mouth every 8 (eight) hours as needed for nausea.     solifenacin (VESICARE) 10 MG tablet Take by mouth daily.     UNABLE TO FIND metagenics-ultraflora IB 60BN CFu once a day     VITAMIN D PO Take 2,000 Units by mouth 3 (three) times daily.     No facility-administered medications prior to visit.    PAST MEDICAL HISTORY: Past Medical History:  Diagnosis Date   Broken shoulder    right   Chronic pain    DDD (degenerative disc disease), lumbar    DJD (degenerative joint disease)    Family history of anesthesia complication    My father suffered from a dysrythmia   Heart transplanted [facility]) 2004   History of poliomyelitis 10/15/2013   Hypertension    IBS (irritable bowel syndrome)    Immunosuppression ([place])    Molar pregnancy    Tx; with chemo   Mouth ulcers    Neuropathy, peripheral    Pacemaker 01/13/2003   Pneumonia    PONV (postoperative nausea and vomiting)    Urinary frequency    Wrist fracture, bilateral    Surgical repair left    PAST SURGICAL  HISTORY: Past Surgical History:  Procedure Laterality Date   APPENDECTOMY     CARDIAC CATHETERIZATION  2003   2015   COLONOSCOPY     COLONOSCOPY N/A 01/12/2014   Procedure: COLONOSCOPY;  Surgeon: Juanita Craver, MD;  [facility]: [facility];  Service: Endoscopy;  Laterality: N/A;   DILATION AND CURETTAGE OF UTERUS  1988   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   EAR EXAMINATION UNDER ANESTHESIA  1977   lt middle ear    ELBOW ARTHROPLASTY     rt   HEART TRANSPLANT  11/2002   INJECTION KNEE Left 01/16/2013   Procedure: KNEE INJECTION;  Surgeon: [name], MD;  Location: [place]  [facility];  Service: Orthopedics;  Laterality: Left;  STEROID    KNEE ARTHROSCOPY  1998   rt   KNEE ARTHROSCOPY WITH MEDIAL MENISECTOMY Right 01/16/2013   Procedure: RIGHT KNEE ARTHROSCOPY WITH MEDIAL AND LATERAL MENISECTOMY, REMOVAL LOOSE FOREIGN BODY AND STEROID INJECTION;  Surgeon: [name], MD;  Location: [place];  Service: Orthopedics;  Laterality: Right;  DEPRIEDMENT LATERAL MENISCUS AND CHONDROPLASTY   PACEMAKER INSERTION  2725,3664   new dual chamber -medtronic 5076-art/vent leads   SEPTOPLASTY  1992   SHOULDER ARTHROSCOPY  96,02   right abd left   STERIOD INJECTION Left 01/16/2013   Procedure: STEROID INJECTION;  Surgeon: [name], MD;  Location: [place];  Service: Orthopedics;  Laterality: Left;   TOTAL HIP ARTHROPLASTY  02/2011   rt   TOTAL KNEE ARTHROPLASTY Right 02/25/2014   Procedure: RIGHT TOTAL KNEE ARTHROPLASTY;  Surgeon: [name], MD;  Location: [place];  Service: Orthopedics;  Laterality: Right;   TUBAL LIGATION  1990   WRIST ARTHROPLASTY  2008   left    FAMILY HISTORY: Family History  Problem Relation Age of Onset   Pulmonary fibrosis Father     SOCIAL HISTORY: Social History   Socioeconomic History   Marital status: Married    Spouse name: Not on file   Number of children: 0   Years of education: college   Highest  education level: Not on file  Occupational History   Occupation: Retired  Tobacco Use   Smoking status: Never   Smokeless tobacco: Never  Substance and Sexual Activity   Alcohol use: No   Drug use: No   Sexual activity: Not on file  Other Topics Concern   Not on file  Social History Narrative   Not on file   Social Determinants of Health   Financial Resource Strain: Not on file  Food Insecurity: Not on file  Transportation Needs: Not on file  Physical Activity: Not on file  Stress: Not on file  Social Connections: Not on file  Intimate Partner Violence: Not on file   PHYSICAL EXAM  Vitals:   07/04/22 1516  BP: (!) 172/88  Pulse: (!) 106  Weight: 131 lb (59.4 kg)  Height: '5\' 5"'$  (1.651 m)   Body mass index is 21.8 kg/m.  Generalized: Well developed, in no acute distress  Neurological examination  Mentation: Alert oriented to time, place, history taking. Follows all commands speech and language fluent Cranial nerve II-XII: Pupils were equal round reactive to light. Extraocular movements were full, visual field were full on confrontational test. Facial sensation and strength were normal. Head turning and shoulder shrug  were normal and symmetric. Motor: 3/5 left hip flexion weakness, 4/5 left 3 extension and flexion Sensory: Sensory testing is intact to soft touch on all 4 extremities. No evidence of extinction is noted.  Coordination: Cerebellar testing reveals good finger-nose-finger and heel-to-shin bilaterally.  Gait and station: Gait is wide-based, cautious, left leg straight, drags Reflexes: Deep tendon reflexes are symmetric but decreased  DIAGNOSTIC DATA (LABS, IMAGING, TESTING) - I reviewed patient records, labs, notes, testing and imaging myself where available.  Lab Results  Component Value Date   WBC 12.5 (H) 10/12/2020   HGB 11.6 10/12/2020   HCT 33.9 (L) 10/12/2020   MCV 103 (H) 10/12/2020   PLT 395 10/12/2020      Component Value Date/Time   NA  140 10/12/2020 1346   K 4.8 10/12/2020 1346   CL 102 10/12/2020  1346   CO2 20 10/12/2020 1346   GLUCOSE 96 10/12/2020 1346   GLUCOSE 114 (H) 02/26/2014 0535   BUN 28 (H) 10/12/2020 1346   CREATININE 1.65 (H) 10/12/2020 1346   CALCIUM 9.5 10/12/2020 1346   PROT 6.6 10/12/2020 1346   ALBUMIN 3.9 10/12/2020 1346   AST 18 10/12/2020 1346   ALT 7 10/12/2020 1346   ALKPHOS 51 10/12/2020 1346   BILITOT 0.3 10/12/2020 1346   GFRNONAA 31 (L) 10/12/2020 1346   GFRAA 35 (L) 10/12/2020 1346   No results found for: "CHOL", "HDL", "LDLCALC", "LDLDIRECT", "TRIG", "CHOLHDL" Lab Results  Component Value Date   HGBA1C 5.2 12/22/2021   Lab Results  Component Value Date   YQMGNOIB70 4,888 (H) 12/22/2021   Lab Results  Component Value Date   TSH 2.530 10/16/2013    Butler Denmark, AGNP-C, DNP 07/04/2022, 3:22 PM [facility] [address] [place],  [address] [phone]

## 2022-07-12 DIAGNOSIS — M6289 Other specified disorders of muscle: Secondary | ICD-10-CM | POA: Diagnosis not present

## 2022-07-12 DIAGNOSIS — N952 Postmenopausal atrophic vaginitis: Secondary | ICD-10-CM | POA: Diagnosis not present

## 2022-07-12 DIAGNOSIS — R3989 Other symptoms and signs involving the genitourinary system: Secondary | ICD-10-CM | POA: Diagnosis not present

## 2022-07-12 DIAGNOSIS — R8271 Bacteriuria: Secondary | ICD-10-CM | POA: Diagnosis not present

## 2022-07-12 DIAGNOSIS — N390 Urinary tract infection, site not specified: Secondary | ICD-10-CM | POA: Diagnosis not present

## 2022-07-12 DIAGNOSIS — N3941 Urge incontinence: Secondary | ICD-10-CM | POA: Diagnosis not present

## 2022-07-12 DIAGNOSIS — D849 Immunodeficiency, unspecified: Secondary | ICD-10-CM | POA: Diagnosis not present

## 2022-07-12 DIAGNOSIS — R800 Isolated proteinuria: Secondary | ICD-10-CM | POA: Diagnosis not present

## 2022-07-12 DIAGNOSIS — R3915 Urgency of urination: Secondary | ICD-10-CM | POA: Diagnosis not present

## 2022-07-12 DIAGNOSIS — R399 Unspecified symptoms and signs involving the genitourinary system: Secondary | ICD-10-CM | POA: Diagnosis not present

## 2022-07-12 DIAGNOSIS — Z941 Heart transplant status: Secondary | ICD-10-CM | POA: Diagnosis not present

## 2022-07-12 DIAGNOSIS — R82998 Other abnormal findings in urine: Secondary | ICD-10-CM | POA: Diagnosis not present

## 2022-07-20 DIAGNOSIS — M1009 Idiopathic gout, multiple sites: Secondary | ICD-10-CM | POA: Diagnosis not present

## 2022-07-20 DIAGNOSIS — Z6821 Body mass index (BMI) 21.0-21.9, adult: Secondary | ICD-10-CM | POA: Diagnosis not present

## 2022-07-20 DIAGNOSIS — Z941 Heart transplant status: Secondary | ICD-10-CM | POA: Diagnosis not present

## 2022-07-20 DIAGNOSIS — Z79899 Other long term (current) drug therapy: Secondary | ICD-10-CM | POA: Diagnosis not present

## 2022-07-20 DIAGNOSIS — M1991 Primary osteoarthritis, unspecified site: Secondary | ICD-10-CM | POA: Diagnosis not present

## 2022-07-26 ENCOUNTER — Ambulatory Visit
Admission: RE | Admit: 2022-07-26 | Discharge: 2022-07-26 | Disposition: A | Discharge status: Home / Self Care | Payer: Medicare Other | Source: Ambulatory Visit | Attending: Neurology | Admitting: Neurology

## 2022-07-26 DIAGNOSIS — R42 Dizziness and giddiness: Secondary | ICD-10-CM

## 2022-07-26 DIAGNOSIS — R296 Repeated falls: Secondary | ICD-10-CM

## 2022-07-27 ENCOUNTER — Telehealth: Payer: Self-pay | Admitting: *Deleted

## 2022-07-27 DIAGNOSIS — D849 Immunodeficiency, unspecified: Secondary | ICD-10-CM | POA: Diagnosis not present

## 2022-07-27 DIAGNOSIS — L299 Pruritus, unspecified: Secondary | ICD-10-CM | POA: Diagnosis not present

## 2022-07-27 DIAGNOSIS — Q828 Other specified congenital malformations of skin: Secondary | ICD-10-CM | POA: Diagnosis not present

## 2022-07-27 DIAGNOSIS — L700 Acne vulgaris: Secondary | ICD-10-CM | POA: Diagnosis not present

## 2022-07-27 DIAGNOSIS — L570 Actinic keratosis: Secondary | ICD-10-CM | POA: Diagnosis not present

## 2022-07-27 DIAGNOSIS — Z85828 Personal history of other malignant neoplasm of skin: Secondary | ICD-10-CM | POA: Diagnosis not present

## 2022-07-27 DIAGNOSIS — L814 Other melanin hyperpigmentation: Secondary | ICD-10-CM | POA: Diagnosis not present

## 2022-07-27 NOTE — Telephone Encounter (Signed)
Called pt, advised her CT scan does not show any acute abnormality.  There is stable age-related atrophy and chronic white matter microvascular ischemic changes. Left # for questions.

## 2022-08-04 DIAGNOSIS — Z23 Encounter for immunization: Secondary | ICD-10-CM | POA: Diagnosis not present

## 2022-08-16 DIAGNOSIS — E559 Vitamin D deficiency, unspecified: Secondary | ICD-10-CM | POA: Diagnosis not present

## 2022-08-16 DIAGNOSIS — H903 Sensorineural hearing loss, bilateral: Secondary | ICD-10-CM | POA: Diagnosis not present

## 2022-08-16 DIAGNOSIS — M810 Age-related osteoporosis without current pathological fracture: Secondary | ICD-10-CM | POA: Diagnosis not present

## 2022-08-16 DIAGNOSIS — K146 Glossodynia: Secondary | ICD-10-CM | POA: Diagnosis not present

## 2022-08-16 DIAGNOSIS — R5383 Other fatigue: Secondary | ICD-10-CM | POA: Diagnosis not present

## 2022-08-21 DIAGNOSIS — M810 Age-related osteoporosis without current pathological fracture: Secondary | ICD-10-CM | POA: Diagnosis not present

## 2022-08-21 DIAGNOSIS — Z780 Asymptomatic menopausal state: Secondary | ICD-10-CM | POA: Diagnosis not present

## 2022-08-21 LAB — HM DEXA SCAN

## 2022-08-29 DIAGNOSIS — E559 Vitamin D deficiency, unspecified: Secondary | ICD-10-CM | POA: Diagnosis not present

## 2022-08-29 DIAGNOSIS — M810 Age-related osteoporosis without current pathological fracture: Secondary | ICD-10-CM | POA: Diagnosis not present

## 2022-09-05 DIAGNOSIS — K625 Hemorrhage of anus and rectum: Secondary | ICD-10-CM | POA: Diagnosis not present

## 2022-09-05 DIAGNOSIS — R194 Change in bowel habit: Secondary | ICD-10-CM | POA: Diagnosis not present

## 2022-09-05 DIAGNOSIS — R151 Fecal smearing: Secondary | ICD-10-CM | POA: Diagnosis not present

## 2022-09-12 ENCOUNTER — Other Ambulatory Visit: Payer: Self-pay | Admitting: Gastroenterology

## 2022-10-23 DIAGNOSIS — M47816 Spondylosis without myelopathy or radiculopathy, lumbar region: Secondary | ICD-10-CM | POA: Diagnosis not present

## 2022-11-23 DIAGNOSIS — E7800 Pure hypercholesterolemia, unspecified: Secondary | ICD-10-CM | POA: Diagnosis not present

## 2022-11-23 DIAGNOSIS — Z2989 Encounter for other specified prophylactic measures: Secondary | ICD-10-CM | POA: Diagnosis not present

## 2022-11-23 DIAGNOSIS — Z941 Heart transplant status: Secondary | ICD-10-CM | POA: Diagnosis not present

## 2022-11-23 DIAGNOSIS — R2689 Other abnormalities of gait and mobility: Secondary | ICD-10-CM | POA: Diagnosis not present

## 2022-11-23 DIAGNOSIS — I10 Essential (primary) hypertension: Secondary | ICD-10-CM | POA: Diagnosis not present

## 2022-11-23 DIAGNOSIS — Z79899 Other long term (current) drug therapy: Secondary | ICD-10-CM | POA: Diagnosis not present

## 2022-11-23 DIAGNOSIS — Z4821 Encounter for aftercare following heart transplant: Secondary | ICD-10-CM | POA: Diagnosis not present

## 2022-11-23 DIAGNOSIS — Z48298 Encounter for aftercare following other organ transplant: Secondary | ICD-10-CM | POA: Diagnosis not present

## 2022-11-23 DIAGNOSIS — D84821 Immunodeficiency due to drugs: Secondary | ICD-10-CM | POA: Diagnosis not present

## 2022-11-23 DIAGNOSIS — Z7952 Long term (current) use of systemic steroids: Secondary | ICD-10-CM | POA: Diagnosis not present

## 2022-11-23 DIAGNOSIS — D849 Immunodeficiency, unspecified: Secondary | ICD-10-CM | POA: Diagnosis not present

## 2022-11-23 DIAGNOSIS — I081 Rheumatic disorders of both mitral and tricuspid valves: Secondary | ICD-10-CM | POA: Diagnosis not present

## 2022-11-27 DIAGNOSIS — M5416 Radiculopathy, lumbar region: Secondary | ICD-10-CM | POA: Diagnosis not present

## 2022-11-27 DIAGNOSIS — M5116 Intervertebral disc disorders with radiculopathy, lumbar region: Secondary | ICD-10-CM | POA: Diagnosis not present

## 2022-12-01 ENCOUNTER — Encounter (HOSPITAL_COMMUNITY): Payer: Self-pay | Admitting: Gastroenterology

## 2022-12-01 NOTE — Progress Notes (Signed)
Attempted to obtain medical history via telephone, unable to reach at this time. HIPAA compliant voicemail message left requesting return call to pre surgical testing department. 

## 2022-12-08 ENCOUNTER — Encounter (HOSPITAL_COMMUNITY): Payer: Self-pay | Admitting: Gastroenterology

## 2022-12-08 ENCOUNTER — Ambulatory Visit (HOSPITAL_BASED_OUTPATIENT_CLINIC_OR_DEPARTMENT_OTHER): Payer: Medicare Other | Admitting: Anesthesiology

## 2022-12-08 ENCOUNTER — Encounter (HOSPITAL_COMMUNITY)
Admission: RE | Disposition: A | Discharge status: Home / Self Care | Payer: Self-pay | Source: Home / Self Care | Attending: Gastroenterology

## 2022-12-08 ENCOUNTER — Ambulatory Visit (HOSPITAL_COMMUNITY)
Admission: RE | Admit: 2022-12-08 | Discharge: 2022-12-08 | Disposition: A | Discharge status: Home / Self Care | Payer: Medicare Other | Attending: Gastroenterology | Admitting: Gastroenterology

## 2022-12-08 ENCOUNTER — Other Ambulatory Visit: Payer: Self-pay

## 2022-12-08 ENCOUNTER — Ambulatory Visit (HOSPITAL_COMMUNITY): Payer: Medicare Other | Admitting: Anesthesiology

## 2022-12-08 DIAGNOSIS — K6389 Other specified diseases of intestine: Secondary | ICD-10-CM | POA: Diagnosis not present

## 2022-12-08 DIAGNOSIS — Z09 Encounter for follow-up examination after completed treatment for conditions other than malignant neoplasm: Secondary | ICD-10-CM | POA: Insufficient documentation

## 2022-12-08 DIAGNOSIS — R197 Diarrhea, unspecified: Secondary | ICD-10-CM | POA: Diagnosis not present

## 2022-12-08 DIAGNOSIS — K5520 Angiodysplasia of colon without hemorrhage: Secondary | ICD-10-CM | POA: Diagnosis not present

## 2022-12-08 DIAGNOSIS — I10 Essential (primary) hypertension: Secondary | ICD-10-CM

## 2022-12-08 DIAGNOSIS — Z8612 Personal history of poliomyelitis: Secondary | ICD-10-CM | POA: Diagnosis not present

## 2022-12-08 DIAGNOSIS — Z950 Presence of cardiac pacemaker: Secondary | ICD-10-CM | POA: Insufficient documentation

## 2022-12-08 DIAGNOSIS — K529 Noninfective gastroenteritis and colitis, unspecified: Secondary | ICD-10-CM

## 2022-12-08 DIAGNOSIS — R194 Change in bowel habit: Secondary | ICD-10-CM | POA: Diagnosis not present

## 2022-12-08 DIAGNOSIS — Z941 Heart transplant status: Secondary | ICD-10-CM | POA: Insufficient documentation

## 2022-12-08 HISTORY — PX: COLONOSCOPY WITH PROPOFOL: SHX5780

## 2022-12-08 HISTORY — PX: BIOPSY: SHX5522

## 2022-12-08 SURGERY — COLONOSCOPY WITH PROPOFOL
Anesthesia: Monitor Anesthesia Care

## 2022-12-08 MED ORDER — PROPOFOL 500 MG/50ML IV EMUL
INTRAVENOUS | Status: DC | PRN
  Administered 2022-12-08: 75 ug/kg/min via INTRAVENOUS

## 2022-12-08 MED ORDER — LACTATED RINGERS IV SOLN
INTRAVENOUS | Status: AC | PRN
  Administered 2022-12-08: 10 mL/h via INTRAVENOUS

## 2022-12-08 MED ORDER — PROPOFOL 10 MG/ML IV BOLUS
INTRAVENOUS | Status: DC | PRN
  Administered 2022-12-08: 25 mg via INTRAVENOUS
  Administered 2022-12-08 (×4): 40 mg via INTRAVENOUS
  Administered 2022-12-08: 25 mg via INTRAVENOUS
  Administered 2022-12-08: 40 mg via INTRAVENOUS

## 2022-12-08 MED ORDER — SODIUM CHLORIDE 0.9 % IV SOLN: INTRAVENOUS | Status: DC

## 2022-12-08 MED ORDER — LIDOCAINE 2% (20 MG/ML) 5 ML SYRINGE
INTRAMUSCULAR | Status: DC | PRN
  Administered 2022-12-08: 60 mg via INTRAVENOUS

## 2022-12-08 SURGICAL SUPPLY — 22 items

## 2022-12-08 NOTE — Anesthesia Procedure Notes (Signed)
Procedure Name: MAC Date/Time: 12/08/2022 9:35 AM  Performed by: Renato Shin, CRNAPre-anesthesia Checklist: Patient identified, Emergency Drugs available, Suction available and Patient being monitored Patient Re-evaluated:Patient Re-evaluated prior to induction Oxygen Delivery Method: Nasal cannula Preoxygenation: Pre-oxygenation with 100% oxygen Induction Type: IV induction Placement Confirmation: positive ETCO2 and breath sounds checked- equal and bilateral Dental Injury: Teeth and Oropharynx as per pre-operative assessment

## 2022-12-08 NOTE — Transfer of Care (Signed)
Immediate Anesthesia Transfer of Care Note  Patient: Cassie Harvey  Procedure(s) Performed: COLONOSCOPY WITH PROPOFOL BIOPSY  Patient Location: PACU  Anesthesia Type:MAC  Level of Consciousness: drowsy and patient cooperative  Airway & Oxygen Therapy: Patient Spontanous Breathing and Patient connected to face mask oxygen  Post-op Assessment: Report given to RN and Post -op Vital signs reviewed and stable  Post vital signs: Reviewed and stable  Last Vitals:  Vitals Value Taken Time  BP 104/65 12/08/22 1003  Temp 36.3 C 12/08/22 1003  Pulse 63 12/08/22 1003  Resp 19 12/08/22 1003  SpO2 100 % 12/08/22 1003    Last Pain:  Vitals:   12/08/22 1003  TempSrc: Temporal  PainSc: 0-No pain         Complications: No notable events documented.

## 2022-12-08 NOTE — Discharge Instructions (Signed)

## 2022-12-08 NOTE — Anesthesia Preprocedure Evaluation (Signed)
Anesthesia Evaluation  Patient identified by MRN, date of birth, ID band Patient awake    Reviewed: Allergy & Precautions, H&P , NPO status , Patient's Chart, lab work & pertinent test results  Airway Mallampati: II  TM Distance: >3 FB Neck ROM: Full    Dental no notable dental hx.    Pulmonary neg pulmonary ROS   Pulmonary exam normal breath sounds clear to auscultation       Cardiovascular hypertension, Normal cardiovascular exam+ pacemaker  Rhythm:Regular Rate:Normal  Heart transplanted ([place]) 2004   Neuro/Psych H/O polio  Neuromuscular disease  negative psych ROS   GI/Hepatic negative GI ROS, Neg liver ROS,,,  Endo/Other  negative endocrine ROS    Renal/GU negative Renal ROS  negative genitourinary   Musculoskeletal negative musculoskeletal ROS (+)    Abdominal   Peds negative pediatric ROS (+)  Hematology negative hematology ROS (+)   Anesthesia Other Findings   Reproductive/Obstetrics negative OB ROS                             Anesthesia Physical Anesthesia Plan  ASA: 3  Anesthesia Plan: MAC   Post-op Pain Management:    Induction: Intravenous  PONV Risk Score and Plan:   Airway Management Planned: Simple Face Mask  Additional Equipment:   Intra-op Plan:   Post-operative Plan:   Informed Consent: I have reviewed the patients History and Physical, chart, labs and discussed the procedure including the risks, benefits and alternatives for the proposed anesthesia with the patient or authorized representative who has indicated his/her understanding and acceptance.     Dental advisory given  Plan Discussed with: CRNA and Surgeon  Anesthesia Plan Comments:        Anesthesia Quick Evaluation

## 2022-12-08 NOTE — H&P (Signed)
Cassie Harvey HPI: This 77 year old white female presents to the office for further evaluation of changes in her bowel habits. She has post prandial diarrhea 2-3 times per week. The stools are not always watery but are softer than usual. She usually has 1-2 BM's every other day with occasional blood on the toilet tissue. She had some fecal soiling as well. She reports her cardiologist at [facility], Dr. Lattie Haw advised her to have one more surveillance colonoscopy. She has occasional nausea and vomiting which she relates to her antirejection drugs. She has a good appetite and her weight has been stable. She denies having any complaints of abdominal pain,  acid reflux, dysphagia or odynophagia. She denies having a family history of colon cancer, celiac sprue or IBD. Her last colonoscopy done on 01/12/2014 revealed patchy loss of vascular marking and random colonic biopsies were unremarkable with no evidence of colitis.   Past Medical History:  Diagnosis Date   Broken shoulder    right   Chronic pain    DDD (degenerative disc disease), lumbar    DJD (degenerative joint disease)    Family history of anesthesia complication    My father suffered from a dysrythmia   Heart transplanted [facility] 2004   History of poliomyelitis 10/15/2013   Hypertension    IBS (irritable bowel syndrome)    Immunosuppression ([place])    Molar pregnancy    Tx; with chemo   Mouth ulcers    Neuropathy, peripheral    Pacemaker 01/13/2003   Pneumonia    PONV (postoperative nausea and vomiting)    Urinary frequency    Wrist fracture, bilateral    Surgical repair left    Past Surgical History:  Procedure Laterality Date   APPENDECTOMY     CARDIAC CATHETERIZATION  2003   2015   COLONOSCOPY     COLONOSCOPY N/A 01/12/2014   Procedure: COLONOSCOPY;  Surgeon: Juanita Craver, MD;  Location: [facility];  Service: Endoscopy;  Laterality: N/A;   DILATION AND CURETTAGE OF UTERUS  1988   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   EAR  EXAMINATION UNDER ANESTHESIA  1977   lt middle ear    ELBOW ARTHROPLASTY     rt   HEART TRANSPLANT  11/2002   INJECTION KNEE Left 01/16/2013   Procedure: KNEE INJECTION;  Surgeon: [name], MD;  Location: [place];  Service: Orthopedics;  Laterality: Left;  STEROID    KNEE ARTHROSCOPY  1998   rt   KNEE ARTHROSCOPY WITH MEDIAL MENISECTOMY Right 01/16/2013   Procedure: RIGHT KNEE ARTHROSCOPY WITH MEDIAL AND LATERAL MENISECTOMY, REMOVAL LOOSE FOREIGN BODY AND STEROID INJECTION;  Surgeon: [name], MD;  Location: [place];  Service: Orthopedics;  Laterality: Right;  DEPRIEDMENT LATERAL MENISCUS AND CHONDROPLASTY   PACEMAKER INSERTION  [id number]   new dual chamber -medtronic 5076-art/vent leads   SEPTOPLASTY  1992   SHOULDER ARTHROSCOPY  96,02   right abd left   STERIOD INJECTION Left 01/16/2013   Procedure: STEROID INJECTION;  Surgeon: [name], MD;  Location: [place];  Service: Orthopedics;  Laterality: Left;   TOTAL HIP ARTHROPLASTY  02/2011   rt   TOTAL KNEE ARTHROPLASTY Right 02/25/2014   Procedure: RIGHT TOTAL KNEE ARTHROPLASTY;  Surgeon: [name], MD;  Location: [place];  Service: Orthopedics;  Laterality: Right;   TUBAL LIGATION  1990   WRIST ARTHROPLASTY  2008   left    Family History  Problem Relation  Age of Onset   Pulmonary fibrosis Father     Social History:  reports that she has never smoked. She has never used smokeless tobacco. She reports that she does not drink alcohol and does not use drugs.  Allergies:  Allergies  Allergen Reactions   Oxybutynin Shortness Of Breath   Bacitracin Swelling   Colchicine Diarrhea   Lisinopril Cough   Neomycin Swelling   Sulfa Antibiotics Rash   Sulfabenzamide Rash    Medications: Scheduled: Continuous:  sodium chloride     lactated ringers 10 mL/hr (12/08/22 0901)    No results found for this or any previous visit (from the past 24 hour(s)).   No  results found.  ROS:  As stated above in the HPI otherwise negative.  Weight 56.2 kg.    PE: Gen: NAD, Alert and Oriented HEENT:  [place]/AT, EOMI Neck: Supple, no LAD Lungs: CTA Bilaterally CV: RRR without M/G/R ABD: Soft, NTND, +BS Ext: No C/C/E  Assessment/Plan: 1) Change in bowel habits - colonoscopy with biopsies. 2) Diarrhea.  Cassie Harvey D 12/08/2022, 9:05 AM

## 2022-12-08 NOTE — Anesthesia Postprocedure Evaluation (Signed)
Anesthesia Post Note  Patient: Cassie Harvey  Procedure(s) Performed: COLONOSCOPY WITH PROPOFOL BIOPSY     Patient location during evaluation: PACU Anesthesia Type: MAC Level of consciousness: awake and alert Pain management: pain level controlled Vital Signs Assessment: post-procedure vital signs reviewed and stable Respiratory status: spontaneous breathing, nonlabored ventilation, respiratory function stable and patient connected to nasal cannula oxygen Cardiovascular status: stable and blood pressure returned to baseline Postop Assessment: no apparent nausea or vomiting Anesthetic complications: no  No notable events documented.  Last Vitals:  Vitals:   12/08/22 1010 12/08/22 1022  BP: 114/71 131/76  Pulse: 93 80  Resp: (!) 22   Temp:    SpO2: 100% 100%    Last Pain:  Vitals:   12/08/22 1022  TempSrc:   PainSc: 0-No pain                 Valton Schwartz S

## 2022-12-08 NOTE — Op Note (Addendum)
[facility] Patient Name: Cassie Harvey Procedure Date: 12/08/2022 MRN: [id number] Attending MD: Carol Ada , MD, [id number] Date of Birth: 1946-09-05 CSN: [id number] Age: 77 Admit Type: Outpatient Procedure:                Colonoscopy Indications:              Chronic diarrhea Providers:                Carol Ada, MD, Adah Perl RN, RN, Cherylynn Ridges, Rockney Ghee CRNA Referring MD:              Medicines:                Propofol per Anesthesia Complications:            No immediate complications. Estimated Blood Loss:     Estimated blood loss: none. Procedure:                Pre-Anesthesia Assessment:                           - Prior to the procedure, a History and Physical                            was performed, and patient medications and                            allergies were reviewed. The patient's tolerance of                            previous anesthesia was also reviewed. The risks                            and benefits of the procedure and the sedation                            options and risks were discussed with the patient.                            All questions were answered, and informed consent                            was obtained. Prior Anticoagulants: The patient has                            taken no anticoagulant or antiplatelet agents. ASA                            Grade Assessment: III - A patient with severe                            systemic disease. After reviewing the risks and  benefits, the patient was deemed in satisfactory                            condition to undergo the procedure.                           - Sedation was administered by an anesthesia                            professional. Deep sedation was attained.                           After obtaining informed consent, the colonoscope                            was passed under direct  vision. Throughout the                            procedure, the patient's blood pressure, pulse, and                            oxygen saturations were monitored continuously. The                            [id number] [id number]) Olympus colonoscope was                            introduced through the anus and advanced to the the                            cecum, identified by appendiceal orifice and                            ileocecal valve. The colonoscopy was performed with                            difficulty due to poor bowel prep, a redundant                            colon, significant looping and a tortuous colon.                            Successful completion of the procedure was aided by                            using manual pressure, straightening and shortening                            the scope to obtain bowel loop reduction and                            lavage. The patient tolerated the procedure well.  The quality of the bowel preparation was evaluated                            using the BBPS [facility] Bowel Preparation Scale)                            with scores of: Right Colon = 2 (minor amount of                            residual staining, small fragments of stool and/or                            opaque liquid, but mucosa seen well), Transverse                            Colon = 2 (minor amount of residual staining, small                            fragments of stool and/or opaque liquid, but mucosa                            seen well) and Left Colon = 2 (minor amount of                            residual staining, small fragments of stool and/or                            opaque liquid, but mucosa seen well). The total                            BBPS score equals 6. The quality of the bowel                            preparation was good. The ileocecal valve,                            appendiceal orifice, and rectum were  photographed. Scope In: 9:33:55 AM Scope Out: 9:59:03 AM Scope Withdrawal Time: 0 hours 10 minutes 45 seconds  Total Procedure Duration: 0 hours 25 minutes 8 seconds  Findings:      Normal mucosa was found in the entire colon. Biopsies for histology were       taken with a cold forceps from the entire colon for evaluation of       microscopic colitis.      A single large localized angiodysplastic lesion without bleeding was       found in the ascending colon. Impression:               - Normal mucosa in the entire examined colon.                            Biopsied.                           -  A single non-bleeding colonic angiodysplastic                            lesion. Moderate Sedation:      Not Applicable - Patient had care per Anesthesia. Recommendation:           - Patient has a contact number available for                            emergencies. The signs and symptoms of potential                            delayed complications were discussed with the                            patient. Return to normal activities tomorrow.                            Written discharge instructions were provided to the                            patient.                           - Resume previous diet.                           - Continue present medications.                           - Await pathology results.                           - Repeat colonoscopy is not recommended for                            surveillance. Procedure Code(s):        --- Professional ---                           [phone], Colonoscopy, flexible; with biopsy, single                            or multiple Diagnosis Code(s):        --- Professional ---                           K52.9, Noninfective gastroenteritis and colitis,                            unspecified                           K55.20, Angiodysplasia of colon without hemorrhage CPT copyright 2022 American Medical Association. All rights reserved. The  codes documented in this report are preliminary and upon coder review may  be revised to meet current compliance requirements. Carol Ada, MD Carol Ada, MD 12/08/2022 10:07:38 AM This report has been  signed electronically. Number of Addenda: 0

## 2022-12-11 LAB — SURGICAL PATHOLOGY

## 2022-12-12 DIAGNOSIS — D849 Immunodeficiency, unspecified: Secondary | ICD-10-CM | POA: Diagnosis not present

## 2022-12-12 DIAGNOSIS — Z79899 Other long term (current) drug therapy: Secondary | ICD-10-CM | POA: Diagnosis not present

## 2022-12-12 DIAGNOSIS — Z48298 Encounter for aftercare following other organ transplant: Secondary | ICD-10-CM | POA: Diagnosis not present

## 2022-12-12 DIAGNOSIS — Z941 Heart transplant status: Secondary | ICD-10-CM | POA: Diagnosis not present

## 2022-12-19 ENCOUNTER — Other Ambulatory Visit: Payer: Self-pay | Admitting: Neurology

## 2023-01-04 DIAGNOSIS — R140 Abdominal distension (gaseous): Secondary | ICD-10-CM | POA: Diagnosis not present

## 2023-01-04 DIAGNOSIS — K8020 Calculus of gallbladder without cholecystitis without obstruction: Secondary | ICD-10-CM | POA: Diagnosis not present

## 2023-01-04 DIAGNOSIS — R110 Nausea: Secondary | ICD-10-CM | POA: Diagnosis not present

## 2023-01-09 ENCOUNTER — Ambulatory Visit (INDEPENDENT_AMBULATORY_CARE_PROVIDER_SITE_OTHER): Payer: Medicare Other | Admitting: Neurology

## 2023-01-09 ENCOUNTER — Encounter: Payer: Self-pay | Admitting: Neurology

## 2023-01-09 VITALS — BP 146/68 | HR 56 | Ht 65.0 in | Wt 129.0 lb

## 2023-01-09 DIAGNOSIS — Z950 Presence of cardiac pacemaker: Secondary | ICD-10-CM

## 2023-01-09 DIAGNOSIS — G629 Polyneuropathy, unspecified: Secondary | ICD-10-CM | POA: Diagnosis not present

## 2023-01-09 DIAGNOSIS — R2689 Other abnormalities of gait and mobility: Secondary | ICD-10-CM

## 2023-01-09 MED ORDER — DULOXETINE HCL 30 MG PO CPEP: ORAL_CAPSULE | ORAL | 3 refills | Status: DC

## 2023-01-09 NOTE — Patient Instructions (Signed)
I will refill your Cymbalta, return here as needed, your primary care doctor can refill going forward

## 2023-01-09 NOTE — Progress Notes (Signed)
Patient: Cassie Harvey Date of Birth: January 14, 1946  Reason for Visit: Follow up History from: Patient Primary Neurologist: Willis/[facility]an   ASSESSMENT AND PLAN 77 y.o. year old female   1.  History of polio 2.  Residual left leg weakness 3.  Gait abnormality 4.  Chronic low back pain 5.  Recurrent falls 6.  Current pacemaker  -Doing overall well, continue Cymbalta 30 mg at bedtime, refilled today, continue close follow up with PCP, can return here for new issues   Meds ordered this encounter  Medications   DULoxetine (CYMBALTA) 30 MG capsule    Sig: TAKE 1 CAPSULE(30 MG) BY MOUTH AT BEDTIME    Dispense:  90 capsule    Refill:  3    HISTORY OF PRESENT ILLNESS: Cassie Harvey is a 77 year old female with patient of Dr. Anne Hahn, history of polio with residual left leg weakness, chronic low back pain,   I reviewed and summarized the referring note. PMHX. HTN Polio, affecting left leg, left leg weakness. CAD   Heart Transplant in 2004,  cellcept  bid, neoral  bid. S/p pacemaker S/p Left leg DVT   She suffered polio as a child, with residual mild left leg weakness, but highly functioning doing very well on her life  She suffered severe coronary artery disease, heart failure, had heart transplant at [facility] in 2004, continuing to do very well, had pacemaker for irregular heart rate, left lower extremity DVT peri-heart transplant period of time, since then, she had increased left lower extremity weakness, also had left knee replacement, should not have increased the right knee pain, which has pose more gait difficulty   She has mild bilateral lower extremity feet paresthesia, corrugate diagnosis of peripheral neuropathy, EMG nerve conduction study in 2015 showed no significant abnormalities  She also has longstanding history of chronic low back pain,  I personally reviewed CT myelogram in March 2016, multilevel degenerative changes, facet arthropathy throughout the  lumbar region, most pronounced on the right at L1-2, and L4-5, on the left at L5-S1, but no significant central canal stenosis, no significant foraminal stenosis CT cervical spine showed mild curvature convex to the left, mild spondylosis, no significant canal and foraminal narrowing  CT head was unremarkable   She now complains of lightheadedness standing up, dizziness sensation, orthostatic blood pressure measurement showed no significant abnormality  Update July 04, 2022 SS: No pain in legs during the day, happens at night, burning in legs. Still on Cymbalta 30 mg daily, moved it up to noon. Does feel sleepy in the morning. Has had a few falls, uses cane, lose balance, potential BP? Worried with 2 falls bumped her head, would like CT scan .Has had ESI with Dr. Danielle Dess for low back pain, last was few years ago, is going to call. Doesn't have PCP. Gets Prolia. Has pacemaker. Mentions memory, can be forgetful, mother had AD in her 54's. 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 Looks good today.   Update January 09, 2023 SS: at night her legs tingle, takes Cymbalta 30 mg at bedtime. Will take tylenol at night to help relax her. Needs left knee replacement. Having some balance problems. Uses cane. CT head was stable in November. Needs new battery for pacemaker.   REVIEW OF SYSTEMS: Out of a complete 14 system review of symptoms, the patient complains only of the following symptoms, and all other reviewed systems are negative.  See HPI  ALLERGIES: Allergies  Allergen Reactions  Oxybutynin Shortness Of Breath and Anaphylaxis   Sulfa Antibiotics Rash and Anaphylaxis   Bacitracin Swelling   Colchicine Diarrhea   Lisinopril Cough   Neomycin Swelling    Other Reaction(s): allergy testing   Sulfabenzamide Rash    HOME MEDICATIONS: Outpatient Medications Prior to Visit  Medication Sig Dispense Refill   acetaminophen (TYLENOL) 500 MG tablet Take 500-1,000 mg by  mouth every 6 (six) hours as needed for moderate pain.     allopurinol (ZYLOPRIM) 300 MG tablet Take 300 mg by mouth daily.     amoxicillin (AMOXIL) 500 MG capsule Take 2,000 mg by mouth See admin instructions. Take 1 hour prior to dental work     Ascorbic Acid (VITAMIN C) 1000 MG tablet Take 1,000 mg by mouth daily.     ascorbic acid (VITAMIN C) 500 MG tablet Take 500 mg by mouth daily.     ASPIRIN 81 PO Take 81 mg by mouth every evening.     B Complex-C (SUPER B COMPLEX PO) Take 1 tablet by mouth daily.     Calcium Carbonate-Vit D-Min (GNP CALCIUM PLUS 600 +D) 600-200 MG-UNIT TABS Take 1 tablet by mouth daily.     carvedilol (COREG) 3.125 MG tablet Take 3.125 mg by mouth 2 (two) times daily with a meal.     Cholecalciferol (VITAMIN D) 50 MCG (2000 UT) tablet Take 2,000-4,000 Units by mouth See admin instructions. Take 4000 units in the morning and 2000 units in the evening     Cranberry-Vitamin C (CRANBERRY CONCENTRATE/VITAMINC PO) Take 1 capsule by mouth 2 (two) times daily.     cycloSPORINE modified (NEORAL) 25 MG capsule Take 50 mg by mouth 2 (two) times daily.     ezetimibe (ZETIA) 10 MG tablet Take 10 mg by mouth every evening.     fluorouracil (EFUDEX) 5 % cream Apply 1 Application topically daily as needed (precancerous spots).     folic acid (FOLVITE) 1 MG tablet Take 1 mg by mouth daily.  10   Menatetrenone (VITAMIN K2) 100 MCG TABS Take 100 mcg by mouth daily.     Multiple Vitamin (MULTIVITAMIN WITH MINERALS) TABS tablet Take 1 tablet by mouth daily.     mycophenolate (CELLCEPT) 500 MG tablet Take 500 mg by mouth 2 (two) times daily.     niacin (VITAMIN B3) 250 MG tablet Take 250 mg by mouth every evening. Take with 500 mg to equal 750 mg daily     niacin (VITAMIN B3) 500 MG tablet Take 500 mg by mouth every evening. Take with 250 mg to equal 750 mg daily     Niacinamide-Zn-Cu-Methfo-Se-Cr (NICOTINAMIDE PO) Take 1 tablet by mouth 2 (two) times daily.     Omega-3 Fatty Acids (FISH  OIL) 1000 MG CAPS Take 1,000 mg by mouth 3 (three) times daily.     ondansetron (ZOFRAN-ODT) 8 MG disintegrating tablet Take 8 mg by mouth every 8 (eight) hours as needed for vomiting or nausea.     solifenacin (VESICARE) 10 MG tablet Take 10 mg by mouth every evening.     tretinoin (RETIN-A) 0.05 % cream Apply 1 Application topically at bedtime.     UNABLE TO FIND Take 1 capsule by mouth daily. metagenics-ultraflora IB 60BN CFu     valACYclovir (VALTREX) 500 MG tablet Take 500 mg by mouth daily with lunch.     Vibegron (GEMTESA) 75 MG TABS Take 75 mg by mouth every evening.     DULoxetine (CYMBALTA) 30 MG capsule TAKE 1 CAPSULE(30  MG) BY MOUTH AT BEDTIME 90 capsule 3   No facility-administered medications prior to visit.    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   Hypertension    IBS (irritable bowel syndrome)    Immunosuppression    Molar pregnancy    Tx; with chemo   Mouth ulcers    Neuropathy, peripheral    Pacemaker 01/13/2003   Pneumonia    PONV (postoperative nausea and vomiting)    Urinary frequency    Wrist fracture, bilateral    Surgical repair left    PAST SURGICAL HISTORY: Past Surgical History:  Procedure Laterality Date   APPENDECTOMY     BIOPSY  12/08/2022   Procedure: BIOPSY;  Surgeon: Jeani Hawking, MD;  [facility];  Service: Gastroenterology;;   CARDIAC CATHETERIZATION  2003   2015   COLONOSCOPY     COLONOSCOPY N/A 01/12/2014   Procedure: COLONOSCOPY;  Surgeon: Charna Elizabeth, MD;  [facility];  Service: Endoscopy;  Laterality: N/A;   COLONOSCOPY WITH PROPOFOL N/A 12/08/2022   Procedure: COLONOSCOPY WITH PROPOFOL;  Surgeon: Jeani Hawking, MD;   [facility];  Service: Gastroenterology;  Laterality: N/A;   DILATION AND CURETTAGE OF UTERUS  1988   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   EAR EXAMINATION UNDER ANESTHESIA  1977   lt middle ear    ELBOW ARTHROPLASTY     rt   HEART TRANSPLANT  11/2002   INJECTION KNEE Left 01/16/2013   Procedure: KNEE INJECTION;  Surgeon: Loreta Ave, MD;  Location: [facility];  Service: Orthopedics;  Laterality: Left;  STEROID    KNEE ARTHROSCOPY  1998   rt   KNEE ARTHROSCOPY WITH MEDIAL MENISECTOMY Right 01/16/2013   Procedure: RIGHT KNEE ARTHROSCOPY WITH MEDIAL AND LATERAL MENISECTOMY, REMOVAL LOOSE FOREIGN BODY AND STEROID INJECTION;  Surgeon: Loreta Ave, MD;  Location: [facility];  Service: Orthopedics;  Laterality: Right;  DEPRIEDMENT LATERAL MENISCUS AND CHONDROPLASTY   PACEMAKER INSERTION  2130,8657   new dual chamber -medtronic 5076-art/vent leads   SEPTOPLASTY  1992   SHOULDER ARTHROSCOPY  96,02   right abd left   STERIOD INJECTION Left 01/16/2013   Procedure: STEROID INJECTION;  Surgeon: Loreta Ave, MD;  Location: [facility];  Service: Orthopedics;  Laterality: Left;   TOTAL HIP ARTHROPLASTY  02/2011   rt   TOTAL KNEE ARTHROPLASTY Right 02/25/2014   Procedure: RIGHT TOTAL KNEE ARTHROPLASTY;  Surgeon: Loreta Ave, MD;  Location: [facility];  Service: Orthopedics;  Laterality: Right;   TUBAL LIGATION  1990   WRIST ARTHROPLASTY  2008   left    FAMILY HISTORY: Family History  Problem Relation Age of Onset   Pulmonary fibrosis Father     SOCIAL HISTORY: Social History   Socioeconomic History   Marital status: Married    Spouse name: Not on file   Number of children: 0   Years of education: college   Highest education level: Not on file  Occupational History   Occupation: Retired  Tobacco Use   Smoking status: Never   Smokeless tobacco: Never  Substance and Sexual Activity   Alcohol use: No  Drug use: No   Sexual  activity: Yes  Other Topics Concern   Not on file  Social History Narrative   Not on file   Social Determinants of Health   Financial Resource Strain: Not on file  Food Insecurity: Not on file  Transportation Needs: Not on file  Physical Activity: Not on file  Stress: Not on file  Social Connections: Not on file  Intimate Partner Violence: Not on file   PHYSICAL EXAM  Vitals:   01/09/23 1439  BP: (!) 146/68  Pulse: (!) 56  Weight: 129 lb (58.5 kg)  Height:  (1.651 m)    Body mass index is 21.47 kg/m.  Generalized: Well developed, in no acute distress  Neurological examination  Mentation: Alert oriented to time, place, history taking. Follows all commands speech and language fluent Cranial nerve II-XII: Pupils were equal round reactive to light. Extraocular movements were full, visual field were full on confrontational test. Facial sensation and strength were normal. Head turning and shoulder shrug  were normal and symmetric. Motor: 3/5 left hip flexion weakness, 4/5 left knee extension and flexion Sensory: Sensory testing is intact to soft touch on all 4 extremities. No evidence of extinction is noted.  Coordination: Cerebellar testing reveals good finger-nose-finger and heel-to-shin bilaterally.  Gait and station: Gait is wide-based, cautious, left leg straight, drags Reflexes: Deep tendon reflexes are symmetric but decreased  DIAGNOSTIC DATA (LABS, IMAGING, TESTING) - I reviewed patient records, labs, notes, testing and imaging myself where available.  Lab Results  Component Value Date   WBC 12.5 (H) 10/12/2020   HGB 11.6 10/12/2020   HCT 33.9 (L) 10/12/2020   MCV 103 (H) 10/12/2020   PLT 395 10/12/2020      Component Value Date/Time   NA 140 10/12/2020 1346   K 4.8 10/12/2020 1346   CL 102 10/12/2020 1346   CO2 20 10/12/2020 1346   GLUCOSE 96 10/12/2020 1346   GLUCOSE 114 (H) 02/26/2014 0535   BUN 28 (H) 10/12/2020 1346   CREATININE 1.65 (H)  10/12/2020 1346   CALCIUM 9.5 10/12/2020 1346   PROT 6.6 10/12/2020 1346   ALBUMIN 3.9 10/12/2020 1346   AST 18 10/12/2020 1346   ALT 7 10/12/2020 1346   ALKPHOS 51 10/12/2020 1346   BILITOT 0.3 10/12/2020 1346   GFRNONAA 31 (L) 10/12/2020 1346   GFRAA 35 (L) 10/12/2020 1346   No results found for: "CHOL", "HDL", "LDLCALC", "LDLDIRECT", "TRIG", "CHOLHDL" Lab Results  Component Value Date   HGBA1C 5.2 12/22/2021   Lab Results  Component Value Date   VITAMINB12 1,301 (H) 12/22/2021   Lab Results  Component Value Date   TSH 2.530 10/16/2013    Margie Ege, AGNP-C, DNP 01/09/2023, 3:03 PM [facility] [address], [address] [place], [place] [address] [phone]

## 2023-01-10 DIAGNOSIS — I4510 Unspecified right bundle-branch block: Secondary | ICD-10-CM | POA: Diagnosis not present

## 2023-01-10 DIAGNOSIS — Z941 Heart transplant status: Secondary | ICD-10-CM | POA: Diagnosis not present

## 2023-01-10 DIAGNOSIS — I495 Sick sinus syndrome: Secondary | ICD-10-CM | POA: Diagnosis not present

## 2023-01-10 DIAGNOSIS — Z79899 Other long term (current) drug therapy: Secondary | ICD-10-CM | POA: Diagnosis not present

## 2023-01-10 DIAGNOSIS — I499 Cardiac arrhythmia, unspecified: Secondary | ICD-10-CM | POA: Diagnosis not present

## 2023-01-10 DIAGNOSIS — R9431 Abnormal electrocardiogram [ECG] [EKG]: Secondary | ICD-10-CM | POA: Diagnosis not present

## 2023-01-10 DIAGNOSIS — Z45018 Encounter for adjustment and management of other part of cardiac pacemaker: Secondary | ICD-10-CM | POA: Diagnosis not present

## 2023-01-10 DIAGNOSIS — Z950 Presence of cardiac pacemaker: Secondary | ICD-10-CM | POA: Diagnosis not present

## 2023-01-17 DIAGNOSIS — M1009 Idiopathic gout, multiple sites: Secondary | ICD-10-CM | POA: Diagnosis not present

## 2023-01-17 DIAGNOSIS — Z6821 Body mass index (BMI) 21.0-21.9, adult: Secondary | ICD-10-CM | POA: Diagnosis not present

## 2023-01-17 DIAGNOSIS — M1991 Primary osteoarthritis, unspecified site: Secondary | ICD-10-CM | POA: Diagnosis not present

## 2023-01-17 DIAGNOSIS — Z79899 Other long term (current) drug therapy: Secondary | ICD-10-CM | POA: Diagnosis not present

## 2023-01-18 DIAGNOSIS — N3000 Acute cystitis without hematuria: Secondary | ICD-10-CM | POA: Diagnosis not present

## 2023-01-18 LAB — LAB REPORT - SCANNED: EGFR: 37

## 2023-01-25 DIAGNOSIS — Q828 Other specified congenital malformations of skin: Secondary | ICD-10-CM | POA: Diagnosis not present

## 2023-01-25 DIAGNOSIS — D492 Neoplasm of unspecified behavior of bone, soft tissue, and skin: Secondary | ICD-10-CM | POA: Diagnosis not present

## 2023-01-25 DIAGNOSIS — Z85828 Personal history of other malignant neoplasm of skin: Secondary | ICD-10-CM | POA: Diagnosis not present

## 2023-01-25 DIAGNOSIS — L219 Seborrheic dermatitis, unspecified: Secondary | ICD-10-CM | POA: Diagnosis not present

## 2023-01-25 DIAGNOSIS — L570 Actinic keratosis: Secondary | ICD-10-CM | POA: Diagnosis not present

## 2023-01-25 DIAGNOSIS — D849 Immunodeficiency, unspecified: Secondary | ICD-10-CM | POA: Diagnosis not present

## 2023-01-25 DIAGNOSIS — L821 Other seborrheic keratosis: Secondary | ICD-10-CM | POA: Diagnosis not present

## 2023-01-25 DIAGNOSIS — D485 Neoplasm of uncertain behavior of skin: Secondary | ICD-10-CM | POA: Diagnosis not present

## 2023-02-13 DIAGNOSIS — N3941 Urge incontinence: Secondary | ICD-10-CM | POA: Diagnosis not present

## 2023-02-13 DIAGNOSIS — R8271 Bacteriuria: Secondary | ICD-10-CM | POA: Diagnosis not present

## 2023-02-14 ENCOUNTER — Ambulatory Visit (INDEPENDENT_AMBULATORY_CARE_PROVIDER_SITE_OTHER): Payer: Medicare Other | Admitting: Family Medicine

## 2023-02-14 VITALS — BP 138/65 | Ht 65.0 in | Wt 122.0 lb

## 2023-02-14 DIAGNOSIS — M810 Age-related osteoporosis without current pathological fracture: Secondary | ICD-10-CM | POA: Diagnosis not present

## 2023-02-14 NOTE — Patient Instructions (Signed)
You have osteoporosis. Continue your calcium and vitamin D. We will get records of your recent labwork and contact you about these - we may have to do some prior to your next prolia on/after 6/6.

## 2023-02-15 ENCOUNTER — Encounter: Payer: Self-pay | Admitting: Family Medicine

## 2023-02-15 DIAGNOSIS — R3915 Urgency of urination: Secondary | ICD-10-CM | POA: Diagnosis not present

## 2023-02-15 NOTE — Progress Notes (Addendum)
PCP: Jason Coop, MD  Subjective:   HPI: Patient is a 77 y.o. female here for osteoporosis.  Patient presents today for [place] to assume osteoporosis care from Dr. Cleophas Dunker.  She has taken prolia for years, tolerates well, and would like to continue. Prior treatment: Prolia - last given 08/29/22; took samples of tymlos but was cost prohibitive so did not use for long History of Hip, Spine, or Wrist Fracture: yes, wrist fracture with ORIF Heart disease or stroke: yes - has had cardiac transplant Cancer: no Kidney Disease: no Gastric/Peptic Ulcer: no Gastric bypass surgery: no Severe GERD: no History of seizures: no Age at Menopause: 46 Calcium intake: 600mg  daily Vitamin D intake: D3 2000 3x/day Hormone replacement therapy: no Smoking history: never Alcohol: none Exercise: sedentary Major dental work in past year: no Parents with hip/spine fracture: yes - father with hip fracture  Past Medical History:  Diagnosis Date   Broken shoulder    right   Chronic pain    DDD (degenerative disc disease), lumbar    DJD (degenerative joint disease)    Family history of anesthesia complication    My father suffered from a dysrythmia   Heart transplanted [facility]) 2004   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     allopurinol (ZYLOPRIM) 300 MG tablet Take 300 mg by mouth daily.     amoxicillin (AMOXIL) 500 MG capsule Take 2,000 mg by mouth See admin instructions. Take 1 hour  prior to dental work     Ascorbic Acid (VITAMIN C) 1000 MG tablet Take 1,000 mg by mouth daily.     ascorbic acid (VITAMIN C) 500 MG tablet Take 500 mg by mouth daily.     ASPIRIN 81 PO Take 81 mg by mouth every evening.     B Complex-C (SUPER B COMPLEX PO) Take 1 tablet by mouth daily.     Calcium Carbonate-Vit D-Min (GNP CALCIUM PLUS 600 +D) 600-200 MG-UNIT TABS Take 1 tablet by mouth daily.     carvedilol (COREG) 3.125 MG tablet Take 3.125 mg by mouth 2 (two) times daily with a meal.     Cholecalciferol (VITAMIN D) 50 MCG (2000 UT) tablet Take 2,000-4,000 Units by mouth See admin instructions. Take 4000 units in the morning and 2000 units in the evening     Cranberry-Vitamin C (CRANBERRY CONCENTRATE/VITAMINC PO) Take 1 capsule by mouth 2 (two) times daily.     cycloSPORINE modified (NEORAL) 25 MG capsule Take 50 mg by mouth 2 (two) times daily.     DULoxetine (CYMBALTA) 30 MG capsule TAKE 1 CAPSULE(30 MG) BY MOUTH AT BEDTIME 90 capsule 3   ezetimibe (ZETIA) 10 MG tablet Take 10 mg by mouth every evening.     fluorouracil (EFUDEX) 5 % cream Apply 1 Application topically daily as needed (precancerous spots).     folic acid (FOLVITE) 1 MG tablet Take 1 mg by mouth  daily.  10   Menatetrenone (VITAMIN K2) 100 MCG TABS Take 100 mcg by mouth daily.     Multiple Vitamin (MULTIVITAMIN WITH MINERALS) TABS tablet Take 1 tablet by mouth daily.     mycophenolate (CELLCEPT) 500 MG tablet Take 500 mg by mouth 2 (two) times daily.     niacin (VITAMIN B3) 250 MG tablet Take 250 mg by mouth every evening. Take with 500 mg to equal 750 mg daily     niacin (VITAMIN B3) 500 MG tablet Take 500 mg by mouth every evening. Take with 250 mg to equal 750 mg daily     Niacinamide-Zn-Cu-Methfo-Se-Cr (NICOTINAMIDE PO) Take 1 tablet by mouth 2 (two) times daily.     Omega-3 Fatty Acids (FISH OIL) 1000 MG CAPS Take 1,000 mg by mouth 3 (three) times daily.     ondansetron (ZOFRAN-ODT) 8 MG disintegrating tablet Take 8 mg by  mouth every 8 (eight) hours as needed for vomiting or nausea.     solifenacin (VESICARE) 10 MG tablet Take 10 mg by mouth every evening.     tretinoin (RETIN-A) 0.05 % cream Apply 1 Application topically at bedtime.     UNABLE TO FIND Take 1 capsule by mouth daily. metagenics-ultraflora IB 60BN CFu     valACYclovir (VALTREX) 500 MG tablet Take 500 mg by mouth daily with lunch.     Vibegron (GEMTESA) 75 MG TABS Take 75 mg by mouth every evening.     No current facility-administered medications on file prior to visit.    Past Surgical History:  Procedure Laterality Date   APPENDECTOMY     BIOPSY  12/08/2022   Procedure: BIOPSY;  Surgeon: Jeani Hawking, MD;  Location: [facility];  Service: Gastroenterology;;   CARDIAC CATHETERIZATION  2003   2015   COLONOSCOPY     COLONOSCOPY N/A 01/12/2014   Procedure: COLONOSCOPY;  Surgeon: Charna Elizabeth, MD;  Location: [facility];  Service: Endoscopy;  Laterality: N/A;   COLONOSCOPY WITH PROPOFOL N/A 12/08/2022   Procedure: COLONOSCOPY WITH PROPOFOL;  Surgeon: Jeani Hawking, MD;  Location: [facility];  Service: Gastroenterology;  Laterality: N/A;   DILATION AND CURETTAGE OF UTERUS  1988   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   EAR EXAMINATION UNDER ANESTHESIA  1977   lt middle ear    ELBOW ARTHROPLASTY     rt   HEART TRANSPLANT  11/2002   INJECTION KNEE Left 01/16/2013   Procedure: KNEE INJECTION;  Surgeon: Loreta Ave, MD;  Location: [facility];  Service: Orthopedics;  Laterality: Left;  STEROID    KNEE ARTHROSCOPY  1998   rt   KNEE ARTHROSCOPY WITH MEDIAL MENISECTOMY Right 01/16/2013   Procedure: RIGHT KNEE ARTHROSCOPY WITH MEDIAL AND LATERAL MENISECTOMY, REMOVAL LOOSE FOREIGN BODY AND STEROID INJECTION;  Surgeon: Loreta Ave, MD;  Location: [facility];  Service: Orthopedics;  Laterality: Right;  DEPRIEDMENT LATERAL MENISCUS AND CHONDROPLASTY   PACEMAKER INSERTION  1610,9604   new dual chamber -medtronic  5076-art/vent leads   SEPTOPLASTY  1992   SHOULDER ARTHROSCOPY  96,02   right abd left   STERIOD INJECTION Left 01/16/2013   Procedure: STEROID INJECTION;  Surgeon: Loreta Ave, MD;  Location: [facility];  Service: Orthopedics;  Laterality: Left;   TOTAL HIP ARTHROPLASTY  02/2011   rt   TOTAL KNEE ARTHROPLASTY Right 02/25/2014   Procedure: RIGHT TOTAL KNEE ARTHROPLASTY;  Surgeon: Loreta Ave, MD;  Location: [facility];  Service: Orthopedics;  Laterality: Right;  TUBAL LIGATION  1990   WRIST ARTHROPLASTY  2008   left    Allergies  Allergen Reactions   Oxybutynin Shortness Of Breath and Anaphylaxis   Sulfa Antibiotics Rash and Anaphylaxis   Bacitracin Swelling   Colchicine Diarrhea   Lisinopril Cough   Neomycin Swelling    Other Reaction(s): allergy testing   Sulfabenzamide Rash    BP 138/65   Ht 5\' 5"  (1.651 m)   Wt 122 lb (55.3 kg)   BMI 20.30 kg/m       No data to display              No data to display              Objective:  Physical Exam:  Gen: NAD, comfortable in exam room  Dexa 08/21/22 T scores: L femur -2.8, R radius -2.5, Spine -0.3   Assessment & Plan:  1. Osteoporosis - history of osteoporotic wrist fracture s/p ORIF.  Tried tymlos but was cost-prohibitive and could not do evenity with cardiac history.  Has done well on Prolia so will continue - due 6/6.  Had labwork done recently - will get these records and see if she had Vitamin D and BMP done - if not will check prior to next prolia dose.  Encourage exercise.  Increase calcium supplementation to 1200mg , continue vitamin D.    Total visit time 35 minutes including documentation.  Addendum:  Labwork received and will be scanned into chart.  Calcium normal.  She needs vitamin D checked then can get prolia.

## 2023-02-20 DIAGNOSIS — Z950 Presence of cardiac pacemaker: Secondary | ICD-10-CM | POA: Diagnosis not present

## 2023-02-21 DIAGNOSIS — Z950 Presence of cardiac pacemaker: Secondary | ICD-10-CM | POA: Diagnosis not present

## 2023-03-05 ENCOUNTER — Encounter: Payer: Self-pay | Admitting: *Deleted

## 2023-03-05 ENCOUNTER — Other Ambulatory Visit: Payer: Self-pay | Admitting: *Deleted

## 2023-03-05 DIAGNOSIS — M810 Age-related osteoporosis without current pathological fracture: Secondary | ICD-10-CM

## 2023-03-08 DIAGNOSIS — M810 Age-related osteoporosis without current pathological fracture: Secondary | ICD-10-CM | POA: Diagnosis not present

## 2023-03-09 ENCOUNTER — Encounter: Payer: Self-pay | Admitting: *Deleted

## 2023-03-09 LAB — VITAMIN D 25 HYDROXY (VIT D DEFICIENCY, FRACTURES): Vit D, 25-Hydroxy: 64.8 ng/mL (ref 30.0–100.0)

## 2023-03-12 ENCOUNTER — Telehealth: Payer: Self-pay

## 2023-03-12 NOTE — Telephone Encounter (Signed)
Prolia VOB initiated via [email or web address]  Last OV:  Next OV:  Last Prolia inj:  Next Prolia inj DUE:   

## 2023-03-12 NOTE — Telephone Encounter (Signed)
From: Annita Brod, CMA  Sent: 03/09/2023  12:23 PM EDT  To: Dierdre Searles, CMA   Didn't know if I had sent you a message on this pt to start VOB for her prolia... if not, you can begin   thx

## 2023-03-20 NOTE — Telephone Encounter (Addendum)
Pt is ready for scheduling on or after 03/01/23  Out-of-pocket cost due at time of visit: $0  Primary: Medicare Prolia co-insurance: 20% (approximately $302) Admin fee co-insurance: 20% (approximately $25)  Deductible: $240 of $240 met  Secondary: AARP Medicare Supp Plan F Prolia co-insurance: Covers Medicare Part B co-insurance Admin fee co-insurance: Covers Medicare Part B co-insurance  Deductible:  Covered by secondary  Prior Auth: NOT required PA# Valid:   ** This summary of benefits is an estimation of the patient's out-of-pocket cost. Exact cost may vary based on individual plan coverage.

## 2023-03-21 ENCOUNTER — Encounter: Payer: Self-pay | Admitting: *Deleted

## 2023-03-27 ENCOUNTER — Ambulatory Visit (INDEPENDENT_AMBULATORY_CARE_PROVIDER_SITE_OTHER): Payer: Medicare Other | Admitting: Sports Medicine

## 2023-03-27 DIAGNOSIS — M810 Age-related osteoporosis without current pathological fracture: Secondary | ICD-10-CM

## 2023-03-27 MED ORDER — DENOSUMAB 60 MG/ML ~~LOC~~ SOSY
60.0000 mg | PREFILLED_SYRINGE | Freq: Once | SUBCUTANEOUS | Status: AC
  Administered 2023-03-27: 60 mg via SUBCUTANEOUS

## 2023-03-27 NOTE — Telephone Encounter (Signed)
Left message for patient to call back. Also advised pt she can discuss this further at her 03/27/23 1:30 visit with Dr. Darrick Penna if I don't speak to her prior to that visit.

## 2023-03-27 NOTE — Progress Notes (Unsigned)
Patient given [place] prolia injection 60mg /ml in the right lower abdomen. Patient tolerated injection well without reaction at the injection site. Patient will schedule next injection, which is 6 months from today (January 2025).

## 2023-03-27 NOTE — Patient Instructions (Signed)
You will be due for your next Prolia injection on/after Jan. 3rd, 2025 

## 2023-04-05 ENCOUNTER — Other Ambulatory Visit: Payer: Self-pay

## 2023-04-05 ENCOUNTER — Ambulatory Visit
Admission: RE | Admit: 2023-04-05 | Discharge: 2023-04-05 | Disposition: A | Discharge status: Home / Self Care | Payer: Medicare Other | Source: Ambulatory Visit | Attending: Sports Medicine | Admitting: Sports Medicine

## 2023-04-05 ENCOUNTER — Ambulatory Visit (INDEPENDENT_AMBULATORY_CARE_PROVIDER_SITE_OTHER): Payer: Medicare Other | Admitting: Sports Medicine

## 2023-04-05 VITALS — BP 144/82 | Ht 65.5 in | Wt 124.0 lb

## 2023-04-05 DIAGNOSIS — M25562 Pain in left knee: Secondary | ICD-10-CM | POA: Diagnosis not present

## 2023-04-05 DIAGNOSIS — M1712 Unilateral primary osteoarthritis, left knee: Secondary | ICD-10-CM | POA: Diagnosis not present

## 2023-04-05 NOTE — Telephone Encounter (Addendum)
Last Prolia inj 03/27/23 Next Prolia inj due 09/28/23 

## 2023-04-05 NOTE — Progress Notes (Signed)
Chief complaint left knee pain  Patient seems to have had a history of hypermobility most of her life She has already had a right knee replacement She has had bilateral shoulder surgeries She has had right elbow surgery Currently her left knee feels unstable and she has actually had about 4 times when it led her to fall She is not sure that the knee instability and creates a falling because these usually occur with episodes of dizziness However the knee does not feel stable and at 1 point before he died Dr. Eulah Pont had told her he felt that this knee would have to be replaced as well She has had no x-rays of her left knee for at least 6 years  Note that the patient is now 20 years status post heart transplant She has done very well and has generally stayed active  She would like to be more active but feels that she needs to walk with a cane is unsure how much she can do on her left knee  Physical exam Pleasant older female in no acute distress BP (!) 144/82   Ht 5' 5.5" (1.664 m)   Wt 124 lb (56.2 kg)   BMI 20.32 kg/m  Blood pressure rechecked  Patient's left knee shows hypermobility She gets hyperextension She can flex to about 160 degrees She has no pain with McMurray's Her ligaments feel loose but with endpoints She has quadriceps weakness In the muscles of the lower leg also appear weak  Ultrasound of the left knee No effusion is noted in the suprapatellar pouch Quadriceps tendon is intact Patellar tendon looks like it has some irregularity and probably a partial tear Lateral meniscus is somewhat extruded and there is some spurring on the lateral joint line with loss of joint space Medial joint line space is improved but in some areas the meniscus is extruded somewhat Trochlear groove looks unremarkable  Impression: Probable degenerative meniscal changes medially and laterally with some arthritis in the lateral compartment  Ultrasound and interpretation by Royal Hawthorn B.  Darrick Penna, MD

## 2023-05-22 DIAGNOSIS — T3695XA Adverse effect of unspecified systemic antibiotic, initial encounter: Secondary | ICD-10-CM | POA: Diagnosis not present

## 2023-05-22 DIAGNOSIS — B379 Candidiasis, unspecified: Secondary | ICD-10-CM | POA: Diagnosis not present

## 2023-05-22 DIAGNOSIS — N952 Postmenopausal atrophic vaginitis: Secondary | ICD-10-CM | POA: Diagnosis not present

## 2023-05-22 DIAGNOSIS — M6289 Other specified disorders of muscle: Secondary | ICD-10-CM | POA: Diagnosis not present

## 2023-05-22 DIAGNOSIS — R8271 Bacteriuria: Secondary | ICD-10-CM | POA: Diagnosis not present

## 2023-05-22 DIAGNOSIS — R3915 Urgency of urination: Secondary | ICD-10-CM | POA: Diagnosis not present

## 2023-05-22 DIAGNOSIS — N390 Urinary tract infection, site not specified: Secondary | ICD-10-CM | POA: Diagnosis not present

## 2023-05-28 DIAGNOSIS — I495 Sick sinus syndrome: Secondary | ICD-10-CM | POA: Diagnosis not present

## 2023-05-28 DIAGNOSIS — Z45018 Encounter for adjustment and management of other part of cardiac pacemaker: Secondary | ICD-10-CM | POA: Diagnosis not present

## 2023-05-28 DIAGNOSIS — R42 Dizziness and giddiness: Secondary | ICD-10-CM | POA: Diagnosis not present

## 2023-05-28 DIAGNOSIS — Z79891 Long term (current) use of opiate analgesic: Secondary | ICD-10-CM | POA: Diagnosis not present

## 2023-05-28 DIAGNOSIS — Z950 Presence of cardiac pacemaker: Secondary | ICD-10-CM | POA: Diagnosis not present

## 2023-05-28 DIAGNOSIS — Z79899 Other long term (current) drug therapy: Secondary | ICD-10-CM | POA: Diagnosis not present

## 2023-05-28 DIAGNOSIS — E7800 Pure hypercholesterolemia, unspecified: Secondary | ICD-10-CM | POA: Diagnosis not present

## 2023-06-18 DIAGNOSIS — R8271 Bacteriuria: Secondary | ICD-10-CM | POA: Diagnosis not present

## 2023-06-24 DIAGNOSIS — Z23 Encounter for immunization: Secondary | ICD-10-CM | POA: Diagnosis not present

## 2023-07-16 DIAGNOSIS — Z45018 Encounter for adjustment and management of other part of cardiac pacemaker: Secondary | ICD-10-CM | POA: Diagnosis not present

## 2023-07-16 DIAGNOSIS — Z79891 Long term (current) use of opiate analgesic: Secondary | ICD-10-CM | POA: Diagnosis not present

## 2023-07-16 DIAGNOSIS — I495 Sick sinus syndrome: Secondary | ICD-10-CM | POA: Diagnosis not present

## 2023-07-16 DIAGNOSIS — Z79899 Other long term (current) drug therapy: Secondary | ICD-10-CM | POA: Diagnosis not present

## 2023-07-16 DIAGNOSIS — Z941 Heart transplant status: Secondary | ICD-10-CM | POA: Diagnosis not present

## 2023-07-16 DIAGNOSIS — Z950 Presence of cardiac pacemaker: Secondary | ICD-10-CM | POA: Diagnosis not present

## 2023-07-16 DIAGNOSIS — E7800 Pure hypercholesterolemia, unspecified: Secondary | ICD-10-CM | POA: Diagnosis not present

## 2023-07-16 DIAGNOSIS — R42 Dizziness and giddiness: Secondary | ICD-10-CM | POA: Diagnosis not present

## 2023-07-16 DIAGNOSIS — I471 Supraventricular tachycardia, unspecified: Secondary | ICD-10-CM | POA: Diagnosis not present

## 2023-07-16 DIAGNOSIS — M25569 Pain in unspecified knee: Secondary | ICD-10-CM | POA: Diagnosis not present

## 2023-07-18 DIAGNOSIS — M1009 Idiopathic gout, multiple sites: Secondary | ICD-10-CM | POA: Diagnosis not present

## 2023-07-18 DIAGNOSIS — M1991 Primary osteoarthritis, unspecified site: Secondary | ICD-10-CM | POA: Diagnosis not present

## 2023-07-18 DIAGNOSIS — Z941 Heart transplant status: Secondary | ICD-10-CM | POA: Diagnosis not present

## 2023-07-18 DIAGNOSIS — Z79899 Other long term (current) drug therapy: Secondary | ICD-10-CM | POA: Diagnosis not present

## 2023-07-18 DIAGNOSIS — R42 Dizziness and giddiness: Secondary | ICD-10-CM | POA: Diagnosis not present

## 2023-07-18 DIAGNOSIS — Z6821 Body mass index (BMI) 21.0-21.9, adult: Secondary | ICD-10-CM | POA: Diagnosis not present

## 2023-08-02 DIAGNOSIS — L300 Nummular dermatitis: Secondary | ICD-10-CM | POA: Diagnosis not present

## 2023-08-02 DIAGNOSIS — D849 Immunodeficiency, unspecified: Secondary | ICD-10-CM | POA: Diagnosis not present

## 2023-08-02 DIAGNOSIS — Z85828 Personal history of other malignant neoplasm of skin: Secondary | ICD-10-CM | POA: Diagnosis not present

## 2023-08-02 DIAGNOSIS — D485 Neoplasm of uncertain behavior of skin: Secondary | ICD-10-CM | POA: Diagnosis not present

## 2023-08-02 DIAGNOSIS — L219 Seborrheic dermatitis, unspecified: Secondary | ICD-10-CM | POA: Diagnosis not present

## 2023-08-02 DIAGNOSIS — K130 Diseases of lips: Secondary | ICD-10-CM | POA: Diagnosis not present

## 2023-08-02 DIAGNOSIS — L821 Other seborrheic keratosis: Secondary | ICD-10-CM | POA: Diagnosis not present

## 2023-08-02 DIAGNOSIS — Q828 Other specified congenital malformations of skin: Secondary | ICD-10-CM | POA: Diagnosis not present

## 2023-08-13 DIAGNOSIS — N390 Urinary tract infection, site not specified: Secondary | ICD-10-CM | POA: Diagnosis not present

## 2023-08-13 DIAGNOSIS — N3941 Urge incontinence: Secondary | ICD-10-CM | POA: Diagnosis not present

## 2023-08-13 DIAGNOSIS — N952 Postmenopausal atrophic vaginitis: Secondary | ICD-10-CM | POA: Diagnosis not present

## 2023-08-13 DIAGNOSIS — R8271 Bacteriuria: Secondary | ICD-10-CM | POA: Diagnosis not present

## 2023-08-30 ENCOUNTER — Ambulatory Visit (INDEPENDENT_AMBULATORY_CARE_PROVIDER_SITE_OTHER): Payer: Medicare Other | Admitting: Podiatry

## 2023-08-30 DIAGNOSIS — L603 Nail dystrophy: Secondary | ICD-10-CM

## 2023-09-02 NOTE — Progress Notes (Signed)
This patient did not want a wait so she left.

## 2023-09-15 NOTE — Telephone Encounter (Signed)
Prolia VOB initiated via [email or web address]  Next Prolia inj DUE: 09/28/23  [employer] will not be running benefits between 09/26/23-10/02/23. Scheduled to run benefits 10/03/23. Will need to hold off on Prolia injection until benefits are determined.

## 2023-09-29 NOTE — Telephone Encounter (Addendum)
 Patient is ready for scheduling on or after 09/28/23 BUY AND BILL  Out-of-pocket cost due at time of visit: $0  Primary: Medicare Prolia  co-insurance: 20% (approximately $323.52) Admin fee co-insurance: 20% (approximately $25)  Deductible: $0 of $257 met  Prior Auth: NOT required  Secondary: AARP Medicare Supplement Plan F Prolia  co-insurance: Covers Medicare Part B co-insurance Admin fee co-insurance: Covers Medicare Part B co-insurance  Deductible:  Covered by secondary  Prior Auth: NOT required PA# Valid:   ** This summary of benefits is an estimation of the patient's out-of-pocket cost. Exact cost may vary based on individual plan coverage.

## 2023-09-29 NOTE — Telephone Encounter (Signed)
Prior Auth NOT required for [employer]

## 2023-10-05 ENCOUNTER — Encounter: Payer: Self-pay | Admitting: *Deleted

## 2023-10-05 ENCOUNTER — Other Ambulatory Visit: Payer: Self-pay | Admitting: *Deleted

## 2023-10-05 DIAGNOSIS — M810 Age-related osteoporosis without current pathological fracture: Secondary | ICD-10-CM

## 2023-10-09 DIAGNOSIS — M810 Age-related osteoporosis without current pathological fracture: Secondary | ICD-10-CM | POA: Diagnosis not present

## 2023-10-10 LAB — BASIC METABOLIC PANEL
BUN/Creatinine Ratio: 18 (ref 12–28)
BUN: 28 mg/dL — ABNORMAL HIGH (ref 8–27)
CO2: 21 mmol/L (ref 20–29)
Calcium: 9.8 mg/dL (ref 8.7–10.3)
Chloride: 102 mmol/L (ref 96–106)
Creatinine, Ser: 1.57 mg/dL — ABNORMAL HIGH (ref 0.57–1.00)
Glucose: 94 mg/dL (ref 70–99)
Potassium: 4.2 mmol/L (ref 3.5–5.2)
Sodium: 141 mmol/L (ref 134–144)
eGFR: 34 mL/min/{1.73_m2} — ABNORMAL LOW (ref >59)

## 2023-10-10 LAB — VITAMIN D 25 HYDROXY (VIT D DEFICIENCY, FRACTURES): Vit D, 25-Hydroxy: 57.7 ng/mL (ref 30.0–100.0)

## 2023-10-11 ENCOUNTER — Ambulatory Visit: Payer: Medicare Other | Admitting: Sports Medicine

## 2023-10-11 ENCOUNTER — Ambulatory Visit (INDEPENDENT_AMBULATORY_CARE_PROVIDER_SITE_OTHER): Payer: Medicare Other | Admitting: Family Medicine

## 2023-10-11 DIAGNOSIS — M810 Age-related osteoporosis without current pathological fracture: Secondary | ICD-10-CM | POA: Diagnosis not present

## 2023-10-11 MED ORDER — DENOSUMAB 60 MG/ML ~~LOC~~ SOSY
60.0000 mg | PREFILLED_SYRINGE | Freq: Once | SUBCUTANEOUS | Status: AC
  Administered 2023-10-11: 60 mg via SUBCUTANEOUS

## 2023-10-11 NOTE — Progress Notes (Signed)
Patient given [place] prolia injection 60mg /ml in the right lower abdomen. Patient tolerated injection well without reaction at the injection site. Patient will schedule next injection, which is 6 months from today.

## 2023-10-15 NOTE — Telephone Encounter (Signed)
 Last Prolia inj 10/11/23 Next Prolia inj due 04/10/24

## 2023-10-26 DIAGNOSIS — H6123 Impacted cerumen, bilateral: Secondary | ICD-10-CM | POA: Diagnosis not present

## 2023-10-26 DIAGNOSIS — H9211 Otorrhea, right ear: Secondary | ICD-10-CM | POA: Diagnosis not present

## 2023-10-26 DIAGNOSIS — H95191 Other disorders following mastoidectomy, right ear: Secondary | ICD-10-CM | POA: Diagnosis not present

## 2023-10-26 DIAGNOSIS — H7321 Unspecified myringitis, right ear: Secondary | ICD-10-CM | POA: Diagnosis not present

## 2023-11-01 DIAGNOSIS — G629 Polyneuropathy, unspecified: Secondary | ICD-10-CM | POA: Diagnosis not present

## 2023-11-01 DIAGNOSIS — Z4821 Encounter for aftercare following heart transplant: Secondary | ICD-10-CM | POA: Diagnosis not present

## 2023-11-01 DIAGNOSIS — I083 Combined rheumatic disorders of mitral, aortic and tricuspid valves: Secondary | ICD-10-CM | POA: Diagnosis not present

## 2023-11-01 DIAGNOSIS — Z941 Heart transplant status: Secondary | ICD-10-CM | POA: Diagnosis not present

## 2023-11-01 DIAGNOSIS — D849 Immunodeficiency, unspecified: Secondary | ICD-10-CM | POA: Diagnosis not present

## 2023-11-01 DIAGNOSIS — Z79624 Long term (current) use of inhibitors of nucleotide synthesis: Secondary | ICD-10-CM | POA: Diagnosis not present

## 2023-11-01 DIAGNOSIS — I119 Hypertensive heart disease without heart failure: Secondary | ICD-10-CM | POA: Diagnosis not present

## 2023-11-01 DIAGNOSIS — D84821 Immunodeficiency due to drugs: Secondary | ICD-10-CM | POA: Diagnosis not present

## 2023-11-01 DIAGNOSIS — I495 Sick sinus syndrome: Secondary | ICD-10-CM | POA: Diagnosis not present

## 2023-11-01 DIAGNOSIS — Z48298 Encounter for aftercare following other organ transplant: Secondary | ICD-10-CM | POA: Diagnosis not present

## 2023-11-01 DIAGNOSIS — Z2989 Encounter for other specified prophylactic measures: Secondary | ICD-10-CM | POA: Diagnosis not present

## 2023-11-01 DIAGNOSIS — Z79899 Other long term (current) drug therapy: Secondary | ICD-10-CM | POA: Diagnosis not present

## 2023-11-01 DIAGNOSIS — Z87440 Personal history of urinary (tract) infections: Secondary | ICD-10-CM | POA: Diagnosis not present

## 2023-11-13 DIAGNOSIS — N390 Urinary tract infection, site not specified: Secondary | ICD-10-CM | POA: Diagnosis not present

## 2023-11-13 DIAGNOSIS — R3915 Urgency of urination: Secondary | ICD-10-CM | POA: Diagnosis not present

## 2023-11-13 DIAGNOSIS — M6289 Other specified disorders of muscle: Secondary | ICD-10-CM | POA: Diagnosis not present

## 2023-11-13 DIAGNOSIS — Z48298 Encounter for aftercare following other organ transplant: Secondary | ICD-10-CM | POA: Diagnosis not present

## 2023-11-13 DIAGNOSIS — Z941 Heart transplant status: Secondary | ICD-10-CM | POA: Diagnosis not present

## 2023-11-13 DIAGNOSIS — Z79899 Other long term (current) drug therapy: Secondary | ICD-10-CM | POA: Diagnosis not present

## 2023-11-13 DIAGNOSIS — D849 Immunodeficiency, unspecified: Secondary | ICD-10-CM | POA: Diagnosis not present

## 2023-11-13 DIAGNOSIS — R8271 Bacteriuria: Secondary | ICD-10-CM | POA: Diagnosis not present

## 2023-11-21 DIAGNOSIS — M47816 Spondylosis without myelopathy or radiculopathy, lumbar region: Secondary | ICD-10-CM | POA: Diagnosis not present

## 2023-11-21 DIAGNOSIS — M542 Cervicalgia: Secondary | ICD-10-CM | POA: Diagnosis not present

## 2023-11-27 DIAGNOSIS — M6281 Muscle weakness (generalized): Secondary | ICD-10-CM | POA: Diagnosis not present

## 2023-11-27 DIAGNOSIS — Z79891 Long term (current) use of opiate analgesic: Secondary | ICD-10-CM | POA: Diagnosis not present

## 2023-11-27 DIAGNOSIS — R42 Dizziness and giddiness: Secondary | ICD-10-CM | POA: Diagnosis not present

## 2023-11-27 DIAGNOSIS — Z79899 Other long term (current) drug therapy: Secondary | ICD-10-CM | POA: Diagnosis not present

## 2023-11-27 DIAGNOSIS — E7800 Pure hypercholesterolemia, unspecified: Secondary | ICD-10-CM | POA: Diagnosis not present

## 2023-11-27 DIAGNOSIS — I495 Sick sinus syndrome: Secondary | ICD-10-CM | POA: Diagnosis not present

## 2023-11-27 DIAGNOSIS — Z45018 Encounter for adjustment and management of other part of cardiac pacemaker: Secondary | ICD-10-CM | POA: Diagnosis not present

## 2023-12-05 DIAGNOSIS — D0472 Carcinoma in situ of skin of left lower limb, including hip: Secondary | ICD-10-CM | POA: Diagnosis not present

## 2023-12-07 DIAGNOSIS — M5416 Radiculopathy, lumbar region: Secondary | ICD-10-CM | POA: Diagnosis not present

## 2023-12-07 DIAGNOSIS — M5116 Intervertebral disc disorders with radiculopathy, lumbar region: Secondary | ICD-10-CM | POA: Diagnosis not present

## 2023-12-07 DIAGNOSIS — M5415 Radiculopathy, thoracolumbar region: Secondary | ICD-10-CM | POA: Diagnosis not present

## 2023-12-24 DIAGNOSIS — R82998 Other abnormal findings in urine: Secondary | ICD-10-CM | POA: Diagnosis not present

## 2023-12-24 DIAGNOSIS — R197 Diarrhea, unspecified: Secondary | ICD-10-CM | POA: Diagnosis not present

## 2023-12-24 DIAGNOSIS — B370 Candidal stomatitis: Secondary | ICD-10-CM | POA: Diagnosis not present

## 2023-12-26 DIAGNOSIS — D849 Immunodeficiency, unspecified: Secondary | ICD-10-CM | POA: Diagnosis not present

## 2023-12-26 DIAGNOSIS — D84821 Immunodeficiency due to drugs: Secondary | ICD-10-CM | POA: Diagnosis not present

## 2023-12-26 DIAGNOSIS — R197 Diarrhea, unspecified: Secondary | ICD-10-CM | POA: Diagnosis not present

## 2024-01-02 DIAGNOSIS — D849 Immunodeficiency, unspecified: Secondary | ICD-10-CM | POA: Diagnosis not present

## 2024-01-02 DIAGNOSIS — Z941 Heart transplant status: Secondary | ICD-10-CM | POA: Diagnosis not present

## 2024-01-02 DIAGNOSIS — Z48298 Encounter for aftercare following other organ transplant: Secondary | ICD-10-CM | POA: Diagnosis not present

## 2024-01-21 DIAGNOSIS — R197 Diarrhea, unspecified: Secondary | ICD-10-CM | POA: Diagnosis not present

## 2024-01-21 DIAGNOSIS — M1009 Idiopathic gout, multiple sites: Secondary | ICD-10-CM | POA: Diagnosis not present

## 2024-01-21 DIAGNOSIS — M1991 Primary osteoarthritis, unspecified site: Secondary | ICD-10-CM | POA: Diagnosis not present

## 2024-01-21 DIAGNOSIS — Z79899 Other long term (current) drug therapy: Secondary | ICD-10-CM | POA: Diagnosis not present

## 2024-01-21 DIAGNOSIS — Z681 Body mass index (BMI) 19 or less, adult: Secondary | ICD-10-CM | POA: Diagnosis not present

## 2024-01-22 DIAGNOSIS — N3281 Overactive bladder: Secondary | ICD-10-CM | POA: Diagnosis not present

## 2024-01-22 DIAGNOSIS — Z87440 Personal history of urinary (tract) infections: Secondary | ICD-10-CM | POA: Diagnosis not present

## 2024-02-13 ENCOUNTER — Encounter (HOSPITAL_COMMUNITY): Payer: Self-pay

## 2024-02-13 ENCOUNTER — Emergency Department (HOSPITAL_COMMUNITY)
Admission: EM | Admit: 2024-02-13 | Discharge: 2024-02-13 | Disposition: A | Discharge status: Home / Self Care | Attending: Emergency Medicine | Admitting: Emergency Medicine

## 2024-02-13 ENCOUNTER — Other Ambulatory Visit: Payer: Self-pay

## 2024-02-13 DIAGNOSIS — D72829 Elevated white blood cell count, unspecified: Secondary | ICD-10-CM | POA: Insufficient documentation

## 2024-02-13 DIAGNOSIS — N390 Urinary tract infection, site not specified: Secondary | ICD-10-CM | POA: Insufficient documentation

## 2024-02-13 DIAGNOSIS — R600 Localized edema: Secondary | ICD-10-CM | POA: Insufficient documentation

## 2024-02-13 DIAGNOSIS — N179 Acute kidney failure, unspecified: Secondary | ICD-10-CM | POA: Diagnosis not present

## 2024-02-13 DIAGNOSIS — R300 Dysuria: Secondary | ICD-10-CM | POA: Diagnosis present

## 2024-02-13 DIAGNOSIS — Z950 Presence of cardiac pacemaker: Secondary | ICD-10-CM | POA: Insufficient documentation

## 2024-02-13 LAB — CBC WITH DIFFERENTIAL/PLATELET
Abs Immature Granulocytes: 0.07 10*3/uL (ref 0.00–0.07)
Basophils Absolute: 0.1 10*3/uL (ref 0.0–0.1)
Basophils Relative: 1 %
Eosinophils Absolute: 0.1 10*3/uL (ref 0.0–0.5)
Eosinophils Relative: 1 %
HCT: 39.9 % (ref 36.0–46.0)
Hemoglobin: 12.8 g/dL (ref 12.0–15.0)
Immature Granulocytes: 1 %
Lymphocytes Relative: 11 %
Lymphs Abs: 1.2 10*3/uL (ref 0.7–4.0)
MCH: 35 pg — ABNORMAL HIGH (ref 26.0–34.0)
MCHC: 32.1 g/dL (ref 30.0–36.0)
MCV: 109 fL — ABNORMAL HIGH (ref 80.0–100.0)
Monocytes Absolute: 1 10*3/uL (ref 0.1–1.0)
Monocytes Relative: 9 %
Neutro Abs: 8.5 10*3/uL — ABNORMAL HIGH (ref 1.7–7.7)
Neutrophils Relative %: 77 %
Platelets: 378 10*3/uL (ref 150–400)
RBC: 3.66 MIL/uL — ABNORMAL LOW (ref 3.87–5.11)
RDW: 13.5 % (ref 11.5–15.5)
WBC: 11 10*3/uL — ABNORMAL HIGH (ref 4.0–10.5)
nRBC: 0 % (ref 0.0–0.2)

## 2024-02-13 LAB — COMPREHENSIVE METABOLIC PANEL WITH GFR
ALT: 24 U/L (ref 0–44)
AST: 31 U/L (ref 15–41)
Albumin: 3.6 g/dL (ref 3.5–5.0)
Alkaline Phosphatase: 50 U/L (ref 38–126)
Anion gap: 15 (ref 5–15)
BUN: 39 mg/dL — ABNORMAL HIGH (ref 8–23)
CO2: 17 mmol/L — ABNORMAL LOW (ref 22–32)
Calcium: 8.3 mg/dL — ABNORMAL LOW (ref 8.9–10.3)
Chloride: 107 mmol/L (ref 98–111)
Creatinine, Ser: 2.14 mg/dL — ABNORMAL HIGH (ref 0.44–1.00)
GFR, Estimated: 23 mL/min — ABNORMAL LOW (ref >60)
Glucose, Bld: 102 mg/dL — ABNORMAL HIGH (ref 70–99)
Potassium: 4.6 mmol/L (ref 3.5–5.1)
Sodium: 139 mmol/L (ref 135–145)
Total Bilirubin: 0.6 mg/dL (ref 0.0–1.2)
Total Protein: 6.7 g/dL (ref 6.5–8.1)

## 2024-02-13 LAB — URINALYSIS, ROUTINE W REFLEX MICROSCOPIC
Bilirubin Urine: NEGATIVE
Glucose, UA: NEGATIVE mg/dL
Ketones, ur: NEGATIVE mg/dL
Nitrite: NEGATIVE
Protein, ur: 100 mg/dL — AB
Specific Gravity, Urine: 1.014 (ref 1.005–1.030)
WBC, UA: >50 WBC/hpf (ref 0–5)
pH: 5 (ref 5.0–8.0)

## 2024-02-13 MED ORDER — LACTATED RINGERS IV BOLUS
1000.0000 mL | Freq: Once | INTRAVENOUS | Status: AC
  Administered 2024-02-13: 1000 mL via INTRAVENOUS

## 2024-02-13 MED ORDER — CEPHALEXIN 500 MG PO CAPS: 500.0000 mg | ORAL_CAPSULE | Freq: Four times a day (QID) | ORAL | 0 refills | Status: DC

## 2024-02-13 MED ORDER — SODIUM CHLORIDE 0.9 % IV SOLN
1.0000 g | Freq: Once | INTRAVENOUS | Status: AC
  Administered 2024-02-13: 1 g via INTRAVENOUS
  Filled 2024-02-13: qty 10

## 2024-02-13 NOTE — ED Provider Notes (Signed)
 [place] [facility] Provider Note   CSN: [id number] Arrival date & time: 02/13/24  1352     History  Chief Complaint  Patient presents with   Recurrent UTI    Cassie Harvey is a 78 y.o. female.  Patient is a 78 year old female with past medical history of overactive bladder with recurrent UTI, cardiac transplant on immunosuppression, sick sinus syndrome with pacemaker in place presenting to the emergency department with dysuria.  The patient states that she has had dysuria for about the last week.  She states that she has not noticed any blood in her urine.  She denies any associated abdominal pain or back pain, fevers.  Of note she states that she did have a diarrheal illness shortly prior to the symptoms starting.  States that she has had no vomiting or diarrhea since then.  Patient also reports that her husband is currently in the hospital and feels as though she may need more assistance with helping take care of herself at home.  The history is provided by the patient and a relative.       Home Medications Prior to Admission medications   Medication Sig Start Date End Date Taking? Authorizing Provider  cephALEXin (KEFLEX) 500 MG capsule Take 1 capsule (500 mg total) by mouth 4 (four) times daily. 02/13/24  Yes Nora Beal, Tamie Minteer K, DO  acetaminophen  (TYLENOL ) 500 MG tablet Take 500-1,000 mg by mouth every 6 (six) hours as needed for moderate pain.    [provider]  allopurinol (ZYLOPRIM) 300 MG tablet Take 300 mg by mouth daily.    [provider]  amoxicillin (AMOXIL) 500 MG capsule Take 2,000 mg by mouth See admin instructions. Take 1 hour prior to dental work 10/20/22   [provider]  Ascorbic Acid (VITAMIN C) 1000 MG tablet Take 1,000 mg by mouth daily.    [provider]  ascorbic acid (VITAMIN C) 500 MG tablet Take 500 mg by mouth daily.    [provider]  ASPIRIN  81 PO Take 81 mg by mouth  every evening.    [provider]  B Complex-C (SUPER B COMPLEX PO) Take 1 tablet by mouth daily.    [provider]  Calcium Carbonate-Vit D-Min (GNP CALCIUM PLUS 600 +D) 600-200 MG-UNIT TABS Take 1 tablet by mouth daily.    [provider]  carvedilol  (COREG ) 3.125 MG tablet Take 3.125 mg by mouth 2 (two) times daily with a meal.    [provider]  Cholecalciferol (VITAMIN D ) 50 MCG (2000 UT) tablet Take 2,000-4,000 Units by mouth See admin instructions. Take 4000 units in the morning and 2000 units in the evening    [provider]  Cranberry-Vitamin C (CRANBERRY CONCENTRATE/VITAMINC PO) Take 1 capsule by mouth 2 (two) times daily.    [provider]  cycloSPORINE  modified (NEORAL ) 25 MG capsule Take 50 mg by mouth 2 (two) times daily.    [provider]  DULoxetine  (CYMBALTA ) 30 MG capsule TAKE 1 CAPSULE(30 MG) BY MOUTH AT BEDTIME 01/09/23   Wess Hammed, NP  ezetimibe (ZETIA) 10 MG tablet Take 10 mg by mouth every evening.    [provider]  fluorouracil (EFUDEX) 5 % cream Apply 1 Application topically daily as needed (precancerous spots).    [provider]  folic acid  (FOLVITE ) 1 MG tablet Take 1 mg by mouth daily. 09/23/17   [provider]  Menatetrenone (VITAMIN K2) 100 MCG TABS Take  100 mcg by mouth daily.    [provider]  Multiple Vitamin (MULTIVITAMIN WITH MINERALS) TABS tablet Take 1 tablet by mouth daily.    [provider]  mycophenolate  (CELLCEPT ) 500 MG tablet Take 500 mg by mouth 2 (two) times daily.    [provider]  niacin  (VITAMIN B3) 250 MG tablet Take 250 mg by mouth every evening. Take with 500 mg to equal 750 mg daily    [provider]  niacin  (VITAMIN B3) 500 MG tablet Take 500 mg by mouth every evening. Take with 250 mg to equal 750 mg daily    [provider]  Niacinamide-Zn-Cu-Methfo-Se-Cr (NICOTINAMIDE PO) Take 1 tablet by mouth  2 (two) times daily.    [provider]  Omega-3 Fatty Acids (FISH OIL) 1000 MG CAPS Take 1,000 mg by mouth 3 (three) times daily.    [provider]  ondansetron  (ZOFRAN -ODT) 8 MG disintegrating tablet Take 8 mg by mouth every 8 (eight) hours as needed for vomiting or nausea.    [provider]  solifenacin (VESICARE) 10 MG tablet Take 10 mg by mouth every evening.    [provider]  tretinoin  (RETIN-A ) 0.05 % cream Apply 1 Application topically at bedtime.    [provider]  UNABLE TO FIND Take 1 capsule by mouth daily. metagenics-ultraflora IB 60BN CFu    [provider]  valACYclovir  (VALTREX ) 500 MG tablet Take 500 mg by mouth daily with lunch.    [provider]  Vibegron (GEMTESA) 75 MG TABS Take 75 mg by mouth every evening.    [provider]      Allergies    Oxybutynin, Sulfa antibiotics, Bacitracin, Colchicine, Lisinopril, Neomycin, and Sulfabenzamide    Review of Systems   Review of Systems  Physical Exam Updated Vital Signs BP (!) 156/83   Pulse 91   Temp 98.5 F (36.9 C) (Oral)   Resp 18   Ht 5\' 5"  (1.651 m)   Wt 52.2 kg   SpO2 100%   BMI 19.14 kg/m  Physical Exam Vitals and nursing note reviewed.  Constitutional:      General: She is not in acute distress.    Appearance: Normal appearance.  HENT:     Head: Normocephalic and atraumatic.     Nose: Nose normal.     Mouth/Throat:     Mouth: Mucous membranes are moist.  Eyes:     Extraocular Movements: Extraocular movements intact.     Conjunctiva/sclera: Conjunctivae normal.  Cardiovascular:     Rate and Rhythm: Normal rate and regular rhythm.     Heart sounds: Normal heart sounds.  Pulmonary:     Effort: Pulmonary effort is normal.     Breath sounds: Normal breath sounds.  Abdominal:     General: Abdomen is flat.     Palpations: Abdomen is soft.     Tenderness: There is no abdominal tenderness.  Musculoskeletal:        General:  Normal range of motion.     Cervical back: Normal range of motion.     Right lower leg: Edema (2+ in feet) present.     Left lower leg: Edema (2+ in feet) present.  Skin:    General: Skin is warm and dry.  Neurological:     General: No focal deficit present.     Mental Status: She is alert and oriented to person, place, and time.  Psychiatric:        Mood and Affect:  Mood normal.        Behavior: Behavior normal.     ED Results / Procedures / Treatments   Labs (all labs ordered are listed, but only abnormal results are displayed) Labs Reviewed  CBC WITH DIFFERENTIAL/PLATELET - Abnormal; Notable for the following components:      Result Value   WBC 11.0 (*)    RBC 3.66 (*)    MCV 109.0 (*)    MCH 35.0 (*)    Neutro Abs 8.5 (*)    All other components within normal limits  COMPREHENSIVE METABOLIC PANEL WITH GFR - Abnormal; Notable for the following components:   CO2 17 (*)    Glucose, Bld 102 (*)    BUN 39 (*)    Creatinine, Ser 2.14 (*)    Calcium 8.3 (*)    GFR, Estimated 23 (*)    All other components within normal limits  URINALYSIS, ROUTINE W REFLEX MICROSCOPIC - Abnormal; Notable for the following components:   Color, Urine AMBER (*)    APPearance TURBID (*)    Hgb urine dipstick MODERATE (*)    Protein, ur 100 (*)    Leukocytes,Ua MODERATE (*)    Bacteria, UA RARE (*)    Non Squamous Epithelial 0-5 (*)    All other components within normal limits  URINE CULTURE    EKG None  Radiology No results found.  Procedures Procedures    Medications Ordered in ED Medications  lactated ringers  bolus 1,000 mL (1,000 mLs Intravenous New Bag/Given 02/13/24 1915)  cefTRIAXone  (ROCEPHIN ) 1 g in sodium chloride  0.9 % 100 mL IVPB (0 g Intravenous Stopped 02/13/24 1913)    ED Course/ Medical Decision Making/ A&P Clinical Course as of 02/13/24 1952  Wed Feb 13, 2024  1453 Mickiel AlbanyAaron Aas): MODERATE [JS]  1453 WBC, UA: >50 [JS]  1453 WBC Clumps: PRESENT [JS]  1453  WBC(!): 11.0 [JS]  1807 SW evaluated the patient, does not qualify for [facility] but did give information on resources. [VK]    Clinical Course User Index [JS] Soto, Johana, PA-C [VK] Kingsley, Jonel Sick K, DO                                 Medical Decision Making This patient presents to the ED with chief complaint(s) of dysuria with pertinent past medical history of cardiac transplant on immunosuppression, SSS with pacemaker in place, overactive bladder with recurrent UTIs which further complicates the presenting complaint. The complaint involves an extensive differential diagnosis and also carries with it a high risk of complications and morbidity.    The differential diagnosis includes UTI, no signs of sepsis or Pilo on exam, nephrolithiasis unlikely as no associated abdominal or back pain, dehydration, electrolyte abnormality  Additional history obtained: Additional history obtained from family Records reviewed Care Everywhere/External Records  ED Course and Reassessment: On patient's arrival she is hemodynamically stable no acute distress.  Was initially evaluated in triage and had labs and urine performed.  Labs showed mild leukocytosis, urine positive for leuks and bacteria with her symptoms, will be treated for UTI.  Does have an AKI on CKD, likely related to a recent diarrheal illness.  The patient will be given fluids and antibiotics and likely will be stable for discharge after completion of her treatments.  She is requesting to speak with social work about potential home health.  Independent labs interpretation:  The following labs were independently interpreted: AKI on CKD, UA  positive for UTI  Independent visualization of imaging: - N/A  Consultation: - Consulted or discussed management/test interpretation w/ external professional: N/A  Consideration for admission or further workup: Patient has no emergent conditions requiring admission or further work-up at this time and is  stable for discharge home with primary care follow-up  Social Determinants of health: N/A    Amount and/or Complexity of Data Reviewed Labs: ordered.  Risk Prescription drug management.          Final Clinical Impression(s) / ED Diagnoses Final diagnoses:  Lower urinary tract infectious disease  AKI (acute kidney injury) (HCC)    Rx / DC Orders ED Discharge Orders          Ordered    cephALEXin (KEFLEX) 500 MG capsule  4 times daily        02/13/24 1737              Kingsley, Porter Moes K, DO 02/13/24 1952

## 2024-02-13 NOTE — ED Triage Notes (Signed)
 Pt here for UTI, hx of same. Pt states it burns to urinate. Denies fevers.

## 2024-02-13 NOTE — ED Notes (Signed)
 Difficulty with IV start attempt. EDP notified of delay. Second RN asked to attempt.

## 2024-02-13 NOTE — Discharge Instructions (Addendum)
 You were seen in the emergency department for your pain with urination.  You do have a urinary tract infection and you also appeared dehydrated and your kidney function was elevated today.  We gave you IV fluids and I would recommend that you have your kidney function rechecked by your primary doctor within the next week in addition to have your symptoms rechecked.  We did send a urine culture so if you are urine grows back bacteria different from what we have sent you home with, you will receive a call and have a new antibiotic sent to your pharmacy, otherwise he should complete the antibiotics we have prescribed.  You should return to the emergency department if you have fevers despite the antibiotics, vomiting despite the antibiotics with severe abdominal or back pain or any other new or concerning symptoms.

## 2024-02-15 LAB — URINE CULTURE: Culture: >=100000 — AB

## 2024-02-16 ENCOUNTER — Telehealth (HOSPITAL_BASED_OUTPATIENT_CLINIC_OR_DEPARTMENT_OTHER): Payer: Self-pay | Admitting: *Deleted

## 2024-02-16 NOTE — Telephone Encounter (Signed)
 Post ED Visit - Positive Culture Follow-up  Culture report reviewed by antimicrobial stewardship pharmacist: [facility] []  Court Distance, Pharm.D. []  Skeet [facility], Pharm.D., BCPS AQ-ID []  Leslee Rase, Pharm.D., BCPS []  Garland Junk, Pharm.D., BCPS []  [place], [place].D., BCPS, AAHIVP []  Alcide Aly, Pharm.D., BCPS, AAHIVP []  Jerri Morale, PharmD, BCPS []  Graham Laws, PharmD, BCPS []  Cleda Curly, PharmD, BCPS []  Tamar Fairly, PharmD []  Ballard Levels, PharmD, BCPS [x]  Dionicio Fray, PharmD  [facility] []  Arlyne Bering, PharmD []  Sherryle Don, PharmD []  Van Gelinas, PharmD []  Delila Felty, Rph []  Luna Salinas) Cleora Daft, PharmD []  Augustina Block, PharmD []  Arie Kurtz, PharmD []  Sharlyn Deaner, PharmD []  Agnes Hose, PharmD []  Kendall Pauls, PharmD []  Gladstone Lamer, PharmD []  Armanda Bern, PharmD []  Tera Fellows, PharmD   Positive urine culture Treated with Cephalexin, organism sensitive to the same and no further patient follow-up is required at this time.  Georgine Kitchens 02/16/2024, 11:56 AM

## 2024-02-22 DIAGNOSIS — R748 Abnormal levels of other serum enzymes: Secondary | ICD-10-CM | POA: Diagnosis not present

## 2024-03-04 DIAGNOSIS — C44612 Basal cell carcinoma of skin of right upper limb, including shoulder: Secondary | ICD-10-CM | POA: Diagnosis not present

## 2024-03-09 DIAGNOSIS — M6281 Muscle weakness (generalized): Secondary | ICD-10-CM | POA: Diagnosis not present

## 2024-03-09 DIAGNOSIS — I495 Sick sinus syndrome: Secondary | ICD-10-CM | POA: Diagnosis not present

## 2024-03-09 DIAGNOSIS — Z79899 Other long term (current) drug therapy: Secondary | ICD-10-CM | POA: Diagnosis not present

## 2024-03-09 DIAGNOSIS — Z79891 Long term (current) use of opiate analgesic: Secondary | ICD-10-CM | POA: Diagnosis not present

## 2024-03-09 DIAGNOSIS — E7800 Pure hypercholesterolemia, unspecified: Secondary | ICD-10-CM | POA: Diagnosis not present

## 2024-03-09 DIAGNOSIS — R42 Dizziness and giddiness: Secondary | ICD-10-CM | POA: Diagnosis not present

## 2024-03-09 DIAGNOSIS — Z45018 Encounter for adjustment and management of other part of cardiac pacemaker: Secondary | ICD-10-CM | POA: Diagnosis not present

## 2024-03-12 ENCOUNTER — Ambulatory Visit (INDEPENDENT_AMBULATORY_CARE_PROVIDER_SITE_OTHER): Payer: Medicare Other | Admitting: Neurology

## 2024-03-12 ENCOUNTER — Telehealth: Payer: Self-pay | Admitting: Neurology

## 2024-03-12 ENCOUNTER — Encounter: Payer: Self-pay | Admitting: Neurology

## 2024-03-12 VITALS — BP 114/70 | HR 54 | Resp 14

## 2024-03-12 DIAGNOSIS — G6289 Other specified polyneuropathies: Secondary | ICD-10-CM | POA: Diagnosis not present

## 2024-03-12 DIAGNOSIS — R42 Dizziness and giddiness: Secondary | ICD-10-CM

## 2024-03-12 DIAGNOSIS — Z941 Heart transplant status: Secondary | ICD-10-CM

## 2024-03-12 MED ORDER — DULOXETINE HCL 30 MG PO CPEP: ORAL_CAPSULE | ORAL | 3 refills | Status: AC

## 2024-03-12 NOTE — Telephone Encounter (Signed)
 No auth required sent to GI [phone]. It is ok for her to walk-in without an appointment for this.

## 2024-03-12 NOTE — Addendum Note (Signed)
 Addended by: Wess Hammed on: 03/12/2024 11:30 AM   Modules accepted: Level of Service

## 2024-03-12 NOTE — Patient Instructions (Signed)
 Restart Cymbalta  30 mg daily.  Should help with the neuropathy as well as the mood.  Order CT head to be done at [facility] imaging.

## 2024-03-12 NOTE — Progress Notes (Signed)
 Patient: Cassie Harvey Date of Birth: September 24, 1946  Reason for Visit: Follow up History from: Patient Primary Neurologist: Willis/Yan   ASSESSMENT AND PLAN 78 y.o. year old female   1.  History of polio 2.  Residual left leg weakness 3.  Gait abnormality 4.  Chronic low back pain 5.  Recurrent falls 6.  Current pacemaker 7.  History of heart transplant  -Check CT head, has reported dizziness, mostly with standing, movement when walking rule out acute abnormality.  Unable to have MRI of the brain due to pacemaker. - Restart Cymbalta  30 mg daily, help with neuropathy, also mood, husband passed away suddenly a few weeks ago -Drink plenty of water, unable to obtain orthostatics today, wearing slippers, unsteady with standing - Follow-up with [facility] this week regarding edema - Wishes to maintain 20-month follow-up at our office  I personally spent a total of 40 minutes in the care of the patient today including preparing to see the patient, getting/reviewing separately obtained history, performing a medically appropriate exam/evaluation, counseling and educating, placing orders, documenting clinical information in the EHR, communicating results, and coordinating care.   HISTORY OF PRESENT ILLNESS: Cassie Harvey is a 78 year old female with patient of Dr. Tilda Fogo, history of polio with residual left leg weakness, chronic low back pain,   I reviewed and summarized the referring note. PMHX. HTN Polio, affecting left leg, left leg weakness. CAD   Heart Transplant in 2004,  cellcept  500mg  bid, neoral  25mg  bid. S/p pacemaker S/p Left leg DVT   She suffered polio as a child, with residual mild left leg weakness, but highly functioning doing very well on her life  She suffered severe coronary artery disease, heart failure, had heart transplant at [facility] in 2004, continuing to do very well, had pacemaker for irregular heart rate, left lower extremity DVT peri-heart transplant  period of time, since then, she had increased left lower extremity weakness, also had left knee replacement, should not have increased the right knee pain, which has pose more gait difficulty   She has mild bilateral lower extremity feet paresthesia, corrugate diagnosis of peripheral neuropathy, EMG nerve conduction study in 2015 showed no significant abnormalities  She also has longstanding history of chronic low back pain,  I personally reviewed CT myelogram in March 2016, multilevel degenerative changes, facet arthropathy throughout the lumbar region, most pronounced on the right at L1-2, and L4-5, on the left at L5-S1, but no significant central canal stenosis, no significant foraminal stenosis CT cervical spine showed mild curvature convex to the left, mild spondylosis, no significant canal and foraminal narrowing  CT head was unremarkable   She now complains of lightheadedness standing up, dizziness sensation, orthostatic blood pressure measurement showed no significant abnormality  Update July 04, 2022 SS: No pain in legs during the day, happens at night, burning in legs. Still on Cymbalta  30 mg daily, moved it up to noon. Does feel sleepy in the morning. Has had a few falls, uses cane, lose balance, potential BP? Worried with 2 falls bumped her head, would like CT scan .Has had ESI with Dr. Ellery Guthrie for low back pain, last was few years ago, is going to call. Doesn't have PCP. Gets Prolia . Has pacemaker. Mentions memory, can be forgetful, mother had AD in her 76's. 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 Looks good today.   Update January 09, 2023 SS: at night her legs tingle, takes Cymbalta  30 mg at bedtime. Will take  tylenol  at night to help relax her. Needs left knee replacement. Having some balance problems. Uses cane. CT head was stable in November. Needs new battery for pacemaker.   Update 03/12/24 SS: Her husband passed recently suddenly from sepsis.  Adjusting to living alone, he did so much for her, has an aide helping her. Would like an CT scan, reports dizzy spells, memory problems for 1 year. Dizzy when up moving around, will fall into the wall. Not in bed. Swelling to her legs and feet, can only wear slippers. Seeing [facility], started on lasix, having echo. Worsening neuropathy. New walker, few falls, can't bend over. Ran out of Cymbalta . We couldn't get orthostatics, because slippers.  REVIEW OF SYSTEMS: Out of a complete 14 system review of symptoms, the patient complains only of the following symptoms, and all other reviewed systems are negative.  See HPI  ALLERGIES: Allergies  Allergen Reactions   Oxybutynin Shortness Of Breath and Anaphylaxis   Sulfa Antibiotics Rash and Anaphylaxis   Bacitracin Swelling   Colchicine Diarrhea   Lisinopril Cough   Neomycin Swelling    Other Reaction(s): allergy testing   Sulfabenzamide Rash    HOME MEDICATIONS: Outpatient Medications Prior to Visit  Medication Sig Dispense Refill   acetaminophen  (TYLENOL ) 500 MG tablet Take 500-1,000 mg by mouth every 6 (six) hours as needed for moderate pain.     allopurinol (ZYLOPRIM) 300 MG tablet Take 300 mg by mouth daily.     amoxicillin (AMOXIL) 500 MG capsule Take 2,000 mg by mouth See admin instructions. Take 1 hour prior to dental work     Ascorbic Acid (VITAMIN C) 1000 MG tablet Take 1,000 mg by mouth daily.     ascorbic acid (VITAMIN C) 500 MG tablet Take 500 mg by mouth daily.     ASPIRIN  81 PO Take 81 mg by mouth every evening.     B Complex-C (SUPER B COMPLEX PO) Take 1 tablet by mouth daily.     Calcium Carbonate-Vit D-Min (GNP CALCIUM PLUS 600 +D) 600-200 MG-UNIT TABS Take 1 tablet by mouth daily.     carvedilol  (COREG ) 3.125 MG tablet Take 3.125 mg by mouth 2 (two) times daily with a meal.     cephALEXin  (KEFLEX ) 500 MG capsule Take 1 capsule (500 mg total) by mouth 4 (four) times daily. 20 capsule 0   Cholecalciferol (VITAMIN D )  50 MCG (2000 UT) tablet Take 2,000-4,000 Units by mouth See admin instructions. Take 4000 units in the morning and 2000 units in the evening     Cranberry-Vitamin C (CRANBERRY CONCENTRATE/VITAMINC PO) Take 1 capsule by mouth 2 (two) times daily.     cycloSPORINE  modified (NEORAL ) 25 MG capsule Take 50 mg by mouth 2 (two) times daily.     DULoxetine  (CYMBALTA ) 30 MG capsule TAKE 1 CAPSULE(30 MG) BY MOUTH AT BEDTIME 90 capsule 3   ezetimibe (ZETIA) 10 MG tablet Take 10 mg by mouth every evening.     fluorouracil (EFUDEX) 5 % cream Apply 1 Application topically daily as needed (precancerous spots).     folic acid  (FOLVITE ) 1 MG tablet Take 1 mg by mouth daily.  10   furosemide (LASIX) 20 MG tablet Take 20 mg by mouth.     Menatetrenone (VITAMIN K2) 100 MCG TABS Take 100 mcg by mouth daily.     Multiple Vitamin (MULTIVITAMIN WITH MINERALS) TABS tablet Take 1 tablet by mouth daily.     mycophenolate  (CELLCEPT ) 500 MG tablet Take 500 mg by mouth  2 (two) times daily.     niacin  (VITAMIN B3) 250 MG tablet Take 250 mg by mouth every evening. Take with 500 mg to equal 750 mg daily     niacin  (VITAMIN B3) 500 MG tablet Take 500 mg by mouth every evening. Take with 250 mg to equal 750 mg daily     Niacinamide-Zn-Cu-Methfo-Se-Cr (NICOTINAMIDE PO) Take 1 tablet by mouth 2 (two) times daily.     Omega-3 Fatty Acids (FISH OIL) 1000 MG CAPS Take 1,000 mg by mouth 3 (three) times daily.     ondansetron  (ZOFRAN -ODT) 8 MG disintegrating tablet Take 8 mg by mouth every 8 (eight) hours as needed for vomiting or nausea.     solifenacin (VESICARE) 10 MG tablet Take 10 mg by mouth every evening.     tretinoin  (RETIN-A ) 0.05 % cream Apply 1 Application topically at bedtime.     UNABLE TO FIND Take 1 capsule by mouth daily. metagenics-ultraflora IB 60BN CFu     valACYclovir  (VALTREX ) 500 MG tablet Take 500 mg by mouth daily with lunch.     Vibegron (GEMTESA) 75 MG TABS Take 75 mg by mouth every evening.     No  facility-administered medications prior to visit.    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   Hypertension    IBS (irritable bowel syndrome)    Immunosuppression (HCC)    Molar pregnancy    Tx; with chemo   Mouth ulcers    Neuropathy, peripheral    Pacemaker 01/13/2003   Pneumonia    PONV (postoperative nausea and vomiting)    Urinary frequency    Wrist fracture, bilateral    Surgical repair left    PAST SURGICAL HISTORY: Past Surgical History:  Procedure Laterality Date   APPENDECTOMY     BIOPSY  12/08/2022   Procedure: BIOPSY;  Surgeon: Alvis Jourdain, MD;  Location: [facility];  Service: Gastroenterology;;   CARDIAC CATHETERIZATION  2003   2015   COLONOSCOPY     COLONOSCOPY N/A 01/12/2014   Procedure: COLONOSCOPY;  Surgeon: Tami Falcon, MD;  Location: [facility];  Service: Endoscopy;  Laterality: N/A;   COLONOSCOPY WITH PROPOFOL  N/A 12/08/2022   Procedure: COLONOSCOPY WITH PROPOFOL ;  Surgeon: Alvis Jourdain, MD;  Location: [facility];  Service: Gastroenterology;  Laterality: N/A;   DILATION AND CURETTAGE OF UTERUS  1988   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   EAR EXAMINATION UNDER ANESTHESIA  1977   lt middle ear    ELBOW ARTHROPLASTY     rt   HEART TRANSPLANT  11/2002   INJECTION KNEE Left 01/16/2013   Procedure: KNEE INJECTION;  Surgeon: Ferd Householder, MD;  Location: [facility];  Service: Orthopedics;  Laterality: Left;  STEROID    KNEE ARTHROSCOPY  1998   rt   KNEE ARTHROSCOPY WITH MEDIAL MENISECTOMY Right 01/16/2013   Procedure: RIGHT KNEE ARTHROSCOPY WITH MEDIAL AND LATERAL MENISECTOMY, REMOVAL LOOSE FOREIGN BODY AND STEROID INJECTION;  Surgeon: Ferd Householder, MD;  Location: [facility];  Service: Orthopedics;  Laterality: Right;  DEPRIEDMENT LATERAL MENISCUS AND CHONDROPLASTY   PACEMAKER INSERTION  1610,9604   new dual chamber -medtronic  5076-art/vent leads   SEPTOPLASTY  1992   SHOULDER ARTHROSCOPY  96,02   right abd left   STERIOD INJECTION Left 01/16/2013   Procedure: STEROID INJECTION;  Surgeon: Ferd Householder, MD;  Location: [facility];  Service: Orthopedics;  Laterality: Left;   TOTAL HIP ARTHROPLASTY  02/2011   rt   TOTAL KNEE ARTHROPLASTY Right 02/25/2014   Procedure: RIGHT TOTAL KNEE ARTHROPLASTY;  Surgeon: Ferd Householder, MD;  [facility];  Service: Orthopedics;  Laterality: Right;   TUBAL LIGATION  1990   WRIST ARTHROPLASTY  2008   left    FAMILY HISTORY: Family History  Problem Relation Age of Onset   Pulmonary fibrosis Father     SOCIAL HISTORY: Social History   Socioeconomic History   Marital status: Married    Spouse name: Not on file   Number of children: 0   Years of education: college   Highest education level: Not on file  Occupational History   Occupation: Retired  Tobacco Use   Smoking status: Never   Smokeless tobacco: Never  Substance and Sexual Activity   Alcohol use: No   Drug use: No   Sexual activity: Yes  Other Topics Concern   Not on file  Social History Narrative   Not on file   Social Drivers of Health   Financial Resource Strain: Not on file  Food Insecurity: Low Risk  (01/22/2024)   Received from [facility]   Hunger Vital Sign    Within the past 12 months, you worried that your food would run out before you got money to buy more: Never true    Within the past 12 months, the food you bought just didn't last and you didn't have money to get more. : Never true  Transportation Needs: No Transportation Needs (01/22/2024)   Received from [facility]    In the past 12 months, has lack of reliable transportation kept you from  medical appointments, meetings, work or from getting things needed for daily living? : No  Physical Activity: Not on file  Stress: Not on file  Social Connections: Not on file  Intimate Partner Violence: Not on file   PHYSICAL EXAM  Vitals:   03/12/24 1044  BP: 114/70  Pulse: (!) 54  Resp: 14  SpO2: 93%   There is no height or weight on file to calculate BMI.  Generalized: Well developed, in no acute distress. Very well dressed and groomed. Neurological examination  Mentation: Alert oriented to time, place, history taking. Follows all commands speech and language fluent Cranial nerve II-XII: Pupils were equal round reactive to light. Extraocular movements were full, visual field were full on confrontational test. Facial sensation and strength were normal. Head turning and shoulder shrug  were normal and symmetric. Motor: 3/5 left hip flexion weakness, 4/5 left knee extension and flexion Sensory: Decreased soft touch sensation to lower legs to mid shin. 2-3+ pitting edema lower extremities Coordination: Cerebellar testing reveals good finger-nose-finger bilaterally, hard to do heel-to-shin with leg swelling Gait and station: Not ambulated today.  Seated in wheelchair Reflexes: Deep tendon reflexes are symmetric but decreased  DIAGNOSTIC DATA (LABS, IMAGING, TESTING) - I reviewed patient records, labs, notes, testing and imaging myself where available.  Lab Results  Component Value Date   WBC 11.0 (H) 02/13/2024   HGB 12.8 02/13/2024   HCT 39.9 02/13/2024   MCV 109.0 (H) 02/13/2024   PLT 378 02/13/2024      Component  Value Date/Time   NA 139 02/13/2024 1400   NA 141 10/09/2023 1537   K 4.6 02/13/2024 1400   CL 107 02/13/2024 1400   CO2 17 (L) 02/13/2024 1400   GLUCOSE 102 (H) 02/13/2024 1400   BUN 39 (H) 02/13/2024 1400   BUN 28 (H) 10/09/2023 1537   CREATININE 2.14 (H) 02/13/2024 1400   CALCIUM 8.3 (L) 02/13/2024 1400   PROT 6.7 02/13/2024 1400   PROT 6.6 10/12/2020  1346   ALBUMIN 3.6 02/13/2024 1400   ALBUMIN 3.9 10/12/2020 1346   AST 31 02/13/2024 1400   ALT 24 02/13/2024 1400   ALKPHOS 50 02/13/2024 1400   BILITOT 0.6 02/13/2024 1400   BILITOT 0.3 10/12/2020 1346   GFRNONAA 23 (L) 02/13/2024 1400   GFRAA 35 (L) 10/12/2020 1346   No results found for: CHOL, HDL, LDLCALC, LDLDIRECT, TRIG, CHOLHDL Lab Results  Component Value Date   HGBA1C 5.2 12/22/2021   Lab Results  Component Value Date   VITAMINB12 1,301 (H) 12/22/2021   Lab Results  Component Value Date   TSH 2.530 10/16/2013    Jeanmarie Millet, AGNP-C, DNP 03/12/2024, 10:48 AM [facility] [address] [place], [place] [address] [phone]

## 2024-03-13 ENCOUNTER — Ambulatory Visit: Payer: Self-pay | Admitting: Neurology

## 2024-03-13 ENCOUNTER — Ambulatory Visit
Admission: RE | Admit: 2024-03-13 | Discharge: 2024-03-13 | Disposition: A | Discharge status: Home / Self Care | Source: Ambulatory Visit | Attending: Neurology | Admitting: Neurology

## 2024-03-13 DIAGNOSIS — R296 Repeated falls: Secondary | ICD-10-CM | POA: Diagnosis not present

## 2024-03-13 DIAGNOSIS — R42 Dizziness and giddiness: Secondary | ICD-10-CM

## 2024-03-13 DIAGNOSIS — R55 Syncope and collapse: Secondary | ICD-10-CM | POA: Diagnosis not present

## 2024-03-15 ENCOUNTER — Encounter (HOSPITAL_COMMUNITY): Payer: Self-pay

## 2024-03-15 ENCOUNTER — Ambulatory Visit (INDEPENDENT_AMBULATORY_CARE_PROVIDER_SITE_OTHER)

## 2024-03-15 ENCOUNTER — Ambulatory Visit (HOSPITAL_COMMUNITY)
Admission: RE | Admit: 2024-03-15 | Discharge: 2024-03-15 | Disposition: A | Discharge status: Home / Self Care | Source: Ambulatory Visit

## 2024-03-15 VITALS — BP 126/69 | HR 90 | Temp 98.1°F | Resp 16

## 2024-03-15 DIAGNOSIS — R0602 Shortness of breath: Secondary | ICD-10-CM | POA: Diagnosis not present

## 2024-03-15 DIAGNOSIS — R600 Localized edema: Secondary | ICD-10-CM | POA: Diagnosis not present

## 2024-03-15 DIAGNOSIS — I700 Atherosclerosis of aorta: Secondary | ICD-10-CM | POA: Diagnosis not present

## 2024-03-15 DIAGNOSIS — Z950 Presence of cardiac pacemaker: Secondary | ICD-10-CM | POA: Diagnosis not present

## 2024-03-15 NOTE — Discharge Instructions (Signed)
 We were not able to draw blood today to check your kidney function.   Continue taking the lasix. It looks like you might have a small amount of fluid in the bottom of the right lung (a pleural effusion). See your transplant team Tuesday as scheduled.   If your shortness of breath becomes severe between now and Tuesday, do not wait until Tuesday to get checked in person.

## 2024-03-15 NOTE — ED Triage Notes (Signed)
 Patient here today with c/o swelling in her feet and ankles X 5 weeks. Patient takes Lasix and they have come down but still swollen. Patient was in the hospital last month for UTI and was given fluids. Patient would like to check and see if she still has a UTI. Patient completed Cephalexin .

## 2024-03-15 NOTE — ED Notes (Signed)
 PATIENT HAS BEEN GIVEN MORE WATER  Patient does not want to be stuck again for blood work

## 2024-03-15 NOTE — ED Provider Notes (Signed)
 [facility]    CSN: [id number] Arrival date & time: 03/15/24  1300      History   Chief Complaint Chief Complaint  Patient presents with   Leg Swelling    HPI Cassie Harvey is a 78 y.o. female.   BLE edema for the last 5 weeks. When it first started, Cr was 2.1. Last creatinine check ~2-3 weeks ago was 1.6. Has had similar peripheral edema in the past and thinks it was from kidney problem. Wants renal function checked. Hx heart transplant. Transplant team at [facility] is aware of her peripheral LE edema and prescribed lasix a week ago. Pt feels lasix is just now kicking in and helping to reduce edema. RLE edema worse than LLE. Has been increasingly SOB when walking recently. Is scheduled for echo  on 03/18/24 because of this. Also is skipping a beat  on occasion with her pacemaker and this is not a new problem, is scheduled to see EP cardiologist next month. She does not feel it when it happens. Denies irregular heartbeat. Pacemaker check in care everywhere notes she is having brief SVT events <10 seconds and that pacemaker battery is nearing end of life.   Also has recurrent UTIs and wants checked for UTI today. Does not have UTI sx today.   HPI  Past Medical History:  Diagnosis Date   Broken shoulder    right   Chronic pain    DDD (degenerative disc disease), lumbar    DJD (degenerative joint disease)    Family history of anesthesia complication    My father suffered from a dysrythmia   Heart transplanted (HCC) 2004   History of poliomyelitis 10/15/2013   Hypertension    IBS (irritable bowel syndrome)    Immunosuppression (HCC)    Molar pregnancy    Tx; with chemo   Mouth ulcers    Neuropathy, peripheral    Pacemaker 01/13/2003   Pneumonia    PONV (postoperative nausea and vomiting)    Urinary frequency    Wrist fracture, bilateral    Surgical repair left    Patient Active Problem List   Diagnosis Date Noted   Bilateral sensorineural hearing loss  12/22/2021   Candidiasis of mouth 12/22/2021   Dry mouth 12/22/2021   Joint pain 12/22/2021   Other long term (current) drug therapy 12/22/2021   Family history of polio 12/22/2021   Neuropathy 12/22/2021   Primary gout 10/27/2021   Vaginal atrophy 10/27/2021   Dysesthesia 09/06/2020   Recurrent UTI 11/20/2018   Abnormality of gait due to impairment of balance 12/18/2017   Dizziness 12/18/2017   Mixed hearing loss of right ear 03/10/2016   Fitting or adjustment of cardiac pacemaker 09/13/2015   Fecal incontinence 08/24/2015   Pelvic floor dysfunction 08/24/2015   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   Hypercholesterolemia 02/18/2015   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   DJD (degenerative joint disease) of knee 02/25/2014   High risk medications (not anticoagulants) long-term use 02/23/2014   Lumbar radicular pain 02/23/2014   Muscle weakness 10/15/2013   History of poliomyelitis 10/15/2013   Onychomycosis 02/19/2013   Mouth ulcers 06/10/2012   Peripheral neuropathy 06/10/2012   Status post heart transplantation (HCC) 12/14/2011   Lumbar degenerative disc disease 11/21/2011   History of nonmelanoma skin cancer 11/16/2011   Immunosuppression (HCC) 11/16/2011   Cardiac pacemaker in situ 08/07/2011   SSS (sick sinus syndrome) (HCC) 08/07/2011    Past Surgical History:  Procedure Laterality Date   APPENDECTOMY  BIOPSY  12/08/2022   Procedure: BIOPSY;  Surgeon: Rollin Dover, MD;  Location: [facility];  Service: Gastroenterology;;   CARDIAC CATHETERIZATION  2003   2015   COLONOSCOPY     COLONOSCOPY N/A 01/12/2014   Procedure: COLONOSCOPY;  Surgeon: Renaye Sous, MD;  Location: [facility];  Service: Endoscopy;  Laterality: N/A;   COLONOSCOPY WITH PROPOFOL  N/A 12/08/2022   Procedure: COLONOSCOPY WITH PROPOFOL ;  Surgeon: Rollin Dover, MD;  Location: [facility];  Service: Gastroenterology;  Laterality: N/A;   DILATION AND CURETTAGE OF UTERUS  1988   tumor molar  pregnancy-had chemo   EAR EXAMINATION UNDER ANESTHESIA  1977   lt middle ear    ELBOW ARTHROPLASTY     rt   HEART TRANSPLANT  11/2002   INJECTION KNEE Left 01/16/2013   Procedure: KNEE INJECTION;  Surgeon: Toribio JULIANNA Chancy, MD;  Location: [facility];  Service: Orthopedics;  Laterality: Left;  STEROID    KNEE ARTHROSCOPY  1998   rt   KNEE ARTHROSCOPY WITH MEDIAL MENISECTOMY Right 01/16/2013   Procedure: RIGHT KNEE ARTHROSCOPY WITH MEDIAL AND LATERAL MENISECTOMY, REMOVAL LOOSE FOREIGN BODY AND STEROID INJECTION;  Surgeon: Toribio JULIANNA Chancy, MD;  Location: [facility];  Service: Orthopedics;  Laterality: Right;  DEPRIEDMENT LATERAL MENISCUS AND CHONDROPLASTY   PACEMAKER INSERTION  7995,7986   new dual chamber -medtronic 5076-art/vent leads   SEPTOPLASTY  1992   SHOULDER ARTHROSCOPY  96,02   right abd left   STERIOD INJECTION Left 01/16/2013   Procedure: STEROID INJECTION;  Surgeon: Toribio JULIANNA Chancy, MD;  Location: [facility];  Service: Orthopedics;  Laterality: Left;   TOTAL HIP ARTHROPLASTY  02/2011   rt   TOTAL KNEE ARTHROPLASTY Right 02/25/2014   Procedure: RIGHT TOTAL KNEE ARTHROPLASTY;  Surgeon: Toribio JULIANNA Chancy, MD;  Location: [facility];  Service: Orthopedics;  Laterality: Right;   TUBAL LIGATION  1990   WRIST ARTHROPLASTY  2008   left    OB History   No obstetric history on file.      Home Medications    Prior to Admission medications   Medication Sig Start Date End Date Taking? Authorizing Provider  MYRBETRIQ 25 MG TB24 tablet Take 25 mg by mouth daily. 03/12/24  Yes [provider]  acetaminophen  (TYLENOL ) 500 MG tablet Take 500-1,000 mg by mouth every 6 (six) hours as needed for moderate pain.    [provider]  allopurinol (ZYLOPRIM) 300 MG tablet Take 300 mg by mouth daily.    [provider]  amoxicillin (AMOXIL) 500 MG capsule Take 2,000 mg by mouth See admin instructions. Take 1 hour prior to dental work  10/20/22   [provider]  Ascorbic Acid (VITAMIN C) 1000 MG tablet Take 1,000 mg by mouth daily.    [provider]  ascorbic acid (VITAMIN C) 500 MG tablet Take 500 mg by mouth daily.    [provider]  ASPIRIN  81 PO Take 81 mg by mouth every evening.    [provider]  B Complex-C (SUPER B COMPLEX PO) Take 1 tablet by mouth daily.    [provider]  Calcium Carbonate-Vit D-Min (GNP CALCIUM PLUS 600 +D) 600-200 MG-UNIT TABS Take 1 tablet by mouth daily.    [provider]  carvedilol  (COREG ) 3.125 MG tablet Take 3.125 mg by mouth 2 (two) times daily with a meal.    [provider]  Cholecalciferol (VITAMIN D ) 50 MCG (2000 UT) tablet Take 2,000-4,000 Units by  mouth See admin instructions. Take 4000 units in the morning and 2000 units in the evening    [provider]  Cranberry-Vitamin C (CRANBERRY CONCENTRATE/VITAMINC PO) Take 1 capsule by mouth 2 (two) times daily.    [provider]  cycloSPORINE  modified (NEORAL ) 25 MG capsule Take 50 mg by mouth 2 (two) times daily.    [provider]  DULoxetine  (CYMBALTA ) 30 MG capsule TAKE 1 CAPSULE(30 MG) BY MOUTH AT BEDTIME 03/12/24   Gayland Lauraine PARAS, NP  ezetimibe (ZETIA) 10 MG tablet Take 10 mg by mouth every evening.    [provider]  fluorouracil (EFUDEX) 5 % cream Apply 1 Application topically daily as needed (precancerous spots).    [provider]  folic acid  (FOLVITE ) 1 MG tablet Take 1 mg by mouth daily. 09/23/17   [provider]  furosemide (LASIX) 20 MG tablet Take 20 mg by mouth.    [provider]  Menatetrenone (VITAMIN K2) 100 MCG TABS Take 100 mcg by mouth daily.    [provider]  Multiple Vitamin (MULTIVITAMIN WITH MINERALS) TABS tablet Take 1 tablet by mouth daily.    [provider]  mycophenolate  (CELLCEPT ) 500 MG tablet Take 500 mg by mouth 2 (two) times daily.    [provider]  niacin  (VITAMIN B3) 250 MG tablet Take 250 mg by mouth every evening. Take with 500 mg to equal 750 mg daily    [provider]  niacin  (VITAMIN B3) 500 MG tablet Take 500 mg by mouth every evening. Take with 250 mg to equal 750 mg daily    [provider]  Niacinamide-Zn-Cu-Methfo-Se-Cr (NICOTINAMIDE PO) Take 1 tablet by mouth 2 (two) times daily.    [provider]  Omega-3 Fatty Acids (FISH OIL) 1000 MG CAPS Take 1,000 mg by mouth 3 (three) times daily.    [provider]  ondansetron  (ZOFRAN -ODT) 8 MG disintegrating tablet Take 8 mg by mouth every 8 (eight) hours as needed for vomiting or nausea.    [provider]  solifenacin (VESICARE) 10 MG tablet Take 10 mg by mouth every evening.    [provider]  tretinoin  (RETIN-A ) 0.05 % cream Apply 1 Application topically at bedtime.    [provider]  UNABLE TO FIND Take 1 capsule by mouth daily. metagenics-ultraflora IB 60BN CFu    [provider]  valACYclovir  (VALTREX ) 500 MG tablet Take 500 mg by mouth daily with lunch.    [provider]    Family History Family History  Problem Relation Age of Onset   Pulmonary fibrosis Father     Social History Social History   Tobacco Use   Smoking status: Never   Smokeless tobacco: Never  Substance Use Topics   Alcohol use: No   Drug use: No     Allergies   Oxybutynin, Sulfa antibiotics, Bacitracin, Colchicine, Lisinopril, Neomycin, and Sulfabenzamide   Review of Systems Review of Systems   Physical Exam Triage Vital Signs ED Triage Vitals  Encounter Vitals Group     BP 03/15/24 1311 126/69     Girls Systolic BP Percentile --      Girls Diastolic BP Percentile --      Boys Systolic BP Percentile --      Boys Diastolic BP Percentile --      Pulse Rate 03/15/24 1311 90     Resp 03/15/24 1311 16     Temp 03/15/24 1311 98.1 F (36.7 C)  Temp Source 03/15/24 1311 Oral     SpO2 03/15/24 1311  95 %     Weight --      Height --      Head Circumference --      Peak Flow --      Pain Score 03/15/24 1317 0     Pain Loc --      Pain Education --      Exclude from Growth Chart --    No data found.  Updated Vital Signs BP 126/69 (BP Location: Left Arm)   Pulse 90   Temp 98.1 F (36.7 C) (Oral)   Resp 16   SpO2 95%   Visual Acuity Right Eye Distance:   Left Eye Distance:   Bilateral Distance:    Right Eye Near:   Left Eye Near:    Bilateral Near:     Physical Exam Constitutional:      Appearance: Normal appearance.   Cardiovascular:     Rate and Rhythm: Normal rate. Rhythm irregular.     Pulses:          Dorsalis pedis pulses are 2+ on the right side and 2+ on the left side.     Comments: Intermittently irregular rhythm.   I cannot palpate PT pulse on either ankle.  Pulmonary:     Effort: Pulmonary effort is normal.     Breath sounds: Normal breath sounds.   Musculoskeletal:     Right lower leg: 4+ Edema present.     Left lower leg: 2+ Edema present.     Comments: LLE weakness is not new per patient - hx polio at 78 years old   Skin:    General: Skin is warm and dry.     Capillary Refill: Capillary refill takes less than 2 seconds.   Neurological:     Mental Status: She is alert.      UC Treatments / Results  Labs (all labs ordered are listed, but only abnormal results are displayed) Labs Reviewed - No data to display   EKG   Radiology DG Chest 2 View Result Date: 03/15/2024 CLINICAL DATA:  Shortness of breath with peripheral edema. Heart transplant patient. EXAM: CHEST - 2 VIEW COMPARISON:  07/06/2002 FINDINGS: Status post median sternotomy. Left chest wall pacer noted with leads in the right atrial appendage and right ventricle. Aortic atherosclerosis. Normal heart size. No interstitial edema or airspace consolidation. Blunting of the right costophrenic angle. Thoracic degenerative disc disease. IMPRESSION: 1. Blunting of the right  costophrenic angle may reflect a small effusion. No interstitial edema or airspace consolidation. 2. Aortic Atherosclerosis (ICD10-I70.0). Electronically Signed   By: Waddell Calk M.D.   On: 03/15/2024 14:23    Procedures Procedures (including critical care time)  Medications Ordered in UC Medications - No data to display  Initial Impression / Assessment and Plan / UC Course  I have reviewed the triage vital signs and the nursing notes.  Pertinent labs & imaging results that were available during my care of the patient were reviewed by me and considered in my medical decision making (see chart for details).  Clinical Course as of 03/15/24 1522  Sat Mar 15, 2024  1421 DG Chest 2 View [AK]    Clinical Course User Index [AK] Richad Jon HERO, NP    She is unable to void and tech is unable to draw blood after 2 tries. Pt declines further attempts at blood draw, states she will have her kidney function checked on  03/18/24 when she has her echo, transplant team f/u. I do think she needs renal function checked, ok to wait 3 more days. As she has no current UTI sx and it was pt request to check UA, I will cancel it.   CXR showed possible small R pleural effusion. She has good lung sounds in all fields and while SOB, she does not feel increasingly SOB.   See transplant team in 3 days as scheduled and unless sx become severely worse between now and then, she can wait to get further testing/eval.   Final Clinical Impressions(s) / UC Diagnoses   Final diagnoses:  Shortness of breath  Peripheral edema     Discharge Instructions      We were not able to draw blood today to check your kidney function.   Continue taking the lasix. It looks like you might have a small amount of fluid in the bottom of the right lung (a pleural effusion). See your transplant team Tuesday as scheduled.   If your shortness of breath becomes severe between now and Tuesday, do not wait until Tuesday to get  checked in person.        ED Prescriptions   None    PDMP not reviewed this encounter.   Richad Jon HERO, NP 03/15/24 [phone]

## 2024-03-18 DIAGNOSIS — I071 Rheumatic tricuspid insufficiency: Secondary | ICD-10-CM | POA: Diagnosis not present

## 2024-03-18 DIAGNOSIS — Z4821 Encounter for aftercare following heart transplant: Secondary | ICD-10-CM | POA: Diagnosis not present

## 2024-03-18 DIAGNOSIS — R0602 Shortness of breath: Secondary | ICD-10-CM | POA: Diagnosis not present

## 2024-03-18 DIAGNOSIS — N179 Acute kidney failure, unspecified: Secondary | ICD-10-CM | POA: Diagnosis not present

## 2024-03-18 DIAGNOSIS — I340 Nonrheumatic mitral (valve) insufficiency: Secondary | ICD-10-CM | POA: Diagnosis not present

## 2024-03-18 DIAGNOSIS — Z941 Heart transplant status: Secondary | ICD-10-CM | POA: Diagnosis not present

## 2024-03-18 DIAGNOSIS — C44612 Basal cell carcinoma of skin of right upper limb, including shoulder: Secondary | ICD-10-CM | POA: Diagnosis not present

## 2024-03-18 DIAGNOSIS — Z48298 Encounter for aftercare following other organ transplant: Secondary | ICD-10-CM | POA: Diagnosis not present

## 2024-03-18 DIAGNOSIS — Z5181 Encounter for therapeutic drug level monitoring: Secondary | ICD-10-CM | POA: Diagnosis not present

## 2024-03-18 DIAGNOSIS — Z2989 Encounter for other specified prophylactic measures: Secondary | ICD-10-CM | POA: Diagnosis not present

## 2024-03-18 DIAGNOSIS — Z79899 Other long term (current) drug therapy: Secondary | ICD-10-CM | POA: Diagnosis not present

## 2024-03-25 DIAGNOSIS — Z79891 Long term (current) use of opiate analgesic: Secondary | ICD-10-CM | POA: Diagnosis not present

## 2024-03-25 DIAGNOSIS — E7800 Pure hypercholesterolemia, unspecified: Secondary | ICD-10-CM | POA: Diagnosis not present

## 2024-03-25 DIAGNOSIS — R42 Dizziness and giddiness: Secondary | ICD-10-CM | POA: Diagnosis not present

## 2024-03-25 DIAGNOSIS — I495 Sick sinus syndrome: Secondary | ICD-10-CM | POA: Diagnosis not present

## 2024-03-25 DIAGNOSIS — Z79899 Other long term (current) drug therapy: Secondary | ICD-10-CM | POA: Diagnosis not present

## 2024-03-25 DIAGNOSIS — Z45018 Encounter for adjustment and management of other part of cardiac pacemaker: Secondary | ICD-10-CM | POA: Diagnosis not present

## 2024-03-25 DIAGNOSIS — M6281 Muscle weakness (generalized): Secondary | ICD-10-CM | POA: Diagnosis not present

## 2024-03-25 DIAGNOSIS — Z941 Heart transplant status: Secondary | ICD-10-CM | POA: Diagnosis not present

## 2024-03-25 DIAGNOSIS — Z950 Presence of cardiac pacemaker: Secondary | ICD-10-CM | POA: Diagnosis not present

## 2024-03-25 NOTE — Telephone Encounter (Addendum)
 [employer]  Patient is ready for scheduling on or after: 04/10/24   Out-of-pocket cost due at time of visit: $0  Primary: [place]  Medicare Prolia  co-insurance: 20% (approximately $331.87) Admin fee co-insurance: 20% (approximately $25)  Deductible: $257 of $257 met  Prior Auth: NOT required  Secondary: AARP med supp this plan is a Medicare Supplement Plan F and it covers the Medicare Part B deductible, co-insurance and 100% of the excess charges  ** This summary of benefits is an estimation of the patient's out-of-pocket cost. Exact cost may vary based on individual plan coverage.

## 2024-04-02 DIAGNOSIS — Z5181 Encounter for therapeutic drug level monitoring: Secondary | ICD-10-CM | POA: Diagnosis not present

## 2024-04-02 DIAGNOSIS — Z79899 Other long term (current) drug therapy: Secondary | ICD-10-CM | POA: Diagnosis not present

## 2024-04-02 DIAGNOSIS — D849 Immunodeficiency, unspecified: Secondary | ICD-10-CM | POA: Diagnosis not present

## 2024-04-02 DIAGNOSIS — Z941 Heart transplant status: Secondary | ICD-10-CM | POA: Diagnosis not present

## 2024-04-02 DIAGNOSIS — Z48298 Encounter for aftercare following other organ transplant: Secondary | ICD-10-CM | POA: Diagnosis not present

## 2024-04-02 DIAGNOSIS — Z2989 Encounter for other specified prophylactic measures: Secondary | ICD-10-CM | POA: Diagnosis not present

## 2024-04-08 ENCOUNTER — Other Ambulatory Visit: Payer: Self-pay | Admitting: *Deleted

## 2024-04-08 DIAGNOSIS — M810 Age-related osteoporosis without current pathological fracture: Secondary | ICD-10-CM

## 2024-04-09 DIAGNOSIS — M810 Age-related osteoporosis without current pathological fracture: Secondary | ICD-10-CM | POA: Diagnosis not present

## 2024-04-10 ENCOUNTER — Ambulatory Visit: Payer: Self-pay | Admitting: Family Medicine

## 2024-04-10 ENCOUNTER — Encounter: Payer: Self-pay | Admitting: *Deleted

## 2024-04-10 ENCOUNTER — Ambulatory Visit: Payer: Medicare Other | Admitting: Family Medicine

## 2024-04-10 DIAGNOSIS — M810 Age-related osteoporosis without current pathological fracture: Secondary | ICD-10-CM | POA: Diagnosis not present

## 2024-04-10 LAB — VITAMIN D 25 HYDROXY (VIT D DEFICIENCY, FRACTURES): Vit D, 25-Hydroxy: 39.2 ng/mL (ref 30.0–100.0)

## 2024-04-10 MED ORDER — DENOSUMAB 60 MG/ML ~~LOC~~ SOSY
60.0000 mg | PREFILLED_SYRINGE | Freq: Once | SUBCUTANEOUS | Status: AC
  Administered 2024-04-10: 60 mg via SUBCUTANEOUS

## 2024-04-10 NOTE — Progress Notes (Unsigned)
 Patient given [place] prolia  injection 60mg /ml in the left lower abdomen. Patient tolerated injection well without reaction at the injection site. Patient will schedule next injection, which is 6 months from today.

## 2024-04-11 DIAGNOSIS — Z9089 Acquired absence of other organs: Secondary | ICD-10-CM | POA: Diagnosis not present

## 2024-04-15 DIAGNOSIS — D849 Immunodeficiency, unspecified: Secondary | ICD-10-CM | POA: Diagnosis not present

## 2024-04-15 DIAGNOSIS — Z79899 Other long term (current) drug therapy: Secondary | ICD-10-CM | POA: Diagnosis not present

## 2024-04-15 DIAGNOSIS — Z48298 Encounter for aftercare following other organ transplant: Secondary | ICD-10-CM | POA: Diagnosis not present

## 2024-04-15 DIAGNOSIS — Z941 Heart transplant status: Secondary | ICD-10-CM | POA: Diagnosis not present

## 2024-04-15 DIAGNOSIS — Z5181 Encounter for therapeutic drug level monitoring: Secondary | ICD-10-CM | POA: Diagnosis not present

## 2024-04-21 ENCOUNTER — Telehealth: Payer: Self-pay | Admitting: *Deleted

## 2024-04-21 NOTE — Telephone Encounter (Signed)
 Patient says her vitamin D  dosage is 2000 x 3 a day.  She takes two in the morning with her vit K and one with her afternoon meds.

## 2024-04-22 NOTE — Telephone Encounter (Signed)
 That's a pretty good dosage.  I think she can continue with what she's doing for now and have her recheck it in about 3 months to make sure it's not continuing to trend downwards.  As long as it's above 20 she's fine but want to make sure it doesn't go there.

## 2024-04-23 DIAGNOSIS — I495 Sick sinus syndrome: Secondary | ICD-10-CM | POA: Diagnosis not present

## 2024-04-23 DIAGNOSIS — Z950 Presence of cardiac pacemaker: Secondary | ICD-10-CM | POA: Diagnosis not present

## 2024-04-23 DIAGNOSIS — R9431 Abnormal electrocardiogram [ECG] [EKG]: Secondary | ICD-10-CM | POA: Diagnosis not present

## 2024-04-23 DIAGNOSIS — I493 Ventricular premature depolarization: Secondary | ICD-10-CM | POA: Diagnosis not present

## 2024-04-23 DIAGNOSIS — T82111A Breakdown (mechanical) of cardiac pulse generator (battery), initial encounter: Secondary | ICD-10-CM | POA: Diagnosis not present

## 2024-05-05 DIAGNOSIS — Z48298 Encounter for aftercare following other organ transplant: Secondary | ICD-10-CM | POA: Diagnosis not present

## 2024-05-05 DIAGNOSIS — Z5181 Encounter for therapeutic drug level monitoring: Secondary | ICD-10-CM | POA: Diagnosis not present

## 2024-05-05 DIAGNOSIS — Z941 Heart transplant status: Secondary | ICD-10-CM | POA: Diagnosis not present

## 2024-05-05 DIAGNOSIS — Z2989 Encounter for other specified prophylactic measures: Secondary | ICD-10-CM | POA: Diagnosis not present

## 2024-05-05 DIAGNOSIS — Z79899 Other long term (current) drug therapy: Secondary | ICD-10-CM | POA: Diagnosis not present

## 2024-05-05 DIAGNOSIS — D849 Immunodeficiency, unspecified: Secondary | ICD-10-CM | POA: Diagnosis not present

## 2024-05-06 DIAGNOSIS — D225 Melanocytic nevi of trunk: Secondary | ICD-10-CM | POA: Diagnosis not present

## 2024-05-06 DIAGNOSIS — C44329 Squamous cell carcinoma of skin of other parts of face: Secondary | ICD-10-CM | POA: Diagnosis not present

## 2024-05-06 DIAGNOSIS — D2260 Melanocytic nevi of unspecified upper limb, including shoulder: Secondary | ICD-10-CM | POA: Diagnosis not present

## 2024-05-06 DIAGNOSIS — L814 Other melanin hyperpigmentation: Secondary | ICD-10-CM | POA: Diagnosis not present

## 2024-05-06 DIAGNOSIS — L821 Other seborrheic keratosis: Secondary | ICD-10-CM | POA: Diagnosis not present

## 2024-05-06 DIAGNOSIS — D849 Immunodeficiency, unspecified: Secondary | ICD-10-CM | POA: Diagnosis not present

## 2024-05-06 DIAGNOSIS — D2270 Melanocytic nevi of unspecified lower limb, including hip: Secondary | ICD-10-CM | POA: Diagnosis not present

## 2024-05-06 DIAGNOSIS — Z85828 Personal history of other malignant neoplasm of skin: Secondary | ICD-10-CM | POA: Diagnosis not present

## 2024-05-06 DIAGNOSIS — C44529 Squamous cell carcinoma of skin of other part of trunk: Secondary | ICD-10-CM | POA: Diagnosis not present

## 2024-05-06 DIAGNOSIS — L570 Actinic keratosis: Secondary | ICD-10-CM | POA: Diagnosis not present

## 2024-05-22 ENCOUNTER — Encounter (HOSPITAL_COMMUNITY): Payer: Self-pay

## 2024-05-22 ENCOUNTER — Ambulatory Visit (HOSPITAL_COMMUNITY)
Admission: RE | Admit: 2024-05-22 | Discharge: 2024-05-22 | Discharge status: Against Medical Advice | Payer: Self-pay | Source: Ambulatory Visit

## 2024-05-22 DIAGNOSIS — Z0189 Encounter for other specified special examinations: Secondary | ICD-10-CM

## 2024-05-22 LAB — POCT URINALYSIS DIP (MANUAL ENTRY)
Bilirubin, UA: NEGATIVE
Blood, UA: NEGATIVE
Glucose, UA: NEGATIVE mg/dL
Ketones, POC UA: NEGATIVE mg/dL
Nitrite, UA: NEGATIVE
Protein Ur, POC: 100 mg/dL — AB
Spec Grav, UA: 1.015 (ref 1.010–1.025)
Urobilinogen, UA: 0.2 U/dL
pH, UA: 5 (ref 5.0–8.0)

## 2024-05-22 NOTE — ED Notes (Signed)
Patient LWBS after triage.

## 2024-05-22 NOTE — ED Triage Notes (Signed)
 Patient presenting with diarrhea, burning with urination, malodor to the urine onset 1 month ago. Patient was seen in the ER for a uti recently. Once she finished the antibiotics the symptoms returned.  Prescriptions or OTC medications tried: No    Patient has history of a heart transplant and re-occurring UTIs.

## 2024-05-22 NOTE — ED Provider Notes (Signed)
 Patient presented for evaluation of urinary symptoms and provided a urine sample for urinalysis. Patient became upset regarding wait time and elected to leave the clinic. No evaluation or treatment was rendered.   Iola Lukes, [place] 05/22/24 2038

## 2024-06-18 DIAGNOSIS — Z23 Encounter for immunization: Secondary | ICD-10-CM | POA: Diagnosis not present

## 2024-07-22 DIAGNOSIS — Z941 Heart transplant status: Secondary | ICD-10-CM | POA: Diagnosis not present

## 2024-07-22 DIAGNOSIS — M1991 Primary osteoarthritis, unspecified site: Secondary | ICD-10-CM | POA: Diagnosis not present

## 2024-07-22 DIAGNOSIS — K121 Other forms of stomatitis: Secondary | ICD-10-CM | POA: Diagnosis not present

## 2024-07-22 DIAGNOSIS — Z681 Body mass index (BMI) 19 or less, adult: Secondary | ICD-10-CM | POA: Diagnosis not present

## 2024-07-22 DIAGNOSIS — Z79899 Other long term (current) drug therapy: Secondary | ICD-10-CM | POA: Diagnosis not present

## 2024-07-22 DIAGNOSIS — M1009 Idiopathic gout, multiple sites: Secondary | ICD-10-CM | POA: Diagnosis not present

## 2024-07-24 DIAGNOSIS — E7800 Pure hypercholesterolemia, unspecified: Secondary | ICD-10-CM | POA: Diagnosis not present

## 2024-07-24 DIAGNOSIS — Z950 Presence of cardiac pacemaker: Secondary | ICD-10-CM | POA: Diagnosis not present

## 2024-07-24 DIAGNOSIS — Z45018 Encounter for adjustment and management of other part of cardiac pacemaker: Secondary | ICD-10-CM | POA: Diagnosis not present

## 2024-07-24 DIAGNOSIS — R42 Dizziness and giddiness: Secondary | ICD-10-CM | POA: Diagnosis not present

## 2024-07-24 DIAGNOSIS — M6281 Muscle weakness (generalized): Secondary | ICD-10-CM | POA: Diagnosis not present

## 2024-07-24 DIAGNOSIS — R151 Fecal smearing: Secondary | ICD-10-CM | POA: Diagnosis not present

## 2024-07-24 DIAGNOSIS — R197 Diarrhea, unspecified: Secondary | ICD-10-CM | POA: Diagnosis not present

## 2024-07-24 DIAGNOSIS — K8020 Calculus of gallbladder without cholecystitis without obstruction: Secondary | ICD-10-CM | POA: Diagnosis not present

## 2024-07-24 DIAGNOSIS — I495 Sick sinus syndrome: Secondary | ICD-10-CM | POA: Diagnosis not present

## 2024-07-24 DIAGNOSIS — Z79891 Long term (current) use of opiate analgesic: Secondary | ICD-10-CM | POA: Diagnosis not present

## 2024-07-24 DIAGNOSIS — R933 Abnormal findings on diagnostic imaging of other parts of digestive tract: Secondary | ICD-10-CM | POA: Diagnosis not present

## 2024-07-24 DIAGNOSIS — Z79899 Other long term (current) drug therapy: Secondary | ICD-10-CM | POA: Diagnosis not present

## 2024-08-05 DIAGNOSIS — R197 Diarrhea, unspecified: Secondary | ICD-10-CM | POA: Diagnosis not present

## 2024-08-12 DIAGNOSIS — L578 Other skin changes due to chronic exposure to nonionizing radiation: Secondary | ICD-10-CM | POA: Diagnosis not present

## 2024-08-12 DIAGNOSIS — Z85828 Personal history of other malignant neoplasm of skin: Secondary | ICD-10-CM | POA: Diagnosis not present

## 2024-08-12 DIAGNOSIS — L570 Actinic keratosis: Secondary | ICD-10-CM | POA: Diagnosis not present

## 2024-08-12 DIAGNOSIS — L821 Other seborrheic keratosis: Secondary | ICD-10-CM | POA: Diagnosis not present

## 2024-08-12 DIAGNOSIS — D485 Neoplasm of uncertain behavior of skin: Secondary | ICD-10-CM | POA: Diagnosis not present

## 2024-08-12 DIAGNOSIS — L219 Seborrheic dermatitis, unspecified: Secondary | ICD-10-CM | POA: Diagnosis not present

## 2024-08-12 DIAGNOSIS — D492 Neoplasm of unspecified behavior of bone, soft tissue, and skin: Secondary | ICD-10-CM | POA: Diagnosis not present

## 2024-08-12 DIAGNOSIS — D849 Immunodeficiency, unspecified: Secondary | ICD-10-CM | POA: Diagnosis not present

## 2024-08-13 DIAGNOSIS — N3001 Acute cystitis with hematuria: Secondary | ICD-10-CM | POA: Diagnosis not present

## 2024-09-09 ENCOUNTER — Ambulatory Visit: Admitting: Neurology

## 2024-09-12 NOTE — Telephone Encounter (Signed)
 2026 Prolia  benefits due to re-verify on [employer].

## 2024-09-14 NOTE — Progress Notes (Signed)
 [address] [facility] [address] - AMBULATORY [facility]  - URGENT CARE [place] [address] [facility] [place] [id number]   Date of Service: 09/14/2024 Patient DOB: [birth year]    History of Present Illness   Patient ID: Cassie Harvey is a 78 y.o. female. Patient presents to urgent care due to urinary symptoms for approximately 1 week.  Reports dysuria and frequency.  Also notes sore throat.  Does report being diagnosed with a UTI approximately 1 month ago in which she was treated and symptoms did resolve.  States that she does have incontinence at baseline to which she wears pads and tries to change regularly.  States that she has seen a urologist in the past.  Denies fever, body aches, chills, abdominal pain, flank pain, hematuria, vaginal discharge.  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/17/2014   History of heart transplant    (CMD) 12/14/2011   SSS (sick sinus syndrome)    (CMD) 08/07/2011   Abnormality of gait due to impairment of balance 12/18/2017   Cardiac pacemaker in situ 08/07/2011   Hypercholesterolemia 02/18/2015   Lumbar degenerative disc disease 11/21/2011   Lumbar radicular pain 02/23/2014   Pelvic floor dysfunction 08/24/2015   Peripheral neuropathy 06/10/2012   Primary gout 10/27/2021   Primary osteoarthritis 10/27/2021   DJD (degenerative joint disease) of knee 06/10/2012   Vaginal atrophy 10/27/2021   Immunosuppressed status (CMD) 10/27/2021   Burning tongue syndrome 08/16/2022   Bacteriuria 05/23/2023   Urgency of urination 05/23/2023   Recurrent urinary tract infection 05/23/2023   History of right mastoidectomy 04/11/2024   Resolved Ambulatory Problems    Diagnosis Date Noted   No Resolved Ambulatory Problems   Past Medical History:  Diagnosis Date    Arthritis    Cerumen impaction    Chronic rhinitis    Cough    Dysfunction of eustachian tube    Esophageal reflux    Gout    Hearing loss    History of impacted cerumen    Mixed conductive and sensorineural hearing loss    Pure hypercholesterolemia     BP 164/87   Pulse 92   Ht 1.651 m (5' 5)   Wt 50.8 kg (112 lb)   BMI 18.64 kg/m    Review of Systems   Review of Systems  Genitourinary:  Positive for dysuria and frequency.     Physicial Exam   Physical Exam Vitals and nursing note reviewed.  Constitutional:      Appearance: Normal appearance.  HENT:     Head: Normocephalic and atraumatic.     Right Ear: External ear normal.     Left Ear: External ear normal.     Nose: Nose normal.     Mouth/Throat:     Mouth: Mucous membranes are moist.     Pharynx: Oropharynx is clear. Uvula midline. Posterior oropharyngeal erythema present. No oropharyngeal exudate.     Tonsils: No tonsillar exudate or tonsillar abscesses.  Eyes:     Extraocular Movements: Extraocular movements intact.     Conjunctiva/sclera: Conjunctivae normal.  Cardiovascular:     Rate and Rhythm: Normal rate and regular rhythm.     Pulses: Normal pulses.     Heart sounds: Normal heart sounds.  Pulmonary:     Effort: Pulmonary effort is normal.  Breath sounds: Normal breath sounds.  Abdominal:     General: Abdomen is flat. Bowel sounds are normal. There is no distension.     Palpations: Abdomen is soft. There is no mass.     Tenderness: There is abdominal tenderness (Suprapubic region). There is no right CVA tenderness, left CVA tenderness, guarding or rebound.     Hernia: No hernia is present.  Musculoskeletal:        General: Normal range of motion.     Cervical back: Normal range of motion and neck supple.  Skin:    General: Skin is warm and dry.  Neurological:     General: No focal deficit present.     Mental Status: She is alert and oriented to person, place, and time.     Gait:  Gait abnormal (Using walker).  Psychiatric:        Mood and Affect: Mood normal.      Labs   Recent Results (from the past 24 hours)  POC Urinalysis Auto without Microscopic   Collection Time: 09/14/24  2:10 PM  Result Value Ref Range   Color, Urine Yellow Yellow   Clarity, Urine Turbid (A) Clear   Glucose, Urine Negative Negative mg/dL   Bilirubin, Urine Negative Negative   Ketones, Urine Negative Negative mg/dL   Specific Gravity, Urine 1.015 1.010, 1.015, 1.020, 1.025   Blood, Urine Moderate (A) Negative   pH, Urine 6.0 5.0, 5.5, 6.0, 6.5, 7.0, 7.5, 8.0   Protein, Urine 30 (A) Negative mg/dL   Urobilinogen, Urine 0.2 <2.0 mg/dL   Nitrite, Urine Positive (A) Negative   Leukocyte Esterase, Urine Large (A) Negative   Kit/Device Lot # [id number]    Kit/Device Expiration Date 03/24/25      Diagnosis   Selam was seen today for urinary tract infection.  Diagnoses and all orders for this visit:  Urinary tract infection without hematuria, site unspecified -     Urine Culture -     amoxicillin-pot clavulanate (AUGMENTIN) 875-125 mg per tablet; Take 1 tablet by mouth 2 (two) times a day for 7 days.  Dysuria -     POC Urinalysis Auto without Microscopic     Medical Decision Making [name] is a 78 y.o. female who presents to urgent care today with complaints of  Chief Complaint  Patient presents with   Urinary Tract Infection    Uti has come  back. Also has a sore throat for over week. No fevers.     On exam, VS stable and patient is afebrile. Appears well-hydrated and capillary refill <2 seconds.  Ddx: UTI, Pyelonephritis, Nephrolithiasis, overactive bladder  Patient presents to urgent care due to urinary symptoms.  Urine dip significant for blood, nitrites, leukocytes; will send for culture.  Rx Augmentin.  Supportive care and return precautions provided.  Discharge Information  Symptomatic management discussed.  Patient was given verbal and written  instructions on symptoms that necessitate return to the UC/ED, and instructed to f/u w/ UC or PCP if not improving in expected timeframe.   Patient/parent has been instructed on RX/OTC medications, dosages, side effects, and possible interactions as associated with each diagnosis in my impression and plan above.   Patient education (verbal/handout) given on diagnosis, pathophysiology, treatment of diagnosis, side effects of medication use for treatment, restrictions while taking medication, supportives measures such as staying hydrated.   Red Flags associated with diagnosis/es were reviewed and patient instructed on action plan if red flags develop.   They have been  instructed that if symptoms worsen or red flags develop they should return to Urgent Care, go to the nearest ED, or activate EMS/911.     Patient and/or parent/guardian (if applicable) agreed with plan and voiced understanding.  No barriers to adherence perceived by myself.   Portions of this note may have been dictated using Dragon dictation software/hardware and may contain grammatical or spelling errors.    If a new prescription was given today, then I discussed potential side effects, drug interactions, instructions for taking the medication, and the consequences of not taking it.    F/u: Follow up closely with primary care provider (PCP) and other specialists for further care and routine care, but seek medical attention sooner if worsening/concerning signs or symptoms.  Home Care   Electronically signed by: Darryle Slater Fish, PA-C 09/14/2024 2:48 PM

## 2024-09-21 ENCOUNTER — Observation Stay (HOSPITAL_COMMUNITY)
Admission: EM | Admit: 2024-09-21 | Discharge: 2024-09-22 | Disposition: A | Attending: Internal Medicine | Admitting: Internal Medicine

## 2024-09-21 ENCOUNTER — Encounter (HOSPITAL_COMMUNITY): Payer: Self-pay

## 2024-09-21 ENCOUNTER — Emergency Department (HOSPITAL_COMMUNITY)

## 2024-09-21 DIAGNOSIS — M109 Gout, unspecified: Secondary | ICD-10-CM | POA: Diagnosis not present

## 2024-09-21 DIAGNOSIS — N1832 Chronic kidney disease, stage 3b: Secondary | ICD-10-CM | POA: Insufficient documentation

## 2024-09-21 DIAGNOSIS — G9341 Metabolic encephalopathy: Secondary | ICD-10-CM | POA: Diagnosis not present

## 2024-09-21 DIAGNOSIS — Z96641 Presence of right artificial hip joint: Secondary | ICD-10-CM | POA: Diagnosis not present

## 2024-09-21 DIAGNOSIS — Z950 Presence of cardiac pacemaker: Secondary | ICD-10-CM | POA: Diagnosis not present

## 2024-09-21 DIAGNOSIS — I495 Sick sinus syndrome: Secondary | ICD-10-CM | POA: Diagnosis not present

## 2024-09-21 DIAGNOSIS — Z8616 Personal history of COVID-19: Secondary | ICD-10-CM | POA: Diagnosis not present

## 2024-09-21 DIAGNOSIS — R4182 Altered mental status, unspecified: Secondary | ICD-10-CM | POA: Diagnosis present

## 2024-09-21 DIAGNOSIS — U071 COVID-19: Principal | ICD-10-CM | POA: Insufficient documentation

## 2024-09-21 DIAGNOSIS — Z941 Heart transplant status: Secondary | ICD-10-CM | POA: Insufficient documentation

## 2024-09-21 DIAGNOSIS — N3000 Acute cystitis without hematuria: Secondary | ICD-10-CM

## 2024-09-21 DIAGNOSIS — Z96651 Presence of right artificial knee joint: Secondary | ICD-10-CM | POA: Diagnosis not present

## 2024-09-21 DIAGNOSIS — N390 Urinary tract infection, site not specified: Secondary | ICD-10-CM | POA: Diagnosis not present

## 2024-09-21 DIAGNOSIS — R0602 Shortness of breath: Secondary | ICD-10-CM | POA: Diagnosis present

## 2024-09-21 LAB — URINALYSIS, W/ REFLEX TO CULTURE (INFECTION SUSPECTED)
Bilirubin Urine: NEGATIVE
Glucose, UA: NEGATIVE mg/dL
Hgb urine dipstick: NEGATIVE
Ketones, ur: NEGATIVE mg/dL
Nitrite: POSITIVE — AB
Protein, ur: 30 mg/dL — AB
Specific Gravity, Urine: 1.009 (ref 1.005–1.030)
WBC, UA: >50 WBC/hpf (ref 0–5)
pH: 6 (ref 5.0–8.0)

## 2024-09-21 LAB — CBC WITH DIFFERENTIAL/PLATELET
Abs Immature Granulocytes: 0.06 K/uL (ref 0.00–0.07)
Basophils Absolute: 0.1 K/uL (ref 0.0–0.1)
Basophils Relative: 1 %
Eosinophils Absolute: 0 K/uL (ref 0.0–0.5)
Eosinophils Relative: 0 %
HCT: 36.6 % (ref 36.0–46.0)
Hemoglobin: 12.1 g/dL (ref 12.0–15.0)
Immature Granulocytes: 1 %
Lymphocytes Relative: 6 %
Lymphs Abs: 0.7 K/uL (ref 0.7–4.0)
MCH: 36.3 pg — ABNORMAL HIGH (ref 26.0–34.0)
MCHC: 33.1 g/dL (ref 30.0–36.0)
MCV: 109.9 fL — ABNORMAL HIGH (ref 80.0–100.0)
Monocytes Absolute: 1.2 K/uL — ABNORMAL HIGH (ref 0.1–1.0)
Monocytes Relative: 11 %
Neutro Abs: 8.5 K/uL — ABNORMAL HIGH (ref 1.7–7.7)
Neutrophils Relative %: 81 %
Platelets: 308 K/uL (ref 150–400)
RBC: 3.33 MIL/uL — ABNORMAL LOW (ref 3.87–5.11)
RDW: 14.4 % (ref 11.5–15.5)
WBC: 10.5 K/uL (ref 4.0–10.5)
nRBC: 0 % (ref 0.0–0.2)

## 2024-09-21 LAB — CBG MONITORING, ED: Glucose-Capillary: 119 mg/dL — ABNORMAL HIGH (ref 70–99)

## 2024-09-21 LAB — COMPREHENSIVE METABOLIC PANEL WITH GFR
ALT: 19 U/L (ref 0–44)
AST: 30 U/L (ref 15–41)
Albumin: 4.4 g/dL (ref 3.5–5.0)
Alkaline Phosphatase: 43 U/L (ref 38–126)
Anion gap: 17 — ABNORMAL HIGH (ref 5–15)
BUN: 45 mg/dL — ABNORMAL HIGH (ref 8–23)
CO2: 21 mmol/L — ABNORMAL LOW (ref 22–32)
Calcium: 10.6 mg/dL — ABNORMAL HIGH (ref 8.9–10.3)
Chloride: 101 mmol/L (ref 98–111)
Creatinine, Ser: 2.22 mg/dL — ABNORMAL HIGH (ref 0.44–1.00)
GFR, Estimated: 22 mL/min — ABNORMAL LOW
Glucose, Bld: 116 mg/dL — ABNORMAL HIGH (ref 70–99)
Potassium: 4.4 mmol/L (ref 3.5–5.1)
Sodium: 139 mmol/L (ref 135–145)
Total Bilirubin: 0.4 mg/dL (ref 0.0–1.2)
Total Protein: 7.3 g/dL (ref 6.5–8.1)

## 2024-09-21 LAB — PROTIME-INR
INR: 1 (ref 0.8–1.2)
Prothrombin Time: 14 s (ref 11.4–15.2)

## 2024-09-21 LAB — I-STAT CG4 LACTIC ACID, ED: Lactic Acid, Venous: 1.2 mmol/L (ref 0.5–1.9)

## 2024-09-21 MED ORDER — ACETAMINOPHEN 500 MG PO TABS
1000.0000 mg | ORAL_TABLET | Freq: Once | ORAL | Status: AC
  Administered 2024-09-21: 1000 mg via ORAL
  Filled 2024-09-21: qty 2

## 2024-09-21 NOTE — ED Provider Triage Note (Signed)
 Emergency Medicine Provider Triage Evaluation Note  Cassie Harvey , a 78 y.o. female  was evaluated in triage.  Pt complains of AMS, urinary incontinence, generalized weakness.  Has been recovering from a UTI.  Has reportedly been incontinent all day today.  Has had trouble standing due to generalized weakness.  Review of Systems  Positive: Urinary incontinence Negative:   Physical Exam  BP (!) 144/79 (BP Location: Right Arm)   Pulse 94   Temp (!) 101.7 F (38.7 C) (Oral)   Resp 18   SpO2 96%  Gen:   Awake, no distress   Resp:  Normal effort  MSK:   Moves extremities without difficulty  Other:    Medical Decision Making  Medically screening exam initiated at 10:29 PM.  Appropriate orders placed.  Cassie Harvey was informed that the remainder of the evaluation will be completed by another provider, this initial triage assessment does not replace that evaluation, and the importance of remaining in the ED until their evaluation is complete.  Needs a room   Cassie Harvey, Cassie Harvey 09/21/24 2231

## 2024-09-21 NOTE — ED Triage Notes (Addendum)
 Pt to ED via [facility] from home c/o AMS. Pt last known well 1 pm , Apparently with time pt has become more oriented. Pt answering most questions appropriately, but slow to respond. Reports has care giver at home.    Flu like symptoms x 3 days.   Recently treated for UTI, completed ABX yesterday.   No medications given by EMS.  Last CD:UZFE 100, CBG 156, 146/80, P 100  Hx heart transplant 2004

## 2024-09-22 ENCOUNTER — Encounter (HOSPITAL_COMMUNITY): Payer: Self-pay | Admitting: Family Medicine

## 2024-09-22 ENCOUNTER — Other Ambulatory Visit (HOSPITAL_COMMUNITY): Payer: Self-pay

## 2024-09-22 DIAGNOSIS — G9341 Metabolic encephalopathy: Secondary | ICD-10-CM | POA: Diagnosis not present

## 2024-09-22 LAB — CBC
HCT: 31.6 % — ABNORMAL LOW (ref 36.0–46.0)
Hemoglobin: 10.5 g/dL — ABNORMAL LOW (ref 12.0–15.0)
MCH: 36.6 pg — ABNORMAL HIGH (ref 26.0–34.0)
MCHC: 33.2 g/dL (ref 30.0–36.0)
MCV: 110.1 fL — ABNORMAL HIGH (ref 80.0–100.0)
Platelets: 254 K/uL (ref 150–400)
RBC: 2.87 MIL/uL — ABNORMAL LOW (ref 3.87–5.11)
RDW: 14.3 % (ref 11.5–15.5)
WBC: 9.1 K/uL (ref 4.0–10.5)
nRBC: 0 % (ref 0.0–0.2)

## 2024-09-22 LAB — BASIC METABOLIC PANEL WITH GFR
Anion gap: 15 (ref 5–15)
BUN: 48 mg/dL — ABNORMAL HIGH (ref 8–23)
CO2: 20 mmol/L — ABNORMAL LOW (ref 22–32)
Calcium: 9.7 mg/dL (ref 8.9–10.3)
Chloride: 103 mmol/L (ref 98–111)
Creatinine, Ser: 2.3 mg/dL — ABNORMAL HIGH (ref 0.44–1.00)
GFR, Estimated: 21 mL/min — ABNORMAL LOW
Glucose, Bld: 104 mg/dL — ABNORMAL HIGH (ref 70–99)
Potassium: 4.1 mmol/L (ref 3.5–5.1)
Sodium: 138 mmol/L (ref 135–145)

## 2024-09-22 LAB — RESP PANEL BY RT-PCR (RSV, FLU A&B, COVID)  RVPGX2
Influenza A by PCR: NEGATIVE
Influenza B by PCR: NEGATIVE
Resp Syncytial Virus by PCR: NEGATIVE
SARS Coronavirus 2 by RT PCR: POSITIVE — AB

## 2024-09-22 LAB — I-STAT CG4 LACTIC ACID, ED: Lactic Acid, Venous: 0.7 mmol/L (ref 0.5–1.9)

## 2024-09-22 MED ORDER — MIRABEGRON ER 25 MG PO TB24
25.0000 mg | ORAL_TABLET | Freq: Every day | ORAL | Status: DC
  Administered 2024-09-22: 25 mg via ORAL
  Filled 2024-09-22: qty 1

## 2024-09-22 MED ORDER — SODIUM CHLORIDE 0.9% FLUSH
3.0000 mL | Freq: Two times a day (BID) | INTRAVENOUS | Status: DC
  Administered 2024-09-22: 3 mL via INTRAVENOUS

## 2024-09-22 MED ORDER — CARVEDILOL 3.125 MG PO TABS
3.1250 mg | ORAL_TABLET | Freq: Two times a day (BID) | ORAL | Status: DC
  Administered 2024-09-22: 3.125 mg via ORAL
  Filled 2024-09-22: qty 1

## 2024-09-22 MED ORDER — EZETIMIBE 10 MG PO TABS: 10.0000 mg | ORAL_TABLET | Freq: Every evening | ORAL | Status: DC

## 2024-09-22 MED ORDER — SODIUM CHLORIDE 0.9 % IV SOLN
2.0000 g | Freq: Once | INTRAVENOUS | Status: AC
  Administered 2024-09-22: 2 g via INTRAVENOUS
  Filled 2024-09-22: qty 12.5

## 2024-09-22 MED ORDER — PREDNISONE 5 MG PO TABS
5.0000 mg | ORAL_TABLET | Freq: Every day | ORAL | Status: DC
  Administered 2024-09-22: 5 mg via ORAL
  Filled 2024-09-22: qty 1

## 2024-09-22 MED ORDER — CEPHALEXIN 250 MG PO CAPS
250.0000 mg | ORAL_CAPSULE | Freq: Two times a day (BID) | ORAL | 0 refills | Status: DC
  Filled 2024-09-22: qty 14, 7d supply, fill #0

## 2024-09-22 MED ORDER — LOSARTAN POTASSIUM 50 MG PO TABS
25.0000 mg | ORAL_TABLET | Freq: Every day | ORAL | Status: DC
  Administered 2024-09-22: 25 mg via ORAL
  Filled 2024-09-22: qty 1

## 2024-09-22 MED ORDER — VALACYCLOVIR HCL 500 MG PO TABS
500.0000 mg | ORAL_TABLET | Freq: Every day | ORAL | Status: DC
  Administered 2024-09-22: 500 mg via ORAL
  Filled 2024-09-22: qty 1

## 2024-09-22 MED ORDER — SODIUM CHLORIDE 0.9 % IV SOLN: 2.0000 g | INTRAVENOUS | Status: DC

## 2024-09-22 MED ORDER — HEPARIN SODIUM (PORCINE) 5000 UNIT/ML IJ SOLN
5000.0000 [IU] | Freq: Three times a day (TID) | INTRAMUSCULAR | Status: DC
  Administered 2024-09-22: 5000 [IU] via SUBCUTANEOUS
  Filled 2024-09-22: qty 1

## 2024-09-22 MED ORDER — CEPHALEXIN 500 MG PO CAPS: 500.0000 mg | ORAL_CAPSULE | Freq: Two times a day (BID) | ORAL | 0 refills | Status: DC

## 2024-09-22 MED ORDER — FOLIC ACID 1 MG PO TABS
1.0000 mg | ORAL_TABLET | Freq: Every day | ORAL | Status: DC
  Administered 2024-09-22: 1 mg via ORAL
  Filled 2024-09-22: qty 1

## 2024-09-22 MED ORDER — MYCOPHENOLATE MOFETIL 250 MG PO CAPS
500.0000 mg | ORAL_CAPSULE | Freq: Two times a day (BID) | ORAL | Status: DC
  Filled 2024-09-22 (×2): qty 2

## 2024-09-22 MED ORDER — ASPIRIN 81 MG PO CHEW: 81.0000 mg | CHEWABLE_TABLET | Freq: Every evening | ORAL | Status: DC

## 2024-09-22 MED ORDER — CEFTRIAXONE SODIUM 1 G IJ SOLR
1.0000 g | Freq: Once | INTRAMUSCULAR | Status: AC
  Administered 2024-09-22: 1 g via INTRAVENOUS
  Filled 2024-09-22: qty 10

## 2024-09-22 MED ORDER — CEPHALEXIN 250 MG PO CAPS
250.0000 mg | ORAL_CAPSULE | Freq: Two times a day (BID) | ORAL | Status: DC
  Administered 2024-09-22: 250 mg via ORAL
  Filled 2024-09-22: qty 1

## 2024-09-22 MED ORDER — CYCLOSPORINE MODIFIED (GENGRAF) 25 MG PO CAPS
50.0000 mg | ORAL_CAPSULE | Freq: Two times a day (BID) | ORAL | Status: DC
  Filled 2024-09-22 (×2): qty 2

## 2024-09-22 MED ORDER — ACETAMINOPHEN 325 MG PO TABS
650.0000 mg | ORAL_TABLET | Freq: Four times a day (QID) | ORAL | Status: DC | PRN
  Administered 2024-09-22: 650 mg via ORAL
  Filled 2024-09-22: qty 2

## 2024-09-22 MED ORDER — ALLOPURINOL 100 MG PO TABS
300.0000 mg | ORAL_TABLET | Freq: Every day | ORAL | Status: DC
  Administered 2024-09-22: 300 mg via ORAL
  Filled 2024-09-22: qty 3

## 2024-09-22 NOTE — Discharge Instructions (Signed)
 SABRA

## 2024-09-22 NOTE — Evaluation (Signed)
 Physical Therapy Brief Evaluation and Discharge Note Patient Details Name: Cassie Harvey MRN: [id number] DOB: [birth year] Today's Date: 09/22/2024   History of Present Illness  78 y.o. female with NICM s/p heart transplant 2004, PPM, HTN, OAB who is presenting with acute confusion and weakness. UTI+, Covid+.  Clinical Impression  Pt doing well with mobility and no further PT needed.  Ready for dc from PT standpoint.        PT Assessment Patient does not need any further PT services  Assistance Needed at Discharge  PRN    Equipment Recommendations None recommended by PT  Recommendations for Other Services       Precautions/Restrictions Precautions Precautions: Other (comment) Recall of Precautions/Restrictions: Intact Precaution/Restrictions Comments: covid        Mobility  Bed Mobility Rolling: Independent Supine/Sidelying to sit: Modified independent (Device/Increased time) Sit to supine/sidelying: Modified independent (Device/Increased time)    Transfers Overall transfer level: Modified independent Equipment used: Rolling walker (2 wheels) Transfers: Sit to/from Stand Sit to Stand: Modified independent (Device/Increase time)                Ambulation/Gait Ambulation/Gait assistance: Modified independent (Device/Increase time) Gait Distance (Feet): 150 Feet Assistive device: Rolling walker (2 wheels) Gait Pattern/deviations: Step-through pattern, Decreased stride length Gait Speed: Below normal General Gait Details: Baseline limp on lt. Steady gait with walker  Home Activity Instructions    Stairs            Modified Rankin (Stroke Patients Only)        Balance Overall balance assessment: Needs assistance Sitting-balance support: No upper extremity supported, Feet unsupported Sitting balance-Leahy Scale: Good     Standing balance support: No upper extremity supported Standing balance-Leahy Scale: Fair            Pertinent  Vitals/Pain PT - Brief Vital Signs All Vital Signs Stable: Yes Pain Assessment Pain Assessment: No/denies pain     Home Living Family/patient expects to be discharged to:: Private residence Living Arrangements: Alone Available Help at Discharge: Friend(s);Available PRN/intermittently Home Environment: Stairs to enter;Rail - left;Rail - right  Stairs-Number of Steps: 3-4 Home Equipment: Cane - single point;BSC/3in1;Shower seat;Transport Restaurant Manager, [employer] (2 wheels)        Prior Function Level of Independence: Independent with assistive device(s) Comments: uses rolling walker    UE/LE Assessment   UE ROM/Strength/Tone/Coordination: WFL    LE ROM/Strength/Tone/Coordination: [facility]      Communication   Communication Communication: Impaired Factors Affecting Communication: Hearing impaired     Cognition Overall Cognitive Status: Appears within functional limits for tasks assessed/performed       General Comments      Exercises     Assessment/Plan    PT Problem List         PT Visit Diagnosis Other abnormalities of gait and mobility (R26.89)    No Skilled PT Patient is modified independent with all activity/mobility   Co-evaluation                AMPAC 6 Clicks Help needed turning from your back to your side while in a flat bed without using bedrails?: None Help needed moving from lying on your back to sitting on the side of a flat bed without using bedrails?: None Help needed moving to and from a bed to a chair (including a wheelchair)?: None Help needed standing up from a chair using your arms (e.g., wheelchair or bedside chair)?: None Help needed to walk in hospital room?: None Help needed  climbing 3-5 steps with a railing? : None 6 Click Score: 24      End of Session   Activity Tolerance: Patient tolerated treatment well Patient left: in bed;with call bell/phone within reach   PT Visit Diagnosis: Other abnormalities of gait and mobility  (R26.89)     Time: 8542-8494 PT Time Calculation (min) (ACUTE ONLY): 8 min  Charges:   PT Evaluation $PT Eval Low Complexity: 1 Low      [facility] PT Acute Rehabilitation Services Office [phone]   [facility]  09/22/2024, 3:16 PM

## 2024-09-22 NOTE — ED Notes (Signed)
 This RN attempted to give meds as ordered. Pt insisted on taking all her home meds at the same time. Meds rescheduled for 0800. Pharmacy made aware

## 2024-09-22 NOTE — H&P (Addendum)
 " History and Physical    Patient: Cassie Harvey FMW:[id number] DOB: 12/28/1945 DOA: 09/21/2024 DOS: the patient was seen and examined on 09/22/2024 PCP: Tobie Nanci Baptise, MD  Patient coming from: Home  Chief Complaint:  Chief Complaint  Patient presents with   Altered Mental Status   HPI: Cassie Harvey is a 78 y.o. female with NICM s/p heart transplant 2004, SSS s/p PPM, HTN, OAB who is presenting with acute confusion and weakness. Per caregiver, patient developed difficulty standing and intermittent nonsensical speech earlier today. Since arrival, she has been intermittently confused, able to answer questions appropriately at times but not consistently at baseline.  Patient seen in hallway. She knows day, date, time, situation, birthday. Completed the prescribed course of augmentin from urgent care over the past week, but still with increased urinary incontinence from baseline and burning with urination. Has also had fevers off and on for a few days.    In the ED she was febrile to 101.30F, HR 94, BP 144/79. Breathing normally without hypoxia on room air. SARS-CoV-2 PCR was positive, CXR clear. Urinalysis consistent with UTI for which ceftriaxone  was given.WBC 10.5k with neutrophilic predominance (PMNs 8.5k). She remains hemodynamically stable without hypotension or tachycardia; lactate is normal. Admission requested for altered mental status.   Review of Systems: As mentioned in the history of present illness. All other systems reviewed and are negative. Past Medical History:  Diagnosis Date   Broken shoulder    right   Chronic pain    DDD (degenerative disc disease), lumbar    DJD (degenerative joint disease)    Family history of anesthesia complication    My father suffered from a dysrythmia   Heart transplanted [facility]) 2004   History of poliomyelitis 10/15/2013   Hypertension    IBS (irritable bowel syndrome)    Immunosuppression    Molar pregnancy    Tx; with chemo   Mouth  ulcers    Neuropathy, peripheral    Pacemaker 01/13/2003   Pneumonia    PONV (postoperative nausea and vomiting)    Urinary frequency    Wrist fracture, bilateral    Surgical repair left   Past Surgical History:  Procedure Laterality Date   APPENDECTOMY     BIOPSY  12/08/2022   Procedure: BIOPSY;  Surgeon: Rollin Dover, MD;  Location: [facility];  Service: Gastroenterology;;   CARDIAC CATHETERIZATION  2003   2015   COLONOSCOPY     COLONOSCOPY N/A 01/12/2014   Procedure: COLONOSCOPY;  Surgeon: Renaye Sous, MD;  Location: [facility];  Service: Endoscopy;  Laterality: N/A;   COLONOSCOPY WITH PROPOFOL  N/A 12/08/2022   Procedure: COLONOSCOPY WITH PROPOFOL ;  Surgeon: Rollin Dover, MD;  Location: [facility];  Service: Gastroenterology;  Laterality: N/A;   DILATION AND CURETTAGE OF UTERUS  1988   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   EAR EXAMINATION UNDER ANESTHESIA  1977   lt middle ear    ELBOW ARTHROPLASTY     rt   HEART TRANSPLANT  11/2002   INJECTION KNEE Left 01/16/2013   Procedure: KNEE INJECTION;  Surgeon: Toribio JULIANNA Chancy, MD;  Location: [facility];  Service: Orthopedics;  Laterality: Left;  STEROID    KNEE ARTHROSCOPY  1998   rt   KNEE ARTHROSCOPY WITH MEDIAL MENISECTOMY Right 01/16/2013   Procedure: RIGHT KNEE ARTHROSCOPY WITH MEDIAL AND LATERAL MENISECTOMY, REMOVAL LOOSE FOREIGN BODY AND STEROID INJECTION;  Surgeon: Toribio JULIANNA Chancy, MD;  Location: [facility];  Service: Orthopedics;  Laterality: Right;  DEPRIEDMENT LATERAL MENISCUS AND CHONDROPLASTY   PACEMAKER INSERTION  7995,7986   new dual chamber -medtronic 5076-art/vent leads   SEPTOPLASTY  1992   SHOULDER ARTHROSCOPY  96,02   right abd left   STERIOD INJECTION Left 01/16/2013   Procedure: STEROID INJECTION;  Surgeon: Toribio JULIANNA Chancy, MD;  Location: [facility];  Service: Orthopedics;  Laterality: Left;   TOTAL HIP ARTHROPLASTY  02/2011   rt   TOTAL KNEE ARTHROPLASTY Right  02/25/2014   Procedure: RIGHT TOTAL KNEE ARTHROPLASTY;  Surgeon: Toribio JULIANNA Chancy, MD;  Location: [facility];  Service: Orthopedics;  Laterality: Right;   TUBAL LIGATION  1990   WRIST ARTHROPLASTY  2008   left   Social History:  reports that she has never smoked. She has never used smokeless tobacco. She reports that she does not drink alcohol and does not use drugs.  Allergies[1]  Family History  Problem Relation Age of Onset   Pulmonary fibrosis Father     Prior to Admission medications  Medication Sig Start Date End Date Taking? Authorizing Provider  acetaminophen  (TYLENOL ) 500 MG tablet Take 500-1,000 mg by mouth every 6 (six) hours as needed for moderate pain.    [provider]  allopurinol  (ZYLOPRIM ) 300 MG tablet Take 300 mg by mouth daily.    [provider]  Ascorbic Acid (VITAMIN C) 1000 MG tablet Take 1,000 mg by mouth daily.    [provider]  ascorbic acid (VITAMIN C) 500 MG tablet Take 500 mg by mouth daily.    [provider]  ASPIRIN  81 PO Take 81 mg by mouth every evening.    [provider]  B Complex-C (SUPER B COMPLEX PO) Take 1 tablet by mouth daily.    [provider]  Calcium Carbonate-Vit D-Min (GNP CALCIUM PLUS 600 +D) 600-200 MG-UNIT TABS Take 1 tablet by mouth daily.    [provider]  carvedilol  (COREG ) 3.125 MG tablet Take 3.125 mg by mouth 2 (two) times daily with a meal.    [provider]  Cholecalciferol (VITAMIN D ) 50 MCG (2000 UT) tablet Take 2,000-4,000 Units by mouth See admin instructions. Take 4000 units in the morning and 2000 units in the evening    [provider]  Cranberry-Vitamin C (CRANBERRY CONCENTRATE/VITAMINC PO) Take 1 capsule by mouth 2 (two) times daily.    [provider]  cycloSPORINE  modified (NEORAL ) 25 MG capsule Take 50 mg by mouth 2 (two) times daily.    [provider]  DULoxetine  (CYMBALTA ) 30 MG capsule TAKE 1 CAPSULE(30 MG) BY MOUTH  AT BEDTIME 03/12/24   Gayland Lauraine PARAS, NP  ezetimibe (ZETIA) 10 MG tablet Take 10 mg by mouth every evening.    [provider]  fluorouracil (EFUDEX) 5 % cream Apply 1 Application topically daily as needed (precancerous spots).    [provider]  folic acid  (FOLVITE ) 1 MG tablet Take 1 mg by mouth daily. 09/23/17   [provider]  furosemide (LASIX) 20 MG tablet Take 20 mg by mouth.    [provider]  losartan  (COZAAR ) 25 MG tablet Take by mouth.    [provider]  Menatetrenone (VITAMIN K2) 100 MCG TABS Take 100 mcg by mouth daily.    [provider]  Multiple Vitamin (MULTIVITAMIN WITH MINERALS) TABS tablet Take 1 tablet by mouth daily.    [provider]  mycophenolate  (CELLCEPT ) 500 MG tablet Take 500 mg by mouth 2 (two) times daily.  [provider]  MYRBETRIQ  25 MG TB24 tablet Take 25 mg by mouth daily. 03/12/24   [provider]  niacin  (VITAMIN B3) 250 MG tablet Take 250 mg by mouth every evening. Take with 500 mg to equal 750 mg daily    [provider]  niacin  (VITAMIN B3) 500 MG tablet Take 500 mg by mouth every evening. Take with 250 mg to equal 750 mg daily    [provider]  Niacinamide-Zn-Cu-Methfo-Se-Cr (NICOTINAMIDE PO) Take 1 tablet by mouth 2 (two) times daily.    [provider]  Omega-3 Fatty Acids (FISH OIL) 1000 MG CAPS Take 1,000 mg by mouth 3 (three) times daily.    [provider]  ondansetron  (ZOFRAN -ODT) 8 MG disintegrating tablet Take 8 mg by mouth every 8 (eight) hours as needed for vomiting or nausea.    [provider]  solifenacin (VESICARE) 10 MG tablet Take 10 mg by mouth every evening.    [provider]  tretinoin  (RETIN-A ) 0.05 % cream Apply 1 Application topically at bedtime.    [provider]  UNABLE TO FIND Take 1 capsule by mouth daily. metagenics-ultraflora IB 60BN CFu    [provider]   valACYclovir  (VALTREX ) 500 MG tablet Take 500 mg by mouth daily with lunch.    [provider]    Physical Exam: Vitals:   09/21/24 2201  BP: (!) 144/79  Pulse: 94  Resp: 18  Temp: (!) 101.7 F (38.7 C)  TempSrc: Oral  SpO2: 96%  Gen: Elderly, well-appearing female in no distress Pulm: Clear and nonlabored  CV: RRR, no MRG, no edema GI: Soft, NT, ND, +BS Neuro: Alert and oriented at this time. No new focal deficits. Ext: Warm, no deformities. Skin: No acute rashes, lesions or ulcers on visualized skin   Data Reviewed: WBC 10.5k (8.5k PMNs, ALC 700) Hgb 12.1g/dl macrocytic (MCV 890.0) Plt and LFTs wnl.  Cr 2.22 (baseline may be ~2.1), BUN 45. Bicarb 21 w/AG 17. Glucose 116. Lactic acid normal at 1.2, no ketones in urine.  Calcium 10.6 (albumin 4.4).  SARS-CoV-2 PCR positive.  UA: many bacteria, >50 WBC/HPF, no RBCs, +hyaline casts.  ECG: Sinus rhythm with nonspecific ST changes and conduction abnormalities consistent with pacemaker and heart transplant.  Assessment and Plan: 78 year old immunosuppressed heart-transplant recipient with acute confusion and weakness, likely secondary to infection (UTI  COVID). Hemodynamically stable without respiratory compromise.   Acute metabolic encephalopathy: Due to UTI and covid-19 infection. Increased susceptibility for AMS w/infection with CT head June 2025 showing extensive microvascular disease and generalized atrophy.  No focal deficits on today's exam.  - Delirium precautions - Defer further work up at this time, initiate if no response to antibiotics and supportive care for covid-19 infection.  - Given reports of weakness, will get PT eval in AM.  UTI: Nitrite-positive pyuria despite reportedly appropriate course of augmentin started 12/21. Also with neutrophilia and fever. HR also >90. UCx 02/13/2024 w/E. coli resistant to amp, amp/sulbactam, cipro, and pip/tazo (suspect this and her immunosuppressed status is continued  resistance pattern explaining failure w/augmentin). Urine culture from UC visit 12/21 with 10-50k Pseudomonas that was sensitive.   - Given ceftriaxone  in ED (MID <0.25 on last culture), will change to cefepime  given Pseudomonas and her immunosuppressed status.  - Monitor current urine culture and blood cultures, tailor therapy as appropriate.  Covid-19 infection: With negative CXR, no hypoxia or respiratory symptoms.  - Supportive care (no current symptoms) - Note paxlovid is contraindicated  in patient on cyclosporine .  - Airborne isolation per protocol (pt currently in ED hallway, so we're pursuing expedited bed/room assignment)  History of NICM, cardiac transplant, SSS s/p dual chamber pacemaker (last generator change 04/23/2024), HTN, HLD: ECG here congruent with her history, low suspicion for acute ischemic changes.  - Continue ASA, coreg  and follow up with [facility] per routine.  - Continue antirejection medications (likely cause of macrocytosis without anemia, will continue folic acid  as well). Dispense history suggests adherence to cyclosporine  50 mg BID, mycophenolate  500 mg BID. Will also continue valacyclovir  and chronic prednisone  5mg .  - Continue losartan  25mg . Hold lasix 20mg  for today, reassess in AM.  - Continue zetia.   Urinary incontinence:  - Dispense history suggests adherence to both mirabegron  25 mg daily and solifenacin. Given her presentation with AMS, if persistent, hold solifenacin  Stage IIIb CKD: SCr elevated with unclear baseline (last value was 2.1, currently 2.2).  - Minimize nephrotoxins as able.   Gout: Quiescent. - Allopurinol   Advance Care Planning: Full code  Consults: None  Family Communication: None at bedside.  Severity of Illness: The appropriate patient status for this patient is OBSERVATION. Observation status is judged to be reasonable and necessary in order to provide the required intensity of service to ensure the patient's safety. The  patient's presenting symptoms, physical exam findings, and initial radiographic and laboratory data in the context of their medical condition is felt to place them at decreased risk for further clinical deterioration. Furthermore, it is anticipated that the patient will be medically stable for discharge from the hospital within 2 midnights of admission.   Author: Bernardino KATHEE Come, MD 09/22/2024 12:32 AM  For on call review [email or web address].      [1]  Allergies Allergen Reactions   Oxybutynin Shortness Of Breath and Anaphylaxis   Sulfa Antibiotics Rash and Anaphylaxis   Bacitracin Swelling   Colchicine Diarrhea   Lisinopril Cough   Neomycin Swelling    Other Reaction(s): allergy testing   Sulfabenzamide Rash   "

## 2024-09-22 NOTE — ED Notes (Signed)
 Pt provided with discharge and follow up instructions, medications discussed, pt verbalized understanding. LDA removed, VSS, pt ambulatory out of ED w/ all paperwork and belongings in NAD.

## 2024-09-22 NOTE — ED Provider Notes (Addendum)
 " [facility]PT [facility] Emergency Department Provider Note MRN:  [id number]  Arrival date & time: 09/22/2024     Chief Complaint   Altered Mental Status   History of Present Illness   Cassie Harvey is a 78 y.o. year-old female presents to the ED with chief complaint of AMS.  Reports having increased confusion that started today.  Had been being treated for UTI, but finished unknown abx yesterday.  States that today she has been incontinent of urine.  Has had fever to 101.  Denies cough.  History provided by patient.   Review of Systems  Pertinent positive and negative review of systems noted in HPI.    Physical Exam   Vitals:   09/21/24 2201  BP: (!) 144/79  Pulse: 94  Resp: 18  Temp: (!) 101.7 F (38.7 C)  SpO2: 96%    CONSTITUTIONAL:  elderly-appearing, NAD NEURO:  Alert and oriented x 3, CN 3-12 grossly intact EYES:  eyes equal and reactive ENT/NECK:  Supple, no stridor  CARDIO:  normal rate, regular rhythm, appears well-perfused  PULM:  No respiratory distress, CTAB GI/GU:  non-distended,  MSK/SPINE:  No gross deformities, no edema, moves all extremities  SKIN:  no rash, atraumatic   *Additional and/or pertinent findings included in MDM below  Diagnostic and Interventional Summary    EKG Interpretation Date/Time:  Sunday September 21 2024 22:06:16 EST Ventricular Rate:  94 PR Interval:  128 QRS Duration:  118 QT Interval:  354 QTC Calculation: 442 R Axis:   -35  Text Interpretation: Sinus rhythm with Premature supraventricular complexes Possible Left atrial enlargement Left axis deviation Incomplete right bundle branch block ST & T wave abnormality, consider anterior ischemia Abnormal ECG When compared with ECG of 04-Jul-2002 09:01, PREVIOUS ECG IS PRESENT Changes noted from prior EKG Confirmed by Bari Pfeiffer ([id number]) on 09/22/2024 12:12:38 AM       Labs Reviewed  RESP PANEL BY RT-PCR (RSV, FLU A&B, COVID)  RVPGX2 - Abnormal; Notable  for the following components:      Result Value   SARS Coronavirus 2 by RT PCR POSITIVE (*)    All other components within normal limits  COMPREHENSIVE METABOLIC PANEL WITH GFR - Abnormal; Notable for the following components:   CO2 21 (*)    Glucose, Bld 116 (*)    BUN 45 (*)    Creatinine, Ser 2.22 (*)    Calcium 10.6 (*)    GFR, Estimated 22 (*)    Anion gap 17 (*)    All other components within normal limits  CBC WITH DIFFERENTIAL/PLATELET - Abnormal; Notable for the following components:   RBC 3.33 (*)    MCV 109.9 (*)    MCH 36.3 (*)    Neutro Abs 8.5 (*)    Monocytes Absolute 1.2 (*)    All other components within normal limits  URINALYSIS, W/ REFLEX TO CULTURE (INFECTION SUSPECTED) - Abnormal; Notable for the following components:   APPearance HAZY (*)    Protein, ur 30 (*)    Nitrite POSITIVE (*)    Leukocytes,Ua LARGE (*)    Bacteria, UA MANY (*)    All other components within normal limits  CBG MONITORING, ED - Abnormal; Notable for the following components:   Glucose-Capillary 119 (*)    All other components within normal limits  CULTURE, BLOOD (ROUTINE X 2)  CULTURE, BLOOD (ROUTINE X 2)  URINE CULTURE  PROTIME-INR  I-STAT CG4 LACTIC ACID, ED  I-STAT CG4 LACTIC ACID, ED  DG Chest Port 1 View  Final Result      Medications  cefTRIAXone  (ROCEPHIN ) 1 g in sodium chloride  0.9 % 100 mL IVPB (has no administration in time range)  acetaminophen  (TYLENOL ) tablet 1,000 mg (1,000 mg Oral Given 09/21/24 2241)     Procedures  /  Critical Care .Critical Care  Performed by: Vicky Charleston, PA-C Authorized by: Vicky Charleston, PA-C   Critical care provider statement:    Critical care time (minutes):  41   Critical care was necessary to treat or prevent imminent or life-threatening deterioration of the following conditions:  Sepsis   Critical care was time spent personally by me on the following activities:  Development of treatment plan with patient or  surrogate, discussions with consultants, evaluation of patient's response to treatment, examination of patient, ordering and review of laboratory studies, ordering and review of radiographic studies, ordering and performing treatments and interventions, pulse oximetry, re-evaluation of patient's condition and review of old charts   ED Course and Medical Decision Making  I have reviewed the triage vital signs, the nursing notes, and pertinent available records from the EMR.  Social Determinants Affecting Complexity of Care: Patient has no clinically significant social determinants affecting this chief complaint..   ED Course:    Medical Decision Making Patient here with fever, confusion at home, recovering from UTI.  Has had urinary incontinence today, but reportedly finished abx yesterday.  UA still worrisome for infection.  Also COVID positive.  Will start rocephin .  Defer paxlovid to inpatient team if indicated.  Normal lactic.  Not hypotensive.  Will consult hospitalist for admission.  Amount and/or Complexity of Data Reviewed Labs: ordered. Radiology: ordered.  Risk OTC drugs. Decision regarding hospitalization.         Consultants: I consulted with Hospitalist, Dr. Bryn, who is appreciated for admitting.   Treatment and Plan: Patient's exam and diagnostic results are concerning for sepsis.  Feel that patient will need admission to the hospital for further treatment and evaluation.    Final Clinical Impressions(s) / ED Diagnoses     ICD-10-CM   1. COVID-19  U07.1     2. Acute cystitis without hematuria  N30.00       ED Discharge Orders     None         Discharge Instructions Discussed with and Provided to Patient:   Discharge Instructions   None      Vicky Charleston, PA-C 09/22/24 0032    Vicky Charleston, PA-C 09/22/24 0034    Bari Charmaine FALCON, MD 09/22/24 [phone]  "

## 2024-09-22 NOTE — Progress Notes (Addendum)
 Patient wanting to go home,  improved overnight but still getting IV abx for possible UTI.  Will see if she qualifies for the hospital at home program if not sensitive to cephalosporins so I would recommend keflex  250mg  q12h (adjusted for renal function)  Harlene Bowl DO

## 2024-09-22 NOTE — Discharge Summary (Signed)
 "     Physician Discharge Summary  Cassie Harvey FMW:[id number] DOB: [birth year] DOA: 09/21/2024  PCP: Cassie Nanci Baptise, MD  Admit date: 09/21/2024 Discharge date: 09/22/2024  Admitted From:  Discharge disposition: home   Recommendations for Outpatient Follow-Up:   Follow urine culture to final BMP 1 week   Discharge Diagnosis:   Principal Problem:   Acute metabolic encephalopathy Active Problems:   Cardiac pacemaker in situ   SSS (sick sinus syndrome) (HCC)   Status post heart transplantation (HCC)   Recurrent UTI    Discharge Condition: Improved.  Diet recommendation:  Regular.  Wound care: None.  Code status: Full.   History of Present Illness:   Cassie Harvey is a 78 y.o. female with NICM s/p heart transplant 2004, SSS s/p PPM, HTN, OAB who is presenting with acute confusion and weakness. Per caregiver, patient developed difficulty standing and intermittent nonsensical speech earlier today. Since arrival, she has been intermittently confused, able to answer questions appropriately at times but not consistently at baseline.   Patient seen in hallway. She knows day, date, time, situation, birthday. Completed the prescribed course of augmentin from urgent care over the past week, but still with increased urinary incontinence from baseline and burning with urination. Has also had fevers off and on for a few days.     In the ED she was febrile to 101.54F, HR 94, BP 144/79. Breathing normally without hypoxia on room air. SARS-CoV-2 PCR was positive, CXR clear. Urinalysis consistent with UTI for which ceftriaxone  was given.WBC 10.5k with neutrophilic predominance (PMNs 8.5k). She remains hemodynamically stable without hypotension or tachycardia; lactate is normal. Admission requested for altered mental status.    Hospital Course by Problem:   Acute metabolic encephalopathy: Due to UTI and covid-19 infection. Increased susceptibility for AMS w/infection with CT head  June 2025 showing extensive microvascular disease and generalized atrophy.  No focal deficits on today's exam.  - -resolved, patient wanting to go home -no PT follow up   UTI: Nitrite-positive pyuria despite reportedly appropriate course of augmentin started 12/21. Also with neutrophilia and fever. HR also >90. UCx 02/13/2024 w/E. coli resistant to amp, amp/sulbactam, cipro, and pip/tazo (suspect this and her immunosuppressed status is continued resistance pattern explaining failure w/augmentin). Urine culture from UC visit 12/21 with 10-50k Pseudomonas that was sensitive.   -cephalosporins so I would recommend keflex  250mg  q12h    Covid-19 infection: With negative CXR, no hypoxia or respiratory symptoms.  - Supportive care (no current symptoms) - Note paxlovid is contraindicated in patient on cyclosporine .    History of NICM, cardiac transplant, SSS s/p dual chamber pacemaker (last generator change 04/23/2024), HTN, HLD: ECG here congruent with her history, low suspicion for acute ischemic changes.  - Continue ASA, coreg  and follow up with [facility] per routine.  - Continue antirejection medications (likely cause of macrocytosis without anemia, will continue folic acid  as well). Dispense history suggests adherence to cyclosporine  50 mg BID, mycophenolate  500 mg BID. Will also continue valacyclovir  and chronic prednisone  5mg .     Urinary incontinence:  - resume home med   Stage IIIb CKD: SCr elevated with unclear baseline (last value was 2.1, currently 2.2).  - Minimize nephrotoxins as able.    Gout: Quiescent. - Allopurinol     Medical Consultants:      Discharge Exam:   Vitals:   09/22/24 1115 09/22/24 1455  BP: 112/70 125/62  Pulse: 62 73  Resp: 15 17  Temp: 98.7 F (37.1 C) 98.5  F (36.9 C)  SpO2: 98% 97%   Vitals:   09/22/24 0750 09/22/24 1000 09/22/24 1115 09/22/24 1455  BP:   112/70 125/62  Pulse: 77  62 73  Resp:   15 17  Temp:   98.7 F (37.1 C) 98.5 F (36.9 C)   TempSrc:   Oral Oral  SpO2: 95%  98% 97%  Weight:  52.2 kg    Height:  5' 5 (1.651 m)      General exam: Appears calm and comfortable.    The results of significant diagnostics from this hospitalization (including imaging, microbiology, ancillary and laboratory) are listed below for reference.     Procedures and Diagnostic Studies:   DG Chest Port 1 View Result Date: 09/22/2024 EXAM: 1 VIEW(S) XRAY OF THE CHEST 09/21/2024 11:49:03 PM COMPARISON: 03/15/2024 CLINICAL HISTORY: Questionable sepsis - evaluate for abnormality FINDINGS: LUNGS AND PLEURA: No focal pulmonary opacity. No pleural effusion. No pneumothorax. HEART AND MEDIASTINUM: Mediastinal clips noted. Atherosclerotic calcifications. Left chest cardiac pacing device in place. BONES AND SOFT TISSUES: Sternotomy wires noted. IMPRESSION: 1. No acute cardiopulmonary findings. Electronically signed by: Greig Pique MD 09/22/2024 12:20 AM EST RP Workstation: [id number]     Labs:   Basic Metabolic Panel: Recent Labs  Lab 09/21/24 2237 09/22/24 0409  NA 139 138  K 4.4 4.1  CL 101 103  CO2 21* 20*  GLUCOSE 116* 104*  BUN 45* 48*  CREATININE 2.22* 2.30*  CALCIUM 10.6* 9.7   GFR Estimated Creatinine Clearance: 16.9 mL/min (A) (by C-G formula based on SCr of 2.3 mg/dL (H)). Liver Function Tests: Recent Labs  Lab 09/21/24 2237  AST 30  ALT 19  ALKPHOS 43  BILITOT 0.4  PROT 7.3  ALBUMIN 4.4   No results for input(s): LIPASE, AMYLASE in the last 168 hours. No results for input(s): AMMONIA in the last 168 hours. Coagulation profile Recent Labs  Lab 09/21/24 2237  INR 1.0    CBC: Recent Labs  Lab 09/21/24 2237 09/22/24 0409  WBC 10.5 9.1  NEUTROABS 8.5*  --   HGB 12.1 10.5*  HCT 36.6 31.6*  MCV 109.9* 110.1*  PLT 308 254   Cardiac Enzymes: No results for input(s): CKTOTAL, CKMB, CKMBINDEX, TROPONINI in the last 168 hours. BNP: Invalid input(s): POCBNP CBG: Recent Labs  Lab  09/21/24 2202  GLUCAP 119*   D-Dimer No results for input(s): DDIMER in the last 72 hours. Hgb A1c No results for input(s): HGBA1C in the last 72 hours. Lipid Profile No results for input(s): CHOL, HDL, LDLCALC, TRIG, CHOLHDL, LDLDIRECT in the last 72 hours. Thyroid  function studies No results for input(s): TSH, T4TOTAL, T3FREE, THYROIDAB in the last 72 hours.  Invalid input(s): FREET3 Anemia work up No results for input(s): VITAMINB12, FOLATE, FERRITIN, TIBC, IRON, RETICCTPCT in the last 72 hours. Microbiology Recent Results (from the past 240 hours)  Blood Culture (routine x 2)     Status: None (Preliminary result)   Collection Time: 09/21/24 10:37 PM   Specimen: BLOOD  Result Value Ref Range Status   Specimen Description BLOOD BLOOD LEFT ARM  Final   Special Requests   Final    BOTTLES DRAWN AEROBIC AND ANAEROBIC Blood Culture adequate volume   Culture   Final    NO GROWTH < 12 HOURS Performed at [facility], [address] [place], [place] [address]    Report Status PENDING  Incomplete  Blood Culture (routine x 2)     Status: None (Preliminary result)  Collection Time: 09/21/24 10:44 PM   Specimen: BLOOD  Result Value Ref Range Status   Specimen Description BLOOD BLOOD RIGHT HAND  Final   Special Requests   Final    BOTTLES DRAWN AEROBIC AND ANAEROBIC Blood Culture adequate volume   Culture   Final    NO GROWTH < 12 HOURS Performed at [facility], [address], [place], [place] [address]    Report Status PENDING  Incomplete  Resp panel by RT-PCR (RSV, Flu A&B, Covid) Anterior Nasal Swab     Status: Abnormal   Collection Time: 09/21/24 11:06 PM   Specimen: Anterior Nasal Swab  Result Value Ref Range Status   SARS Coronavirus 2 by RT PCR POSITIVE (A) NEGATIVE Final   Influenza A by PCR NEGATIVE NEGATIVE Final   Influenza B by PCR NEGATIVE NEGATIVE Final    Comment: (NOTE) The Xpert Xpress SARS-CoV-2/FLU/RSV  plus assay is intended as an aid in the diagnosis of influenza from Nasopharyngeal swab specimens and should not be used as a sole basis for treatment. Nasal washings and aspirates are unacceptable for Xpert Xpress SARS-CoV-2/FLU/RSV testing.  Fact Sheet for Patients: bloggercourse.com  Fact Sheet for Healthcare Providers: seriousbroker.it  This test is not yet approved or cleared by the [place]  FDA and has been authorized for detection and/or diagnosis of SARS-CoV-2 by FDA under an Emergency Use Authorization (EUA). This EUA will remain in effect (meaning this test can be used) for the duration of the COVID-19 declaration under Section 564(b)(1) of the Act, 21 U.S.C. section 360bbb-3(b)(1), unless the authorization is terminated or revoked.     Resp Syncytial Virus by PCR NEGATIVE NEGATIVE Final    Comment: (NOTE) Fact Sheet for Patients: bloggercourse.com  Fact Sheet for Healthcare Providers: seriousbroker.it  This test is not yet approved or cleared by the [place]  FDA and has been authorized for detection and/or diagnosis of SARS-CoV-2 by FDA under an Emergency Use Authorization (EUA). This EUA will remain in effect (meaning this test can be used) for the duration of the COVID-19 declaration under Section 564(b)(1) of the Act, 21 U.S.C. section 360bbb-3(b)(1), unless the authorization is terminated or revoked.  Performed at [facility], [address], [place], [place] [address]      Discharge Instructions:   Discharge Instructions     Diet - low sodium heart healthy   Complete by: As directed    Discharge instructions   Complete by: As directed    You were briefly admitted for concern of altered mental status. This has rapidly improved with an antibiotic, ceftriaxone . Your urine culture is still pending, but based on a prior culture it is not  surprising that you failed augmentin. Also based on that culture, you should take keflex  250mg  twice a day for 7 total day Remain in isolation for covid-19 per CDC guidelines.   If your symptoms return, seek medical attention right away.   Increase activity slowly   Complete by: As directed       Allergies as of 09/22/2024       Reactions   Oxybutynin Anaphylaxis, Shortness Of Breath   Sulfa Antibiotics Anaphylaxis, Rash   Bacitracin Swelling   Colchicine Diarrhea   Lisinopril Cough   Neomycin Swelling   Other Reaction(s): allergy testing   Sulfabenzamide Rash        Medication List     TAKE these medications    acetaminophen  500 MG tablet Commonly known as: TYLENOL  Take 500-1,000 mg by  mouth every 6 (six) hours as needed for moderate pain.   allopurinol  300 MG tablet Commonly known as: ZYLOPRIM  Take 300 mg by mouth daily.   ascorbic acid 500 MG tablet Commonly known as: VITAMIN C Take 500 mg by mouth daily.   vitamin C 1000 MG tablet Take 1,000 mg by mouth daily.   aspirin  EC 81 MG tablet Take 81 mg by mouth every evening.   carvedilol  3.125 MG tablet Commonly known as: COREG  Take 3.125 mg by mouth 2 (two) times daily with a meal.   cephALEXin  250 MG capsule Commonly known as: KEFLEX  Take 1 capsule (250 mg total) by mouth every 12 (twelve) hours.   clobetasol 0.05 % external solution Commonly known as: TEMOVATE Apply 1 Application topically 2 (two) times daily as needed (skin irritation).   Cranberry-Vitamin C 140-100 MG Caps Take 1 capsule by mouth 2 (two) times daily.   cycloSPORINE  modified 25 MG capsule Commonly known as: NEORAL  Take 50 mg by mouth at bedtime.   cycloSPORINE  modified 25 MG capsule Commonly known as: NEORAL  Take 75 mg by mouth in the morning.   DULoxetine  30 MG capsule Commonly known as: CYMBALTA  TAKE 1 CAPSULE(30 MG) BY MOUTH AT BEDTIME   ezetimibe 10 MG tablet Commonly known as: ZETIA Take 10 mg by mouth every  evening.   Fish Oil 1000 MG Caps Take 1,000 mg by mouth 3 (three) times daily.   fluorouracil 5 % cream Commonly known as: EFUDEX Apply 1 Application topically daily as needed (precancerous spots).   folic acid  1 MG tablet Commonly known as: FOLVITE  Take 1 mg by mouth daily.   furosemide 20 MG tablet Commonly known as: LASIX Take 20 mg by mouth in the morning.   GNP Calcium Plus 600 +D 600-200 MG-UNIT Tabs Take 1 tablet by mouth daily.   ketoconazole 2 % shampoo Commonly known as: NIZORAL Apply 1 Application topically 2 (two) times a week.   losartan  25 MG tablet Commonly known as: COZAAR  Take 25 mg by mouth in the morning.   multivitamin with minerals Tabs tablet Take 1 tablet by mouth daily.   mycophenolate  500 MG tablet Commonly known as: CELLCEPT  Take 500 mg by mouth 2 (two) times daily.   Myrbetriq  25 MG Tb24 tablet Generic drug: mirabegron  ER Take 25 mg by mouth at bedtime.   niacin  500 MG tablet Commonly known as: VITAMIN B3 Take 500 mg by mouth every evening. Take with 250 mg to equal 750 mg daily   niacin  250 MG tablet Commonly known as: VITAMIN B3 Take 250 mg by mouth every evening. Take with 500 mg to equal 750 mg daily   NICOTINAMIDE PO Take 1 tablet by mouth 2 (two) times daily.   ondansetron  8 MG disintegrating tablet Commonly known as: ZOFRAN -ODT Take 8 mg by mouth every 8 (eight) hours as needed for vomiting or nausea.   OVER THE COUNTER MEDICATION Take 1 capsule by mouth daily. metagenics-ultraflora IB 60BN CFu   predniSONE  5 MG tablet Commonly known as: DELTASONE  Take 5 mg by mouth daily.   solifenacin 10 MG tablet Commonly known as: VESICARE Take 10 mg by mouth every evening.   SUPER B COMPLEX PO Take 1 tablet by mouth every evening.   valACYclovir  500 MG tablet Commonly known as: VALTREX  Take 500 mg by mouth daily with lunch.   Vitamin D  50 MCG (2000 UT) tablet Take 2,000-4,000 Units by mouth See admin instructions. Take  4000 units in the morning and 2000 units  in the evening   Vitamin K2 100 MCG Tabs Take 100 mcg by mouth daily.        Follow-up Information     Cassie Nanci Baptise, MD Follow up.   Specialty: Cardiology Contact information: [address] [facility] [address] [facility] [place] [address] [place] [address] [phone]                  Time coordinating discharge: 45 min  Signed:  Harlene RAYMOND Bowl DO  [facility] 09/22/2024, 3:01 PM      "

## 2024-09-22 NOTE — ED Notes (Signed)
 Pt took home dose of NEORAL  per MD verbal order

## 2024-09-22 NOTE — Evaluation (Signed)
 Occupational Therapy Evaluation Patient Details Name: Cassie Harvey MRN: [id number] DOB: 09/23/1946 Today's Date: 09/22/2024   History of Present Illness   78 y.o. female with NICM s/p heart transplant 2004, PPM, HTN, OAB who is presenting with acute confusion and weakness. UTI+, Covid+.     Clinical Impressions Patient admitted for the diagnosis above.  PTA she lives alone at home, but has strong support from friends and family.  Currently she is up and moving at a distant supervision level, and is very close to her baseline.  OT will follow in the acute setting to encourage movement, but no post acute OT is anticipated given prior level of support.  PT Consult is pending.       If plan is discharge home, recommend the following:   Assist for transportation;Assistance with cooking/housework     Functional Status Assessment   Patient has had a recent decline in their functional status and demonstrates the ability to make significant improvements in function in a reasonable and predictable amount of time.     Equipment Recommendations   None recommended by OT     Recommendations for Other Services         Precautions/Restrictions   Precautions Precautions: Fall Recall of Precautions/Restrictions: Intact Restrictions Weight Bearing Restrictions Per Provider Order: No     Mobility Bed Mobility Overal bed mobility: Independent                  Transfers Overall transfer level: Needs assistance Equipment used: Rolling walker (2 wheels) Transfers: Sit to/from Stand Sit to Stand: Modified independent (Device/Increase time)                  Balance Overall balance assessment: Needs assistance Sitting-balance support: Feet unsupported Sitting balance-Leahy Scale: Good     Standing balance support: Reliant on assistive device for balance Standing balance-Leahy Scale: Fair                             ADL either performed or  assessed with clinical judgement   ADL       Grooming: Supervision/safety               Lower Body Dressing: Supervision/safety   Toilet Transfer: Supervision/safety;Regular Toilet;Rolling walker (2 wheels);Ambulation             General ADL Comments: No LOB and good safety     Vision Baseline Vision/History: 1 Wears glasses Patient Visual Report: No change from baseline       Perception Perception: Not tested       Praxis Praxis: Not tested       Pertinent Vitals/Pain Pain Assessment Pain Assessment: No/denies pain     Extremity/Trunk Assessment Upper Extremity Assessment Upper Extremity Assessment: Overall WFL for tasks assessed   Lower Extremity Assessment Lower Extremity Assessment: Defer to PT evaluation   Cervical / Trunk Assessment Cervical / Trunk Assessment: Normal   Communication Communication Communication: No apparent difficulties Factors Affecting Communication: Hearing impaired   Cognition Arousal: Alert Behavior During Therapy: WFL for tasks assessed/performed Cognition: No apparent impairments                               Following commands: Intact       Cueing  General Comments   Cueing Techniques: Verbal cues   VSS on RA   Exercises     Shoulder  Instructions      Home Living Family/patient expects to be discharged to:: Private residence Living Arrangements: Alone Available Help at Discharge: Friend(s);Available PRN/intermittently Type of Home: House Home Access: Stairs to enter [employer] of Steps: 3 Entrance Stairs-Rails: Right;Left Home Layout: One level     Bathroom Shower/Tub: Producer, Television/film/video: Handicapped height Bathroom Accessibility: Yes How Accessible: Accessible via walker Home Equipment: Cane - single point;BSC/3in1;Shower seat;Transport Restaurant Manager, Fast Food (2 wheels)          Prior Functioning/Environment Prior Level of Function : Needs assist                ADLs Comments: Friend stops by 3x/day to assist with iADL, community mobiltiy and meals.  Patient reports RW use in the home and Mod I with ADL.    OT Problem List: Impaired balance (sitting and/or standing)   OT Treatment/Interventions: Self-care/ADL training;Therapeutic activities;Patient/family education;Balance training;DME and/or AE instruction      OT Goals(Current goals can be found in the care plan section)   Acute Rehab OT Goals Patient Stated Goal: Return home OT Goal Formulation: With patient Time For Goal Achievement: 10/06/24 Potential to Achieve Goals: Good ADL Goals Pt Will Perform Grooming: with modified independence;standing Pt Will Perform Lower Body Dressing: Independently;sit to/from stand Pt Will Transfer to Toilet: with modified independence;regular height toilet;ambulating   OT Frequency:  Min 2X/week    Co-evaluation              AM-PAC OT 6 Clicks Daily Activity     Outcome Measure Help from another person eating meals?: None Help from another person taking care of personal grooming?: None Help from another person toileting, which includes using toliet, bedpan, or urinal?: A Little Help from another person bathing (including washing, rinsing, drying)?: A Little Help from another person to put on and taking off regular upper body clothing?: None Help from another person to put on and taking off regular lower body clothing?: A Little 6 Click Score: 21   End of Session Equipment Utilized During Treatment: Rolling walker (2 wheels);Gait belt Nurse Communication: Mobility status  Activity Tolerance: Patient tolerated treatment well Patient left: in bed;with call bell/phone within reach  OT Visit Diagnosis: Unsteadiness on feet (R26.81)                Time: 1200-1220 OT Time Calculation (min): 20 min Charges:  OT General Charges $OT Visit: 1 Visit OT Evaluation $OT Eval Moderate Complexity: 1 Mod  09/22/2024  RP,  OTR/L  Acute Rehabilitation Services  Office:  [phone]   Cassie Harvey 09/22/2024, 12:39 PM

## 2024-09-23 LAB — URINE CULTURE: Culture: >=100000 — AB

## 2024-09-24 ENCOUNTER — Telehealth (HOSPITAL_BASED_OUTPATIENT_CLINIC_OR_DEPARTMENT_OTHER): Payer: Self-pay | Admitting: *Deleted

## 2024-09-24 NOTE — Telephone Encounter (Signed)
 Post ED Visit - Positive Culture Follow-up: Successful Patient Follow-Up  Culture assessed and recommendations reviewed by:  []  Rankin Dee, Pharm.D. []  Venetia Gully, Pharm.D., BCPS AQ-ID []  Garrel Crews, Pharm.D., BCPS []  Almarie Lunger, Pharm.D., BCPS []  [place], [place].D., BCPS, AAHIVP []  Rosaline Bihari, Pharm.D., BCPS, AAHIVP []  Vernell Meier, PharmD, BCPS []  Latanya Hint, PharmD, BCPS []  Donald Medley, PharmD, BCPS []  Rocky Bold, PharmD  Positive urine culture reviewed by Nivia Colon PA  []  Patient discharged without antimicrobial prescription and treatment is now indicated [x]  Organism is resistant to prescribed ED discharge antimicrobial []  Patient with positive blood cultures  Changes discussed with ED provider: Nivia Colon PA New antibiotic prescription  Ciprofloxacin 250mg  Q 24 hrs X 3 days Called to [place] at [phone]  Contacted patient, date 09/24/24, time 1257   Jama Wyman Kipper 09/24/2024, 12:55 PM

## 2024-09-26 LAB — CULTURE, BLOOD (ROUTINE X 2)
Culture: NO GROWTH
Culture: NO GROWTH
Special Requests: ADEQUATE
Special Requests: ADEQUATE

## 2024-10-03 ENCOUNTER — Emergency Department (HOSPITAL_COMMUNITY)

## 2024-10-03 ENCOUNTER — Other Ambulatory Visit: Payer: Self-pay

## 2024-10-03 ENCOUNTER — Emergency Department (HOSPITAL_COMMUNITY)
Admission: EM | Admit: 2024-10-03 | Discharge: 2024-10-03 | Disposition: A | Discharge status: Home / Self Care | Attending: Emergency Medicine | Admitting: Emergency Medicine

## 2024-10-03 DIAGNOSIS — R197 Diarrhea, unspecified: Secondary | ICD-10-CM

## 2024-10-03 DIAGNOSIS — U071 COVID-19: Secondary | ICD-10-CM | POA: Insufficient documentation

## 2024-10-03 DIAGNOSIS — Z950 Presence of cardiac pacemaker: Secondary | ICD-10-CM | POA: Diagnosis not present

## 2024-10-03 DIAGNOSIS — Z7982 Long term (current) use of aspirin: Secondary | ICD-10-CM | POA: Insufficient documentation

## 2024-10-03 DIAGNOSIS — D72829 Elevated white blood cell count, unspecified: Secondary | ICD-10-CM | POA: Insufficient documentation

## 2024-10-03 DIAGNOSIS — K529 Noninfective gastroenteritis and colitis, unspecified: Secondary | ICD-10-CM | POA: Insufficient documentation

## 2024-10-03 DIAGNOSIS — I10 Essential (primary) hypertension: Secondary | ICD-10-CM | POA: Insufficient documentation

## 2024-10-03 DIAGNOSIS — Z79899 Other long term (current) drug therapy: Secondary | ICD-10-CM | POA: Diagnosis not present

## 2024-10-03 DIAGNOSIS — K8000 Calculus of gallbladder with acute cholecystitis without obstruction: Secondary | ICD-10-CM | POA: Diagnosis not present

## 2024-10-03 LAB — CBC WITH DIFFERENTIAL/PLATELET
Abs Immature Granulocytes: 0.11 K/uL — ABNORMAL HIGH (ref 0.00–0.07)
Basophils Absolute: 0 K/uL (ref 0.0–0.1)
Basophils Relative: 0 %
Eosinophils Absolute: 0.1 K/uL (ref 0.0–0.5)
Eosinophils Relative: 1 %
HCT: 34.5 % — ABNORMAL LOW (ref 36.0–46.0)
Hemoglobin: 10.9 g/dL — ABNORMAL LOW (ref 12.0–15.0)
Immature Granulocytes: 1 %
Lymphocytes Relative: 9 %
Lymphs Abs: 1.3 K/uL (ref 0.7–4.0)
MCH: 35.6 pg — ABNORMAL HIGH (ref 26.0–34.0)
MCHC: 31.6 g/dL (ref 30.0–36.0)
MCV: 112.7 fL — ABNORMAL HIGH (ref 80.0–100.0)
Monocytes Absolute: 0.9 K/uL (ref 0.1–1.0)
Monocytes Relative: 7 %
Neutro Abs: 11 K/uL — ABNORMAL HIGH (ref 1.7–7.7)
Neutrophils Relative %: 82 %
Platelets: 432 K/uL — ABNORMAL HIGH (ref 150–400)
RBC: 3.06 MIL/uL — ABNORMAL LOW (ref 3.87–5.11)
RDW: 13.8 % (ref 11.5–15.5)
Smear Review: NORMAL
WBC: 13.5 K/uL — ABNORMAL HIGH (ref 4.0–10.5)
nRBC: 0 % (ref 0.0–0.2)

## 2024-10-03 LAB — TROPONIN T, HIGH SENSITIVITY
Troponin T High Sensitivity: 37 ng/L — ABNORMAL HIGH (ref 0–19)
Troponin T High Sensitivity: 35 ng/L — ABNORMAL HIGH (ref 0–19)

## 2024-10-03 LAB — COMPREHENSIVE METABOLIC PANEL WITH GFR
ALT: 14 U/L (ref 0–44)
AST: 24 U/L (ref 15–41)
Albumin: 3.4 g/dL — ABNORMAL LOW (ref 3.5–5.0)
Alkaline Phosphatase: 67 U/L (ref 38–126)
Anion gap: 15 (ref 5–15)
BUN: 34 mg/dL — ABNORMAL HIGH (ref 8–23)
CO2: 16 mmol/L — ABNORMAL LOW (ref 22–32)
Calcium: 7.3 mg/dL — ABNORMAL LOW (ref 8.9–10.3)
Chloride: 108 mmol/L (ref 98–111)
Creatinine, Ser: 1.64 mg/dL — ABNORMAL HIGH (ref 0.44–1.00)
GFR, Estimated: 32 mL/min — ABNORMAL LOW
Glucose, Bld: 87 mg/dL (ref 70–99)
Potassium: 4.6 mmol/L (ref 3.5–5.1)
Sodium: 138 mmol/L (ref 135–145)
Total Bilirubin: 0.4 mg/dL (ref 0.0–1.2)
Total Protein: 6.5 g/dL (ref 6.5–8.1)

## 2024-10-03 LAB — URINALYSIS, W/ REFLEX TO CULTURE (INFECTION SUSPECTED)
Bacteria, UA: NONE SEEN
Bilirubin Urine: NEGATIVE
Glucose, UA: NEGATIVE mg/dL
Hgb urine dipstick: NEGATIVE
Ketones, ur: NEGATIVE mg/dL
Nitrite: NEGATIVE
Protein, ur: NEGATIVE mg/dL
Specific Gravity, Urine: 1.012 (ref 1.005–1.030)
pH: 5 (ref 5.0–8.0)

## 2024-10-03 LAB — RESP PANEL BY RT-PCR (RSV, FLU A&B, COVID)  RVPGX2
Influenza A by PCR: NEGATIVE
Influenza B by PCR: NEGATIVE
Resp Syncytial Virus by PCR: NEGATIVE
SARS Coronavirus 2 by RT PCR: POSITIVE — AB

## 2024-10-03 LAB — LIPASE, BLOOD: Lipase: 46 U/L (ref 11–51)

## 2024-10-03 LAB — I-STAT CG4 LACTIC ACID, ED: Lactic Acid, Venous: 0.7 mmol/L (ref 0.5–1.9)

## 2024-10-03 MED ORDER — AMOXICILLIN-POT CLAVULANATE 875-125 MG PO TABS
1.0000 | ORAL_TABLET | Freq: Once | ORAL | Status: AC
  Administered 2024-10-03: 1 via ORAL
  Filled 2024-10-03: qty 1

## 2024-10-03 MED ORDER — IOHEXOL 300 MG/ML  SOLN: 80.0000 mL | Freq: Once | INTRAMUSCULAR | Status: DC | PRN

## 2024-10-03 MED ORDER — AMOXICILLIN-POT CLAVULANATE 875-125 MG PO TABS: 1.0000 | ORAL_TABLET | Freq: Two times a day (BID) | ORAL | 0 refills | Status: AC

## 2024-10-03 MED ORDER — ACETAMINOPHEN 325 MG PO TABS
650.0000 mg | ORAL_TABLET | Freq: Once | ORAL | Status: AC
  Administered 2024-10-03: 650 mg via ORAL
  Filled 2024-10-03: qty 2

## 2024-10-03 MED ORDER — LACTATED RINGERS IV BOLUS
1000.0000 mL | Freq: Once | INTRAVENOUS | Status: AC
  Administered 2024-10-03: 1000 mL via INTRAVENOUS

## 2024-10-03 NOTE — ED Notes (Signed)
 Writer attempted twice for IV access, but was unsuccessful.

## 2024-10-03 NOTE — ED Provider Triage Note (Addendum)
 Emergency Medicine Provider Triage Evaluation Note  JAMERIAH TROTTI , a 79 y.o. female  was evaluated in triage.  Pt complains of late December had COVID and UTI. Patient done with ABX, but has been having diarrhea, lack of appetite, and weakness over the past 5 days. Also with cough and rhinorrhea. Denies chest pain or SOB or abdominal pain.  Review of Systems  Positive:  Negative:   Physical Exam  There were no vitals taken for this visit. Gen:   Awake, no distress   Resp:  Normal effort  MSK:   Moves extremities without difficulty  Other:    Medical Decision Making  Medically screening exam initiated at 12:56 PM.  Appropriate orders placed.  Vincy Feliz Betzold was informed that the remainder of the evaluation will be completed by another provider, this initial triage assessment does not replace that evaluation, and the importance of remaining in the ED until their evaluation is complete.     Hoy Nidia FALCON, [place] 10/03/24 1301    Hoy Nidia FALCON, [place] 10/03/24 1303

## 2024-10-03 NOTE — Discharge Instructions (Addendum)
 Thank you letting us  evaluate you today.  Your labs are notable for a mild elevation in your white blood cell count which indicates mild infection, inflammation.  Your heart enzymes are negative.  Your CT of your abdomen showed gallstones and colitis (inflammation or infection of your colon/large intestine).  If you start to feel pain in your right upper abdomen especially after eating, you can follow-up with general surgery provider who as noted above.  I have given you a dose of antibiotic here in Emergency Department for colitis as well as a prescription.  Please pick this up tomorrow and start taking as prescribed.  You should not had any leftover. Also follow up with GI/belly doctor soon for revaluation and continued diarrhea  Return to Emergency Department if you experience intractable vomiting, severe dehydration, worsening abdominal pain, worsening symptoms

## 2024-10-03 NOTE — ED Triage Notes (Signed)
 Dx with Covid 12/28 , took abx for UTI & completed tx , pt now c/o diarrhea , loss of appetite, and weakness x 4-5 days.   Has a heart transplant.

## 2024-10-03 NOTE — ED Provider Notes (Signed)
 " [facility] Provider Note   CSN: [id number] Arrival date & time: 10/03/24  1245     Patient presents with: Diarrhea   Cassie Harvey is a 79 y.o. female with pmhx of NICM s/p heart transplant 2004, SSS s/p pacemaker, HTN, OAB presents to Emergency Department for evaluation of cough, sore throat, decreased appetite, diarrhea.  Patient is status post 1 day admission for COVID, UTI, sepsis from 09/13/2024 to 09/22/2024.  Was not provided Paxlovid as it is contraindicated as she is currently on cyclosporine .  She completed Keflex  for UTI on 09/30/23.  She reports that her cough has improved since being diagnosed with COVID but continues to have sore throat, decreased appetite, left ear pain since being diagnosed. Denies CP, SHOB, known fevers  Also complains of diarrhea that has been worsening over the past 5 days but has been intermittently occurring over the past year.  She reports over the past 5 days, she has been using depends as stool will leak out of her. Has been having 2 episodes of diarrhea daily. No complaints of back pain, saddle paresthesia.  Does have small amount of urinary incontinence at baseline from overactive bladder.  Denies abdominal pain, blood in stool or melena. Patient appears to have a history of chronic diarrhea and occasional fecal soiling as noted on gastroenterology note in 2024.  She then underwent colonoscopy which showed noninfective gastroenteritis, colitis, angiodysplasia of colon without hemorrhage.  Pathology was sent for biopsy which showed benign colonic mucosa with no signs of neoplasm.  She reports that she has had 2 stool test for bacteria by her gastroenterologist and urgent care that were negative for bacteria but unknown when these were performed.  She is currently following Dr. Kristie in gastroenterology. She states no one can figure out why I have diarrhea. No recent travel. Was on keflex  for UTI four days ago.  Denies fevers, chills   {Add pertinent medical, surgical, social history, OB history to [id number]}  Diarrhea      Prior to Admission medications  Medication Sig Start Date End Date Taking? Authorizing Provider  amoxicillin -clavulanate (AUGMENTIN ) 875-125 MG tablet Take 1 tablet by mouth every 12 (twelve) hours. 10/03/24  Yes Minnie Tinnie BRAVO, PA  acetaminophen  (TYLENOL ) 500 MG tablet Take 500-1,000 mg by mouth every 6 (six) hours as needed for moderate pain.    [provider]  allopurinol  (ZYLOPRIM ) 300 MG tablet Take 300 mg by mouth daily.    [provider]  Ascorbic Acid (VITAMIN C) 1000 MG tablet Take 1,000 mg by mouth daily.    [provider]  ascorbic acid (VITAMIN C) 500 MG tablet Take 500 mg by mouth daily.    [provider]  aspirin  EC 81 MG tablet Take 81 mg by mouth every evening.    [provider]  B Complex-C (SUPER B COMPLEX PO) Take 1 tablet by mouth every evening.    [provider]  Calcium Carbonate-Vit D-Min (GNP CALCIUM PLUS 600 +D) 600-200 MG-UNIT TABS Take 1 tablet by mouth daily.    [provider]  carvedilol  (COREG ) 3.125 MG tablet Take 3.125 mg by mouth 2 (two) times daily with a meal.    [provider]  Cholecalciferol (VITAMIN D ) 50 MCG (2000 UT) tablet Take 2,000-4,000 Units by mouth See admin instructions. Take 4000 units in the morning and 2000 units in the evening    [provider]  clobetasol (TEMOVATE) 0.05 % external solution  Apply 1 Application topically 2 (two) times daily as needed (skin irritation). 07/30/24   [provider]  Cranberry-Vitamin C 140-100 MG CAPS Take 1 capsule by mouth 2 (two) times daily.    [provider]  cycloSPORINE  modified (NEORAL ) 25 MG capsule Take 50 mg by mouth at bedtime.    [provider]  cycloSPORINE  modified (NEORAL ) 25 MG capsule Take 75 mg by mouth in the morning.    [provider]  DULoxetine   (CYMBALTA ) 30 MG capsule TAKE 1 CAPSULE(30 MG) BY MOUTH AT BEDTIME Patient not taking: Reported on 09/22/2024 03/12/24   Gayland Lauraine PARAS, NP  ezetimibe  (ZETIA ) 10 MG tablet Take 10 mg by mouth every evening.    [provider]  fluorouracil (EFUDEX) 5 % cream Apply 1 Application topically daily as needed (precancerous spots).    [provider]  folic acid  (FOLVITE ) 1 MG tablet Take 1 mg by mouth daily. 09/23/17   [provider]  furosemide (LASIX) 20 MG tablet Take 20 mg by mouth in the morning.    [provider]  ketoconazole (NIZORAL) 2 % shampoo Apply 1 Application topically 2 (two) times a week. 08/18/24   [provider]  losartan  (COZAAR ) 25 MG tablet Take 25 mg by mouth in the morning.    [provider]  Menatetrenone (VITAMIN K2) 100 MCG TABS Take 100 mcg by mouth daily.    [provider]  Multiple Vitamin (MULTIVITAMIN WITH MINERALS) TABS tablet Take 1 tablet by mouth daily.    [provider]  mycophenolate  (CELLCEPT ) 500 MG tablet Take 500 mg by mouth 2 (two) times daily.    [provider]  MYRBETRIQ  25 MG TB24 tablet Take 25 mg by mouth at bedtime. 03/12/24   [provider]  niacin  (VITAMIN B3) 250 MG tablet Take 250 mg by mouth every evening. Take with 500 mg to equal 750 mg daily    [provider]  niacin  (VITAMIN B3) 500 MG tablet Take 500 mg by mouth every evening. Take with 250 mg to equal 750 mg daily    [provider]  Niacinamide-Zn-Cu-Methfo-Se-Cr (NICOTINAMIDE PO) Take 1 tablet by mouth 2 (two) times daily.    [provider]  Omega-3 Fatty Acids (FISH OIL) 1000 MG CAPS Take 1,000 mg by mouth 3 (three) times daily.    [provider]  ondansetron  (ZOFRAN -ODT) 8 MG disintegrating tablet Take 8 mg by mouth every 8 (eight) hours as needed for vomiting or nausea.    [provider]  OVER THE COUNTER MEDICATION Take 1 capsule by mouth daily.  metagenics-ultraflora IB 60BN CFu    [provider]  predniSONE  (DELTASONE ) 5 MG tablet Take 5 mg by mouth daily. 08/19/24   [provider]  solifenacin (VESICARE) 10 MG tablet Take 10 mg by mouth every evening.    [provider]  valACYclovir  (VALTREX ) 500 MG tablet Take 500 mg by mouth daily with lunch.    [provider]    Allergies: Oxybutynin, Sulfa antibiotics, Bacitracin, Colchicine, Lisinopril, Neomycin, and Sulfabenzamide    Review of Systems  Gastrointestinal:  Positive for diarrhea.    Updated Vital Signs BP 121/69 (BP Location: Left Arm)   Pulse 71   Temp 98.8 F (37.1 C) (Oral)   Resp 15   SpO2 98%   Physical Exam Vitals and nursing note reviewed.  Constitutional:      General: She is not in acute distress.    Appearance: Normal  appearance. She is not ill-appearing.  HENT:     Head: Normocephalic and atraumatic.     Right Ear: Tympanic membrane, ear canal and external ear normal.     Left Ear: Tympanic membrane, ear canal and external ear normal.     Mouth/Throat:     Mouth: Mucous membranes are moist.     Pharynx: No oropharyngeal exudate or posterior oropharyngeal erythema.     Comments: Uvula midline. No abscess, fluctuance, erythema noted in mouth Eyes:     General: No scleral icterus.       Right eye: No discharge.        Left eye: No discharge.     Conjunctiva/sclera: Conjunctivae normal.  Cardiovascular:     Rate and Rhythm: Normal rate.     Pulses: Normal pulses.  Pulmonary:     Effort: Pulmonary effort is normal. No respiratory distress.     Breath sounds: Normal breath sounds. No stridor. No wheezing or rhonchi.  Chest:     Chest wall: No tenderness.  Abdominal:     General: Bowel sounds are normal. There is no distension.     Palpations: Abdomen is soft. There is no mass.     Tenderness: There is no abdominal tenderness. There is no guarding or rebound.  Musculoskeletal:     Cervical back: Normal range  of motion and neck supple. No rigidity or tenderness.     Right lower leg: No edema.     Left lower leg: No edema.  Lymphadenopathy:     Cervical: No cervical adenopathy.  Skin:    General: Skin is warm.     Capillary Refill: Capillary refill takes less than 2 seconds.     Coloration: Skin is not jaundiced or pale.  Neurological:     Mental Status: She is alert and oriented to person, place, and time. Mental status is at baseline.     (all labs ordered are listed, but only abnormal results are displayed) Labs Reviewed  COMPREHENSIVE METABOLIC PANEL WITH GFR - Abnormal; Notable for the following components:      Result Value   CO2 16 (*)    BUN 34 (*)    Creatinine, Ser 1.64 (*)    Calcium 7.3 (*)    Albumin 3.4 (*)    GFR, Estimated 32 (*)    All other components within normal limits  CBC WITH DIFFERENTIAL/PLATELET - Abnormal; Notable for the following components:   WBC 13.5 (*)    RBC 3.06 (*)    Hemoglobin 10.9 (*)    HCT 34.5 (*)    MCV 112.7 (*)    MCH 35.6 (*)    Platelets 432 (*)    Neutro Abs 11.0 (*)    Abs Immature Granulocytes 0.11 (*)    All other components within normal limits  URINALYSIS, W/ REFLEX TO CULTURE (INFECTION SUSPECTED) - Abnormal; Notable for the following components:   Leukocytes,Ua TRACE (*)    All other components within normal limits  TROPONIN T, HIGH SENSITIVITY - Abnormal; Notable for the following components:   Troponin T High Sensitivity 37 (*)    All other components within normal limits  TROPONIN T, HIGH SENSITIVITY - Abnormal; Notable for the following components:   Troponin T High Sensitivity 35 (*)    All other components within normal limits  GASTROINTESTINAL PANEL BY PCR, STOOL (REPLACES STOOL CULTURE)  C DIFFICILE QUICK SCREEN W PCR REFLEX    RESP PANEL BY RT-PCR (RSV, FLU A&B, COVID)  RVPGX2  LIPASE, BLOOD  I-STAT CG4 LACTIC ACID, ED  I-STAT CG4 LACTIC ACID, ED    EKG: EKG Interpretation Date/Time:  Friday October 03 2024 13:25:10 EST Ventricular Rate:  78 PR Interval:  143 QRS Duration:  122 QT Interval:  390 QTC Calculation: 445 R Axis:   -40  Text Interpretation: paced rhythm Left atrial enlargement RBBB and LAFB Confirmed by Lenor Hollering [phone]) on 10/03/2024 5:25:02 PM  Radiology: US  Abdomen Limited RUQ (LIVER/GB) Result Date: 10/03/2024 CLINICAL DATA:  Right upper quadrant pain, diarrhea EXAM: ULTRASOUND ABDOMEN LIMITED RIGHT UPPER QUADRANT COMPARISON:  10/03/2024 FINDINGS: Gallbladder: Gallbladder is markedly distended, measuring up to 11.8 cm in length. Multiple shadowing gallstones are seen within the gallbladder lumen, largest measuring 2.8 cm. No gallbladder wall thickening or pericholecystic fluid. Negative sonographic Murphy sign. Common bile duct: Diameter: 12 mm Liver: No focal lesion identified. Within normal limits in parenchymal echogenicity. Portal vein is patent on color Doppler imaging with normal direction of blood flow towards the liver. Other: None. IMPRESSION: 1. Marked gallbladder distension with large shadowing gallstones. No sonographic evidence of acute cholecystitis. 2. Dilated common bile duct with no evidence of intrahepatic duct dilation or choledocholithiasis. Electronically Signed   By: Ozell Daring M.D.   On: 10/03/2024 20:50   CT ABDOMEN PELVIS WO CONTRAST Result Date: 10/03/2024 EXAM: CT ABDOMEN AND PELVIS WITHOUT CONTRAST 10/03/2024 07:16:22 PM TECHNIQUE: CT of the abdomen and pelvis was performed without the administration of intravenous contrast. Multiplanar reformatted images are provided for review. Automated exposure control, iterative reconstruction, and/or weight-based adjustment of the mA/kV was utilized to reduce the radiation dose to as low as reasonably achievable. COMPARISON: None available. CLINICAL HISTORY: LLQ abdominal pain. FINDINGS: LOWER CHEST: No acute abnormality. LIVER: The liver is unremarkable. GALLBLADDER AND BILE DUCTS: Gallbladder is dilated.  Gallstones are present. There is no biliary ductal dilatation. SPLEEN: No acute abnormality. PANCREAS: There is diffuse pancreatic atrophy. ADRENAL GLANDS: No acute abnormality. KIDNEYS, URETERS AND BLADDER: Bilateral renal cysts are present. The largest is in the right kidney measuring 3.4 cm. Subcentimeter mildly hyperdense area is seen in the right kidney which may represent a complex cyst, but is indeterminate. No stones in the kidneys or ureters. No hydronephrosis. No perinephric or periureteral stranding. The bladder is markedly distended. GI AND BOWEL: Stomach demonstrates no acute abnormality. There is diffuse colonic wall thickening. There are scattered colonic diverticula. The appendix is not visualized. There is no bowel obstruction. PERITONEUM AND RETROPERITONEUM: No ascites. No free air. VASCULATURE: Aorta is normal in caliber. There are atherosclerotic calcifications of the aorta and iliac arteries. LYMPH NODES: No lymphadenopathy. REPRODUCTIVE ORGANS: Uterus not well seen. BONES AND SOFT TISSUES: Right hip arthroplasty present. No acute osseous abnormality. No focal soft tissue abnormality. IMPRESSION: 1. Gallbladder dilation with gallstones, without biliary ductal dilatation. Correlate clinically for acute or chronic cholecystitis. 2. Diffuse colonic wall thickening  concerning for diffuse nonspecific colitis. 3. Bilateral renal cysts, largest in the right kidney measuring 3.4 cm, and a subcentimeter mildly hyperdense right renal lesion which is indeterminate; recommend renal protocol MRI (preferred) or CT without and with contrast in 6-12 months for characterization. Electronically signed by: Greig Pique MD MD 10/03/2024 07:42 PM EST RP Workstation: [id number]   DG Chest 2 View Result Date: 10/03/2024 CLINICAL DATA:  Cough, recent COVID diagnosis, weakness, history of cardiac transplant EXAM: CHEST - 2 VIEW COMPARISON:  09/21/2024 FINDINGS: Frontal and lateral views of the chest demonstrates  stable postsurgical changes from median sternotomy. Dual lead cardiac  pacer unchanged. Cardiac silhouette is unremarkable. No acute airspace disease, effusion, or pneumothorax. No acute bony abnormalities. IMPRESSION: 1. No acute intrathoracic process. Electronically Signed   By: Ozell Daring M.D.   On: 10/03/2024 14:40      Medications Ordered in the ED  lactated ringers  bolus 1,000 mL (0 mLs Intravenous Stopped 10/03/24 1837)  acetaminophen  (TYLENOL ) tablet 650 mg (650 mg Oral Given 10/03/24 1837)  amoxicillin -clavulanate (AUGMENTIN ) 875-125 MG per tablet 1 tablet (1 tablet Oral Given 10/03/24 2124)    Clinical Course as of 10/03/24 2127  Fri Oct 03, 2024  1802 Troponin T High Sensitivity(!): 35 Downtrending 37->35 [LB]  1803 WBC(!): 13.5 No lactic acidosis [LB]  1804 Temp: 99.8 F (37.7 C) First temperature 99.5 previous Fahrenheit and increased to 99.8 F in 4 hours.  Suspect that infection may be due to either gastroenteritis, flu.  XR wo PNA. Will obtain respiratory panel. Provided tylenol . No transaminitis on labs [LB]  2056 US  Abdomen Limited RUQ (LIVER/GB) Shows CBD dilation with gallstones. No transaminits nor signs of obstruction. No specific localized tenderness to RUQ. No signs of cholecystitis. No NV. Provided general surgery f/u [LB]  2126 C Difficile Quick Screen w PCR reflex Attempted to get stool sample from patient however patient was unable to provide stool sample over the past 9 hours while in ED.  I discussed that she can provide stool sample to PCP, GI to rule out infectious etiology.  Does not sound consistent with C. difficile [LB]    Clinical Course User Index [LB] Minnie Tinnie BRAVO, PA   {Click here for ABCD2, HEART and other calculators REFRESH Note before signing:1}                              Medical Decision Making Amount and/or Complexity of Data Reviewed Labs:  Decision-making details documented in ED Course. Radiology: ordered. Decision-making details  documented in ED Course.  Risk OTC drugs. Prescription drug management.   Patient presents to the ED for concern of diarrhea, decreased appetite, weakness, this involves an extensive number of treatment options, and is a complaint that carries with it a high risk of complications and morbidity.  The differential diagnosis includes COVID, flu, RSV, viral infection, gastroenteritis, dehydration, kidney infection, ACS, bacterial diarrhea, symptomatic anemia, UTI, electrolyte abnormality, CVA/TIA   Co morbidities that complicate the patient evaluation  See hpi   Additional history obtained:  Additional history obtained from Nursing, Outside Medical Records, and Past Admission   External records from outside source obtained and reviewed including triage RN note, GI note from 2024, recent 1 day admission for sepsis 2/2 UTI, COVID   Lab Tests:  I Ordered, and personally interpreted labs.  The pertinent results include:   WBC 13.5 Hgb 10.9 PLT 432 Creatinine 1.64 BUN 34 Lipase WNL Troponin 35   Imaging Studies ordered:  I ordered imaging studies including CT abd pelvis wo contrast  I independently visualized and interpreted imaging which showed  Gallbladder dilation with gallstones, without biliary ductal dilatation. Correlate clinically for acute or chronic cholecystitis. Diffuse colonic wall thickening  concerning for diffuse nonspecific colitis. Bilateral renal cysts, largest in the right kidney measuring 3.4 cm, and a subcentimeter mildly hyperdense right renal lesion which is indeterminate; recommend renal protocol MRI (preferred) or CT without and with contrast in 6-12 months for characterization I agree with the radiologist interpretation   Cardiac Monitoring:  The patient was maintained on a cardiac  monitor.  I personally viewed and interpreted the cardiac monitored which showed an underlying rhythm of: paced rhythm with no signs of ischemia   Medicines ordered and  prescription drug management:  I ordered medication including tylenol , LR  for hydration, pain, temperature  Reevaluation of the patient after these medicines showed that the patient improved I have reviewed the patients home medicines and have made adjustments as needed    Problem List / ED Course:  Diarrhea Colitis  This seems to be a chronic issue however she reports this has been worsening over the past day causing her to use depends as she has been having some stool leaking out of her.  No back pain, saddle paresthesia, worsening urinary incontinence.  Patient does have urinary incontinence at baseline from OAB and this is not new for her.  Does not sound like cauda equina nor CNS symptoms of Abd tenderness to LLQ and suprapubic regions. No radiation into back. No HTN. Low suspicion for dissection CT notable for GB dilation, possible acute vs chronic cholecystitis.  RUQ US  notable for CBD dilatation and gallstones wo cholecytitis Provided 1 dose of Augmentin  here Emergency Department.  Patient has had Augmentin  many times in the past without complication.  I offered patient admission as she is a high risk patient on immunosuppressive medicine and heart transplant history.  Patient wishes to go home and try outpatient management first.  Provided prescription.  Discussed antibiotic stewardship and strict return precautions  Weakness No focal deficits on exam.  No visual nor speech deficits.  No agnosia.  No AMS.  No dizziness.  Low suspicion for CVA/TIA Hx of heart transplant. Last echo 03/18/2024 with [facility]ology notable for LVEF of 55%.  No signs of fluid overload on exam with no pedal edema.  No effusion on chest x-ray.  Will provide patient with some fluids as she has been having increased diarrhea for hydration Hgb 10.9. was 10.5 eleven days ago. No reports of blood in stool nor melena. VS hemodynamically stable Troponin 37->35 and downtrending Resp panel pending UA wo  infection   Gallstones CBD dilation No signs of obstruction nor cholecystis on US  No transaminitis Believe this was incidental finding as she did not have localized tenderness to RUQ and does not appear to have acute biliary colic or infection Will provide general surgery follow up    Reevaluation:  After the interventions noted above, I reevaluated the patient and found that they have :stayed the same    Dispostion:  After consideration of the diagnostic results and the patients response to treatment, I feel that the patent would benefit from outpatient management with general surgery, GI follow up.   Discussed ED workup, disposition, return to ED precautions with patient who expresses understanding agrees with plan.  All questions answered to their satisfaction.  They are agreeable to plan.  Discharge instructions provided on paperwork  Final diagnoses:  Colitis  Diarrhea, unspecified type  Gallstones and inflammation of gallbladder without obstruction    ED Discharge Orders          Ordered    amoxicillin -clavulanate (AUGMENTIN ) 875-125 MG tablet  Every 12 hours        10/03/24 2102           "

## 2024-10-08 NOTE — Telephone Encounter (Signed)
 [facility] [employer]  Patient is ready for scheduling on or after: 10/13/24  Out-of-pocket cost due at time of visit: $0  Primary: [place]  Medicare [employer]  co-insurance: 20% (approximately $352.56) Admin fee co-insurance: 20% (approximately $25)  Deductible: $283 ($0 met)  Prior Auth: NOT required  Secondary: [employer] med supp this plan is a Medicare Supplement Plan F and it covers the Medicare Part B deductible, co-insurance and 100% of the excess charges   ** This summary of benefits is an estimation of the patient's out-of-pocket cost. Exact cost may vary based on individual plan coverage.

## 2024-10-14 ENCOUNTER — Ambulatory Visit: Admitting: Family Medicine

## 2024-10-30 ENCOUNTER — Other Ambulatory Visit: Payer: Self-pay | Admitting: *Deleted

## 2024-10-30 DIAGNOSIS — M810 Age-related osteoporosis without current pathological fracture: Secondary | ICD-10-CM

## 2024-11-05 ENCOUNTER — Ambulatory Visit: Admitting: Family Medicine
